# Patient Record
Sex: Female | Born: 1980 | Race: Black or African American | Hispanic: No | Marital: Married | Smoking: Never smoker
Health system: Southern US, Community
[De-identification: ages and names within clinical notes are randomized; demographics above are authoritative.]

## PROBLEM LIST (undated history)

## (undated) ENCOUNTER — Inpatient Hospital Stay (HOSPITAL_COMMUNITY): Payer: Self-pay

## (undated) DIAGNOSIS — Z789 Other specified health status: Secondary | ICD-10-CM

## (undated) DIAGNOSIS — N883 Incompetence of cervix uteri: Secondary | ICD-10-CM

## (undated) DIAGNOSIS — O139 Gestational [pregnancy-induced] hypertension without significant proteinuria, unspecified trimester: Secondary | ICD-10-CM

## (undated) DIAGNOSIS — D563 Thalassemia minor: Secondary | ICD-10-CM

## (undated) DIAGNOSIS — D649 Anemia, unspecified: Secondary | ICD-10-CM

## (undated) DIAGNOSIS — O009 Unspecified ectopic pregnancy without intrauterine pregnancy: Secondary | ICD-10-CM

## (undated) DIAGNOSIS — O039 Complete or unspecified spontaneous abortion without complication: Secondary | ICD-10-CM

## (undated) HISTORY — PX: TUBAL LIGATION: SHX77

## (undated) HISTORY — PX: WISDOM TOOTH EXTRACTION: SHX21

## (undated) HISTORY — PX: ABDOMINOPLASTY: SUR9

## (undated) HISTORY — PX: OTHER SURGICAL HISTORY: SHX169

## (undated) HISTORY — DX: Complete or unspecified spontaneous abortion without complication: O03.9

---
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
## 2000-02-18 ENCOUNTER — Inpatient Hospital Stay (HOSPITAL_COMMUNITY): Admission: AD | Admit: 2000-02-18 | Discharge: 2000-02-18 | Payer: Self-pay | Admitting: *Deleted

## 2000-02-27 ENCOUNTER — Inpatient Hospital Stay (HOSPITAL_COMMUNITY): Admission: AD | Admit: 2000-02-27 | Discharge: 2000-02-27 | Payer: Self-pay | Admitting: *Deleted

## 2000-03-13 ENCOUNTER — Ambulatory Visit (HOSPITAL_COMMUNITY): Admission: RE | Admit: 2000-03-13 | Discharge: 2000-03-13 | Payer: Self-pay | Admitting: *Deleted

## 2000-03-13 ENCOUNTER — Encounter: Payer: Self-pay | Admitting: *Deleted

## 2000-03-29 ENCOUNTER — Inpatient Hospital Stay (HOSPITAL_COMMUNITY): Admission: AD | Admit: 2000-03-29 | Discharge: 2000-03-29 | Payer: Self-pay | Admitting: *Deleted

## 2000-05-21 ENCOUNTER — Inpatient Hospital Stay (HOSPITAL_COMMUNITY): Admission: AD | Admit: 2000-05-21 | Discharge: 2000-05-21 | Payer: Self-pay | Admitting: *Deleted

## 2000-05-22 ENCOUNTER — Ambulatory Visit (HOSPITAL_COMMUNITY): Admission: RE | Admit: 2000-05-22 | Discharge: 2000-05-22 | Payer: Self-pay | Admitting: *Deleted

## 2000-05-22 ENCOUNTER — Encounter: Payer: Self-pay | Admitting: *Deleted

## 2000-07-18 ENCOUNTER — Encounter (INDEPENDENT_AMBULATORY_CARE_PROVIDER_SITE_OTHER): Payer: Self-pay | Admitting: Specialist

## 2000-07-18 ENCOUNTER — Inpatient Hospital Stay (HOSPITAL_COMMUNITY): Admission: AD | Admit: 2000-07-18 | Discharge: 2000-07-22 | Payer: Self-pay | Admitting: *Deleted

## 2000-07-18 DIAGNOSIS — O321XX0 Maternal care for breech presentation, not applicable or unspecified: Secondary | ICD-10-CM

## 2001-01-20 ENCOUNTER — Inpatient Hospital Stay (HOSPITAL_COMMUNITY): Admission: AD | Admit: 2001-01-20 | Discharge: 2001-01-20 | Payer: Self-pay | Admitting: *Deleted

## 2001-06-17 ENCOUNTER — Emergency Department (HOSPITAL_COMMUNITY): Admission: EM | Admit: 2001-06-17 | Discharge: 2001-06-17 | Payer: Self-pay | Admitting: Emergency Medicine

## 2001-06-17 ENCOUNTER — Encounter: Payer: Self-pay | Admitting: Emergency Medicine

## 2001-09-13 ENCOUNTER — Emergency Department (HOSPITAL_COMMUNITY): Admission: EM | Admit: 2001-09-13 | Discharge: 2001-09-13 | Payer: Self-pay

## 2002-06-28 ENCOUNTER — Emergency Department (HOSPITAL_COMMUNITY): Admission: EM | Admit: 2002-06-28 | Discharge: 2002-06-28 | Payer: Self-pay | Admitting: Emergency Medicine

## 2003-03-29 ENCOUNTER — Inpatient Hospital Stay (HOSPITAL_COMMUNITY): Admission: AD | Admit: 2003-03-29 | Discharge: 2003-03-30 | Payer: Self-pay | Admitting: Obstetrics

## 2003-05-09 ENCOUNTER — Inpatient Hospital Stay (HOSPITAL_COMMUNITY): Admission: AD | Admit: 2003-05-09 | Discharge: 2003-05-09 | Payer: Self-pay | Admitting: Obstetrics

## 2003-05-10 ENCOUNTER — Inpatient Hospital Stay (HOSPITAL_COMMUNITY): Admission: AD | Admit: 2003-05-10 | Discharge: 2003-05-10 | Payer: Self-pay | Admitting: Obstetrics

## 2003-05-17 ENCOUNTER — Inpatient Hospital Stay (HOSPITAL_COMMUNITY): Admission: AD | Admit: 2003-05-17 | Discharge: 2003-05-17 | Payer: Self-pay | Admitting: Obstetrics

## 2003-06-04 ENCOUNTER — Inpatient Hospital Stay (HOSPITAL_COMMUNITY): Admission: AD | Admit: 2003-06-04 | Discharge: 2003-06-04 | Payer: Self-pay | Admitting: Obstetrics

## 2003-08-25 ENCOUNTER — Inpatient Hospital Stay (HOSPITAL_COMMUNITY): Admission: AD | Admit: 2003-08-25 | Discharge: 2003-08-25 | Payer: Self-pay | Admitting: Obstetrics

## 2003-09-03 ENCOUNTER — Inpatient Hospital Stay (HOSPITAL_COMMUNITY): Admission: AD | Admit: 2003-09-03 | Discharge: 2003-09-03 | Payer: Self-pay | Admitting: Obstetrics

## 2003-09-11 ENCOUNTER — Encounter (INDEPENDENT_AMBULATORY_CARE_PROVIDER_SITE_OTHER): Payer: Self-pay | Admitting: Specialist

## 2003-09-11 ENCOUNTER — Inpatient Hospital Stay (HOSPITAL_COMMUNITY): Admission: AD | Admit: 2003-09-11 | Discharge: 2003-09-13 | Payer: Self-pay | Admitting: Obstetrics

## 2004-03-07 ENCOUNTER — Inpatient Hospital Stay (HOSPITAL_COMMUNITY): Admission: AD | Admit: 2004-03-07 | Discharge: 2004-03-07 | Payer: Self-pay | Admitting: Obstetrics

## 2004-07-07 ENCOUNTER — Inpatient Hospital Stay (HOSPITAL_COMMUNITY): Admission: AD | Admit: 2004-07-07 | Discharge: 2004-07-07 | Payer: Self-pay | Admitting: Obstetrics

## 2004-07-08 ENCOUNTER — Inpatient Hospital Stay (HOSPITAL_COMMUNITY): Admission: AD | Admit: 2004-07-08 | Discharge: 2004-07-08 | Payer: Self-pay | Admitting: Obstetrics

## 2007-02-03 ENCOUNTER — Inpatient Hospital Stay (HOSPITAL_COMMUNITY): Admission: AD | Admit: 2007-02-03 | Discharge: 2007-02-03 | Payer: Self-pay | Admitting: Obstetrics

## 2010-06-21 ENCOUNTER — Encounter: Admission: RE | Admit: 2010-06-21 | Discharge: 2010-06-21 | Payer: Self-pay | Admitting: General Surgery

## 2010-12-22 NOTE — Discharge Summary (Signed)
NAME:  Rebecca Webster, Rebecca Webster                        ACCOUNT NO.:  [id number]   MEDICAL RECORD NO.:  [id number]                   PATIENT TYPE:  INP   LOCATION:  9147                                 FACILITY:  [facility]   PHYSICIAN:  Kathreen Cosier, M.D.           DATE OF BIRTH:  01-Feb-1981   DATE OF ADMISSION:  09/11/2003  DATE OF DISCHARGE:  09/13/2003                                 DISCHARGE SUMMARY   Patient is a 30 year old gravida 5 para 2-2-0-4 (EDC September 10, 2003).  She  was in brought in for elective induction at term.  She has a negative GBS.  Her cervix was 2 cm, 60%, vertex, -2 on admission and she had a C-section in  the past for twins.  Patient had a normal labor and normal vaginal delivery  of a female, Apgars 9 and 9.  She desired sterilization and underwent  sterilization on September 12, 2003.  She did well and was discharged home on  September 13, 2003 ambulatory, on a regular diet, hemoglobin 10.1, she was  discharged on Tylox for pain, to see me in 6 weeks.   DISCHARGE DIAGNOSIS:  Status post normal vaginal delivery post induction and  postpartum tubal ligation.                                               Kathreen Cosier, M.D.    BAM/MEDQ  D:  09/13/2003  T:  09/13/2003  Job:  [id number]

## 2010-12-22 NOTE — Op Note (Signed)
[facility]  Patient:    Rebecca Webster, Rebecca Webster                       MRN: [id number] Adm. Date:  [id number] Attending:  Deniece Ree                           Operative Report  PREOPERATIVE DIAGNOSIS:           Intrauterine pregnancy approximately 33 to [redacted] weeks gestation, twins, in active unstoppable labor with first infant being breech, second infant being in a transverse lie.  POSTOPERATIVE DIAGNOSIS:          Intrauterine pregnancy approximately 33 to [redacted] weeks gestation, twins, in active unstoppable labor with first infant being breech, second infant being in a transverse lie.  Viable twin A with an Apgar of 8 and 9 and twin B with an Apgar of 9 and 9.  Twin A was female and twin B was female.  OPERATION:                        Primary low transverse cervical cesarean section.  SURGEON:                          Deniece Ree, M.D.  ASSISTANT:                        Kathreen Cosier, M.D.  ANESTHESIA:                       Epidural by  Dr. ____________  Pediatric teaching service.  ESTIMATED BLOOD LOSS:             500 cc.  DRAINS:                           Foley catheter left to straight drainage.                                    The patient tolerated the procedure well and returned to the recovery room in satisfactory condition.  DESCRIPTION OF PROCEDURE:         The patient was taken to the operating room and prepped and draped in the usual fashion for a primary cesarean section.  A low Pfannenstiel incision was made.  This was carried down to the fascia at which time the fascia was entered and incised the extent of the incision. Midline identified and rectus muscles separated.  Abdominal peritoneum was then entered in the vertical fashion using Metzenbaum scissors.  The visceral peritoneum was incised bilaterally toward the round ligaments following which the lower uterine segment was scored, entered in the midline and  bluntly dissected open.  The right hand was introduced and in a breech delivery fashion, the infant was delivered without any problems.  The nose and pharynx was sucked out with a suction bulb.  Complete delivery was carried out.  The cord was clamped and the infant turned over to the pediatricians who were in attendance.  The second infant was somewhat difficult to deliver in that it was a fundal transverse lie.  However, with some manipulation, the amniotic sac was ruptured and with continuous manipulation, the infant was then  changed to a vertex presentation and delivered.  The nose and oropharynx were sucked out with the suction bulb.  At this point complete delivery was carried out without any problem.  Cord was clamped and the infant turned over the pediatricians who were in attendance.  Infant A was a female infant and infant B was a female.  Cord blood was then obtained from both placentas, labeled and sent to pathology.  The placenta as well as all products of conception were then manually removed from the uterine cavity, labeled and also sent to pathology.  After the uterus was cleaned manually, the myometrium was then closed with #1 chromic in a running locking stitch followed by a subcuticular stitch.  That was followed by an imbricating stitch, again using #1 chromic. Reperitonealization was then carried out using 2-0 chromic in a running stitch.  Sponge and needle count was correct x 2.  Hemostasis was present. Tubes and ovaries appeared to be within normal limits.  The abdominal peritoneum was then closed using 2-0 chromic in a running stitch followed by closure of the fascia using #1 Dexon in a running stitch.  The subcuticular stitch using 4-0 Vicryl was utilized to close the skin.  At this point the procedure was terminated.  The patient tolerated the procedure well and returned to the recovery room in satisfactory condition. DD:  07/18/00 TD:  07/18/00 Job:  [id number]

## 2010-12-22 NOTE — Discharge Summary (Signed)
[facility]  Patient:    Rebecca Webster, Rebecca Webster                       MRN: [id number] Adm. Date:  [id number] Disc. Date: [id number] Attending:  Deniece Ree                           Discharge Summary  DISCHARGE SUMMARY:            The patient was a 30 year old gravida 3, para 2, approximately 43 to [redacted] weeks gestation having twins. The patient was admitted in labor and started on magnesium sulfate for premature labor. The patient had been taking Terbutaline at home without any success. Contractions were continuing every three minutes. At the time her cervix was 3 cm dilated. The infant had a breech presentation. After the patient continued in this fashion, and unable to stop the labor, the patient was then scheduled for a cesarean section. Of the ultrasound, the first infant was breech and the second was transverse. On December 13 the patient underwent a primary low transverse cervical cesarean section at which time she had viable twins. The patient underwent the operative procedure very well without any problems.  Postoperatively, she did very well without any complications and was discharged on the fourth postoperative day. She was instructed on the possible complications and care following this type of surgery. She was told to return to my office in four weeks for followup evaluation or to call me prior to that time should any problems arise. DD:  09/04/00 TD:  09/04/00 Job: [id number]

## 2010-12-22 NOTE — Op Note (Signed)
NAME:  Rebecca Webster, Rebecca Webster                        ACCOUNT NO.:  [id number]   MEDICAL RECORD NO.:  [id number]                   PATIENT TYPE:  INP   LOCATION:  9147                                 FACILITY:  [facility]   PHYSICIAN:  Kathreen Cosier, M.D.           DATE OF BIRTH:  [birth year]   DATE OF PROCEDURE:  09/12/2003  DATE OF DISCHARGE:                                 OPERATIVE REPORT   PREOPERATIVE DIAGNOSIS:  Multiparity.   PROCEDURE:  Postpartum tubal ligation.   DESCRIPTION OF PROCEDURE:  The patient placed on the operating table in a  supine position; epidural was doses, abdomen prepped, draped, bladder  emptied with straight catheter.  Midline subumbilical incision 1 inch long  was made, carried down to the fascia.  Fascia was cleaned, grasped with 2  Kochers, and the fascia and the peritoneum opened with the Mayo scissors.  The left tube was grasped in the mid portion with Babcock clamp.  The tube  was traced to the fimbria.  A 0 plain suture was placed in the mesosalpinx  below the portion of the tube within the clamp.  This was tagged and  approximately 1 inch of tube transected.  Procedure done in exact fashion on  the other side.  Hemostasis satisfactory.  Lap and sponge counts correct.  Abdomen closed in layers.  Peritoneum and fascia continuous suture of 0  Dexon and the skin closed with subcuticular stitch of 4-0 Monocryl.  Blood  loss less than 5 mL.  The patient tolerated the procedure well, taken to the  recovery room in good condition.                                               Kathreen Cosier, M.D.    BAM/MEDQ  D:  09/12/2003  T:  09/12/2003  Job:  [id number]

## 2010-12-22 NOTE — H&P (Signed)
[facility]  Patient:    Rebecca Webster, Rebecca Webster                       MRN: [id number] Adm. Date:  [id number] Attending:  Deniece Ree                         History and Physical  HISTORY:                          The patient is a 30 year old gravida 3, para 2 approximately 38 to [redacted] weeks gestation with twins who was admitted because of uterine contractions.  Patient indicated they had been occurring for approximately six hours.  The patient had been previously taking terbutaline every four hours; however, no success with stopping the contractions at the present time.  At time of triage admission, the patient was having uterine contractions approximately every three to four minutes of very strong quality who was then admitted and placed on mag sulfate.  During the ensuing two hours, the patient had increasing uterine contractions of very strong quality without any change with the mag sulfate present.  Other than this, no other previous problem with a twin gestation.  PHYSICAL EXAMINATION:  GENERAL:                          The patient is a well-developed, well-nourished, gravid female in labor type distress.  HEENT:                            Within normal limits.  NECK:                             Supple.  BREASTS:                          Without masses, tenderness or discharge.  LUNGS:                            The lungs were clear to auscultation and percussion.  HEART:                            Normal sinus rhythm without murmurs, gallops or rubs.  ABDOMEN:                          Term size with good twin fetal heart tones.  EXTREMITIES:                      Within normal limits.  NEUROLOGIC:                       Within normal limits.  PELVIC:                           Cervix was 3 to 4 cm dilated, 90% effaced, a breach presentation at a -1 station.  An ultrasound was obtained which also revealed first baby to be a breech with the  second being a breech; however, in more of a transverse lie type position.  ADMISSION DIAGNOSES:  Intrauterine pregnancy twin gestation approximately 75 to [redacted] weeks gestation and unstoppable labor with a breech presentation.  Her plan is to proceed with a primary cesarean section. DD:  07/18/00 TD:  07/18/00 Job: [id number]

## 2011-05-23 LAB — WET PREP, GENITAL
Clue Cells Wet Prep HPF POC: NONE SEEN
Yeast Wet Prep HPF POC: NONE SEEN

## 2011-05-23 LAB — URINALYSIS, ROUTINE W REFLEX MICROSCOPIC
Bilirubin Urine: NEGATIVE
Glucose, UA: NEGATIVE
Hgb urine dipstick: NEGATIVE
Ketones, ur: NEGATIVE
Protein, ur: NEGATIVE
Specific Gravity, Urine: 1.01
Urobilinogen, UA: 0.2
pH: 6.5

## 2011-05-23 LAB — POCT PREGNANCY, URINE: Preg Test, Ur: NEGATIVE

## 2016-08-20 ENCOUNTER — Inpatient Hospital Stay (HOSPITAL_COMMUNITY)
Admission: AD | Admit: 2016-08-20 | Discharge: 2016-08-21 | Disposition: A | Discharge status: Home / Self Care | Payer: BLUE CROSS/BLUE SHIELD | Source: Ambulatory Visit | Attending: Family Medicine | Admitting: Family Medicine

## 2016-08-20 ENCOUNTER — Encounter (HOSPITAL_COMMUNITY): Payer: Self-pay | Admitting: *Deleted

## 2016-08-20 DIAGNOSIS — R102 Pelvic and perineal pain: Secondary | ICD-10-CM | POA: Diagnosis present

## 2016-08-20 DIAGNOSIS — N898 Other specified noninflammatory disorders of vagina: Secondary | ICD-10-CM | POA: Diagnosis not present

## 2016-08-20 DIAGNOSIS — Z3202 Encounter for pregnancy test, result negative: Secondary | ICD-10-CM | POA: Diagnosis not present

## 2016-08-20 DIAGNOSIS — N940 Mittelschmerz: Secondary | ICD-10-CM | POA: Diagnosis not present

## 2016-08-20 HISTORY — DX: Other specified health status: Z78.9

## 2016-08-20 LAB — WET PREP, GENITAL
CLUE CELLS WET PREP: NONE SEEN
Sperm: NONE SEEN
TRICH WET PREP: NONE SEEN
Yeast Wet Prep HPF POC: NONE SEEN

## 2016-08-20 LAB — URINALYSIS, ROUTINE W REFLEX MICROSCOPIC
Bilirubin Urine: NEGATIVE
Glucose, UA: NEGATIVE mg/dL
Hgb urine dipstick: NEGATIVE
KETONES UR: NEGATIVE mg/dL
LEUKOCYTES UA: NEGATIVE
Nitrite: NEGATIVE
PROTEIN: NEGATIVE mg/dL
Specific Gravity, Urine: 1.01 (ref 1.005–1.030)
pH: 7 (ref 5.0–8.0)

## 2016-08-20 LAB — POCT PREGNANCY, URINE: PREG TEST UR: NEGATIVE

## 2016-08-20 NOTE — MAU Provider Note (Signed)
History     CSN: [id number]  Arrival date and time: 08/20/16 2159   First Provider Initiated Contact with Patient 08/20/16 2328      Chief Complaint  Patient presents with  . Pelvic Pain   Pelvic Pain  The patient's primary symptoms include pelvic pain and vaginal discharge. This is a new problem. Episode onset: intermittently with cycle for about 4 months, but today it has been constant.  The problem has been gradually worsening. Pain severity now: 4/10  The problem affects the left side. She is not pregnant. Associated symptoms include vomiting (3x today ). Pertinent negatives include no chills, diarrhea, dysuria or fever. The vaginal discharge was watery and copious. There has been no bleeding. Exacerbated by: certain positions, leaning toward the left.  She has tried nothing for the symptoms. She is sexually active. Contraceptive use: had tubal reversal March 2017.  Her menstrual history has been regular (LMP: 08/10/16 ).   Past Medical History:  Diagnosis Date  . Medical history non-contributory     Past Surgical History:  Procedure Laterality Date  . abdominalplasty    . CESAREAN SECTION    . TUBAL LIGATION    . tubalr eversal      History reviewed. No pertinent family history.  Social History  Substance Use Topics  . Smoking status: Never Smoker  . Smokeless tobacco: Never Used  . Alcohol use No    Allergies: No Known Allergies  Prescriptions Prior to Admission  Medication Sig Dispense Refill Last Dose  . therapeutic multivitamin-minerals (THERAGRAN-M) tablet Take 1 tablet by mouth daily.   08/19/2016 at Unknown time    Review of Systems  Constitutional: Negative for chills and fever.  Gastrointestinal: Positive for vomiting (3x today ). Negative for diarrhea.  Genitourinary: Positive for pelvic pain and vaginal discharge. Negative for dysuria and vaginal bleeding.   Physical Exam   Blood pressure 133/84, pulse 97, temperature 99.9 F (37.7 C), temperature  source Oral, resp. rate 18, height 5' 10.5" (1.791 m), weight 209 lb (94.8 kg), last menstrual period 08/10/2016, SpO2 99 %.  Physical Exam  Nursing note and vitals reviewed. Constitutional: She is oriented to person, place, and time. She appears well-developed and well-nourished. No distress.  HENT:  Head: Normocephalic.  Cardiovascular: Normal rate.   Respiratory: Effort normal.  GI: Soft. There is no tenderness. There is no rebound.  Genitourinary:  Genitourinary Comments:  External: no lesion Vagina: normal  Cervix: pink, smooth, no CMT, copious egg white cervical mucous  Uterus: NSSC Adnexa: NT   Neurological: She is alert and oriented to person, place, and time.  Skin: Skin is warm and dry.  Psychiatric: She has a normal mood and affect.   Results for orders placed or performed during the hospital encounter of 08/20/16 (from the past 24 hour(s))  Urinalysis, Routine w reflex microscopic     Status: None   Collection Time: 08/20/16 10:52 PM  Result Value Ref Range   Color, Urine YELLOW YELLOW   APPearance CLEAR CLEAR   Specific Gravity, Urine 1.010 1.005 - 1.030   pH 7.0 5.0 - 8.0   Glucose, UA NEGATIVE NEGATIVE mg/dL   Hgb urine dipstick NEGATIVE NEGATIVE   Bilirubin Urine NEGATIVE NEGATIVE   Ketones, ur NEGATIVE NEGATIVE mg/dL   Protein, ur NEGATIVE NEGATIVE mg/dL   Nitrite NEGATIVE NEGATIVE   Leukocytes, UA NEGATIVE NEGATIVE  Pregnancy, urine POC     Status: None   Collection Time: 08/20/16 11:03 PM  Result Value Ref Range  Preg Test, Ur NEGATIVE NEGATIVE  Wet prep, genital     Status: Abnormal   Collection Time: 08/20/16 11:38 PM  Result Value Ref Range   Yeast Wet Prep HPF POC NONE SEEN NONE SEEN   Trich, Wet Prep NONE SEEN NONE SEEN   Clue Cells Wet Prep HPF POC NONE SEEN NONE SEEN   WBC, Wet Prep HPF POC FEW (A) NONE SEEN   Sperm NONE SEEN     MAU Course  Procedures  MDM   Assessment and Plan   1. Mittelschmerz phenomenon    DC home Comfort  measures reviewed  RX: none, OTC ibuprofen as needed  Return to MAU as needed FU with OB as planned  Follow-up Information    COUSINS,SHERONETTE A, MD Follow up.   Specialty:  Obstetrics and Gynecology Contact information: [address] [place] [address] [phone]            Tawnya Crook 08/20/2016, 11:30 PM

## 2016-08-20 NOTE — MAU Note (Signed)
Pt reports pain and pressure in her left lower quadrant. States the pain has been off/on for several months but for the last few days it has been constant. Also reports lower back pain and lower abd cramping , a headache and some vomiting for the last few days. Denies fever.

## 2016-08-21 DIAGNOSIS — N940 Mittelschmerz: Secondary | ICD-10-CM | POA: Diagnosis not present

## 2016-08-21 NOTE — Discharge Instructions (Signed)
Mittelschmerz Introduction Mittelschmerz is pain in the lower abdomen that is felt between periods.  The pain affects one side of the abdomen. The side that it affects may change from month to month.  The pain may be mild or severe.  The pain may last from minutes to hours. It does not last longer than 1-2 days.  The pain can happen with nausea and light vaginal bleeding. Mittelschmerz is common among women. It is caused by the growth and release of an egg from an ovary, and it is a natural part of the ovulation cycle. It often happens about two weeks after a woman's period ends. Follow these instructions at home: Pay attention to any changes in your condition. Take these actions to help with your pain:  Try soaking in a hot bath.  Take over-the-counter and prescription medicines only as told by your health care provider.  Keep all follow-up visits as told by your health care provider. This is important. Contact a health care provider if:  You have very bad pain most months.  You have abdominal pain that lasts longer than 24 hours.  Your pain medicine is not helping.  You have a fever.  You have nausea or vomiting that will not go away.  You miss your period.  You have vaginal bleeding between your periods that is heavier than spotting. This information is not intended to replace advice given to you by your health care provider. Make sure you discuss any questions you have with your health care provider. Document Released: 07/13/2002 Document Revised: 12/29/2015 Document Reviewed: 10/18/2014  2017 Elsevier

## 2016-08-23 LAB — GC/CHLAMYDIA PROBE AMP (~~LOC~~) NOT AT ARMC
Chlamydia: NEGATIVE
Neisseria Gonorrhea: NEGATIVE

## 2016-09-24 ENCOUNTER — Other Ambulatory Visit: Payer: Self-pay | Admitting: Obstetrics and Gynecology

## 2016-10-05 ENCOUNTER — Other Ambulatory Visit (HOSPITAL_COMMUNITY): Payer: Self-pay | Admitting: Obstetrics and Gynecology

## 2016-10-05 DIAGNOSIS — N979 Female infertility, unspecified: Secondary | ICD-10-CM

## 2016-10-10 ENCOUNTER — Ambulatory Visit (HOSPITAL_COMMUNITY)
Admission: RE | Admit: 2016-10-10 | Discharge: 2016-10-10 | Disposition: A | Discharge status: Home / Self Care | Payer: BLUE CROSS/BLUE SHIELD | Source: Ambulatory Visit | Attending: Obstetrics and Gynecology | Admitting: Obstetrics and Gynecology

## 2016-10-10 ENCOUNTER — Encounter (HOSPITAL_COMMUNITY): Payer: Self-pay | Admitting: Radiology

## 2016-10-10 DIAGNOSIS — N979 Female infertility, unspecified: Secondary | ICD-10-CM

## 2016-10-10 DIAGNOSIS — Z3141 Encounter for fertility testing: Secondary | ICD-10-CM | POA: Insufficient documentation

## 2016-10-10 DIAGNOSIS — Z9851 Tubal ligation status: Secondary | ICD-10-CM | POA: Diagnosis present

## 2016-10-10 MED ORDER — IOPAMIDOL (ISOVUE-300) INJECTION 61%
30.0000 mL | Freq: Once | INTRAVENOUS | Status: AC | PRN
  Administered 2016-10-10: 30 mL

## 2017-12-19 ENCOUNTER — Encounter (HOSPITAL_COMMUNITY): Payer: Self-pay | Admitting: *Deleted

## 2017-12-19 ENCOUNTER — Inpatient Hospital Stay (HOSPITAL_COMMUNITY)
Admission: AD | Admit: 2017-12-19 | Discharge: 2017-12-19 | Discharge status: Against Medical Advice | Payer: 59 | Source: Ambulatory Visit | Attending: Obstetrics and Gynecology | Admitting: Obstetrics and Gynecology

## 2017-12-19 DIAGNOSIS — O26859 Spotting complicating pregnancy, unspecified trimester: Secondary | ICD-10-CM | POA: Insufficient documentation

## 2017-12-19 DIAGNOSIS — Z3A01 Less than 8 weeks gestation of pregnancy: Secondary | ICD-10-CM | POA: Diagnosis not present

## 2017-12-19 DIAGNOSIS — O4691 Antepartum hemorrhage, unspecified, first trimester: Secondary | ICD-10-CM | POA: Diagnosis present

## 2017-12-19 LAB — URINALYSIS, ROUTINE W REFLEX MICROSCOPIC
Bilirubin Urine: NEGATIVE
GLUCOSE, UA: NEGATIVE mg/dL
Hgb urine dipstick: NEGATIVE
Ketones, ur: NEGATIVE mg/dL
LEUKOCYTES UA: NEGATIVE
Nitrite: NEGATIVE
PH: 6 (ref 5.0–8.0)
PROTEIN: NEGATIVE mg/dL
Specific Gravity, Urine: 1.004 — ABNORMAL LOW (ref 1.005–1.030)

## 2017-12-19 LAB — POCT PREGNANCY, URINE: Preg Test, Ur: POSITIVE — AB

## 2017-12-19 NOTE — MAU Note (Signed)
Pt stated she started spotting yesterday thought it stopped today but then started having some more abut 30 min ago. Denies any pain or cramping. La intercourse yesterday.

## 2017-12-20 ENCOUNTER — Encounter (HOSPITAL_COMMUNITY): Payer: Self-pay | Admitting: *Deleted

## 2017-12-20 ENCOUNTER — Inpatient Hospital Stay (HOSPITAL_COMMUNITY)
Admission: AD | Admit: 2017-12-20 | Discharge: 2017-12-20 | Disposition: A | Discharge status: Home / Self Care | Payer: 59 | Source: Ambulatory Visit | Attending: Obstetrics and Gynecology | Admitting: Obstetrics and Gynecology

## 2017-12-20 ENCOUNTER — Other Ambulatory Visit: Payer: Self-pay | Admitting: Obstetrics and Gynecology

## 2017-12-20 DIAGNOSIS — O009 Unspecified ectopic pregnancy without intrauterine pregnancy: Secondary | ICD-10-CM | POA: Diagnosis not present

## 2017-12-20 LAB — CBC WITH DIFFERENTIAL/PLATELET
Basophils Absolute: 0 10*3/uL (ref 0.0–0.1)
Basophils Relative: 0 %
EOS PCT: 1 %
Eosinophils Absolute: 0.1 10*3/uL (ref 0.0–0.7)
HCT: 39.2 % (ref 36.0–46.0)
Hemoglobin: 12.4 g/dL (ref 12.0–15.0)
LYMPHS PCT: 37 %
Lymphs Abs: 2.5 10*3/uL (ref 0.7–4.0)
MCH: 23.4 pg — ABNORMAL LOW (ref 26.0–34.0)
MCHC: 31.6 g/dL (ref 30.0–36.0)
MCV: 74 fL — AB (ref 78.0–100.0)
MONO ABS: 0.2 10*3/uL (ref 0.1–1.0)
Monocytes Relative: 4 %
Neutro Abs: 3.8 10*3/uL (ref 1.7–7.7)
Neutrophils Relative %: 58 %
PLATELETS: 221 10*3/uL (ref 150–400)
RBC: 5.3 MIL/uL — ABNORMAL HIGH (ref 3.87–5.11)
RDW: 15.4 % (ref 11.5–15.5)
WBC: 6.6 10*3/uL (ref 4.0–10.5)

## 2017-12-20 LAB — ABO/RH: ABO/RH(D): O POS

## 2017-12-20 LAB — CREATININE, SERUM
Creatinine, Ser: 0.59 mg/dL (ref 0.44–1.00)
GFR calc non Af Amer: >60 mL/min (ref >60)

## 2017-12-20 LAB — TYPE AND SCREEN
ABO/RH(D): O POS
Antibody Screen: NEGATIVE

## 2017-12-20 LAB — AST: AST: 18 U/L (ref 15–41)

## 2017-12-20 LAB — BUN: BUN: 9 mg/dL (ref 6–20)

## 2017-12-20 MED ORDER — METHOTREXATE INJECTION FOR WOMEN'S HOSPITAL
50.0000 mg/m2 | Freq: Once | INTRAMUSCULAR | Status: AC
  Administered 2017-12-20: 110 mg via INTRAMUSCULAR
  Filled 2017-12-20: qty 2.2

## 2017-12-20 NOTE — Progress Notes (Signed)
Dr Billy Coast called and message left that labs all wnl and request MD to call back to ok giving mtx

## 2017-12-20 NOTE — MAU Note (Signed)
Dr Cherly Hensen sent me in for methotrexate for ectopic. Denies pain and no bleeding. MTX info sheet given

## 2017-12-20 NOTE — MAU Note (Signed)
MTX given and questions answered. Pt to lobby to wait and then d/c home with significant other. Will call office Monday am for f/u appt in office. To return to MAU over wkend for any concerns. Understanding voiced

## 2017-12-20 NOTE — Progress Notes (Signed)
Dr Billy Coast called in to MAU and aware of lab results. Order given to give MTX

## 2018-11-13 ENCOUNTER — Encounter (HOSPITAL_COMMUNITY): Payer: Self-pay

## 2019-05-05 ENCOUNTER — Encounter (HOSPITAL_COMMUNITY): Payer: Self-pay | Admitting: *Deleted

## 2019-05-05 ENCOUNTER — Inpatient Hospital Stay (HOSPITAL_COMMUNITY)
Admission: AD | Admit: 2019-05-05 | Discharge: 2019-05-09 | DRG: 817 | Disposition: A | Discharge status: Home / Self Care | Payer: 59 | Attending: Obstetrics and Gynecology | Admitting: Obstetrics and Gynecology

## 2019-05-05 ENCOUNTER — Other Ambulatory Visit: Payer: Self-pay

## 2019-05-05 DIAGNOSIS — O34219 Maternal care for unspecified type scar from previous cesarean delivery: Secondary | ICD-10-CM | POA: Diagnosis present

## 2019-05-05 DIAGNOSIS — O4290 Premature rupture of membranes, unspecified as to length of time between rupture and onset of labor, unspecified weeks of gestation: Secondary | ICD-10-CM

## 2019-05-05 DIAGNOSIS — Z3A18 18 weeks gestation of pregnancy: Secondary | ICD-10-CM

## 2019-05-05 DIAGNOSIS — O3432 Maternal care for cervical incompetence, second trimester: Principal | ICD-10-CM | POA: Diagnosis present

## 2019-05-05 DIAGNOSIS — U071 COVID-19: Secondary | ICD-10-CM | POA: Diagnosis present

## 2019-05-05 DIAGNOSIS — O98512 Other viral diseases complicating pregnancy, second trimester: Secondary | ICD-10-CM | POA: Diagnosis present

## 2019-05-05 LAB — WET PREP, GENITAL
Clue Cells Wet Prep HPF POC: NONE SEEN
Sperm: NONE SEEN
Trich, Wet Prep: NONE SEEN
Yeast Wet Prep HPF POC: NONE SEEN

## 2019-05-05 LAB — CBC WITH DIFFERENTIAL/PLATELET
Abs Immature Granulocytes: 0.35 10*3/uL — ABNORMAL HIGH (ref 0.00–0.07)
Basophils Absolute: 0 10*3/uL (ref 0.0–0.1)
Basophils Relative: 0 %
Eosinophils Absolute: 0.1 10*3/uL (ref 0.0–0.5)
Eosinophils Relative: 1 %
HCT: 35.1 % — ABNORMAL LOW (ref 36.0–46.0)
Hemoglobin: 11.4 g/dL — ABNORMAL LOW (ref 12.0–15.0)
Immature Granulocytes: 5 %
Lymphocytes Relative: 27 %
Lymphs Abs: 2.1 10*3/uL (ref 0.7–4.0)
MCH: 23.9 pg — ABNORMAL LOW (ref 26.0–34.0)
MCHC: 32.5 g/dL (ref 30.0–36.0)
MCV: 73.7 fL — ABNORMAL LOW (ref 80.0–100.0)
Monocytes Absolute: 0.7 10*3/uL (ref 0.1–1.0)
Monocytes Relative: 8 %
Neutro Abs: 4.5 10*3/uL (ref 1.7–7.7)
Neutrophils Relative %: 59 %
Platelets: 202 10*3/uL (ref 150–400)
RBC: 4.76 MIL/uL (ref 3.87–5.11)
RDW: 15 % (ref 11.5–15.5)
WBC: 7.7 10*3/uL (ref 4.0–10.5)
nRBC: 0 % (ref 0.0–0.2)

## 2019-05-05 LAB — URINALYSIS, ROUTINE W REFLEX MICROSCOPIC
Bilirubin Urine: NEGATIVE
Glucose, UA: NEGATIVE mg/dL
Hgb urine dipstick: NEGATIVE
Ketones, ur: NEGATIVE mg/dL
Leukocytes,Ua: NEGATIVE
Nitrite: NEGATIVE
Protein, ur: NEGATIVE mg/dL
Specific Gravity, Urine: 1.011 (ref 1.005–1.030)
pH: 6 (ref 5.0–8.0)

## 2019-05-05 LAB — TYPE AND SCREEN
ABO/RH(D): O POS
Antibody Screen: NEGATIVE

## 2019-05-05 LAB — GROUP B STREP BY PCR: Group B strep by PCR: NEGATIVE

## 2019-05-05 LAB — ABO/RH: ABO/RH(D): O POS

## 2019-05-05 MED ORDER — SODIUM CHLORIDE 0.9 % IV SOLN
2.0000 g | Freq: Four times a day (QID) | INTRAVENOUS | Status: DC
  Administered 2019-05-06 (×2): 2 g via INTRAVENOUS
  Filled 2019-05-05 (×2): qty 2000

## 2019-05-05 MED ORDER — DEXTROSE IN LACTATED RINGERS 5 % IV SOLN
INTRAVENOUS | Status: DC
  Administered 2019-05-05 – 2019-05-06 (×3): via INTRAVENOUS

## 2019-05-05 MED ORDER — ZOLPIDEM TARTRATE 5 MG PO TABS: 5.0000 mg | ORAL_TABLET | Freq: Every evening | ORAL | Status: DC | PRN

## 2019-05-05 MED ORDER — CALCIUM CARBONATE ANTACID 500 MG PO CHEW: 2.0000 | CHEWABLE_TABLET | ORAL | Status: DC | PRN

## 2019-05-05 MED ORDER — PRENATAL MULTIVITAMIN CH
1.0000 | ORAL_TABLET | Freq: Every day | ORAL | Status: DC
  Administered 2019-05-07 – 2019-05-08 (×2): 1 via ORAL
  Filled 2019-05-05 (×2): qty 1

## 2019-05-05 MED ORDER — ACETAMINOPHEN 325 MG PO TABS: 650.0000 mg | ORAL_TABLET | ORAL | Status: DC | PRN

## 2019-05-05 MED ORDER — INDOMETHACIN 50 MG RE SUPP
50.0000 mg | Freq: Once | RECTAL | Status: DC
  Filled 2019-05-05: qty 1

## 2019-05-05 MED ORDER — DOCUSATE SODIUM 100 MG PO CAPS
100.0000 mg | ORAL_CAPSULE | Freq: Every day | ORAL | Status: DC
  Administered 2019-05-07 – 2019-05-09 (×3): 100 mg via ORAL
  Filled 2019-05-05 (×3): qty 1

## 2019-05-05 NOTE — H&P (Signed)
Rebecca Webster is a 38 y.o. female presenting @ 18 2/[redacted] wks gestation with cervical incompetence noted on evaluation in MAU for complaint of watery vaginal discharge that become yellow today. Denies abdominal pain or cramping. Denies urinary sx. ? Hx years ago with cryo. OB History    Gravida  6   Para  4   Term  2   Preterm  2   AB  1   Living  5     SAB      TAB      Ectopic  1   Multiple      Live Births  5          Past Medical History:  Diagnosis Date  . Medical history non-contributory    Past Surgical History:  Procedure Laterality Date  . abdominalplasty    . CESAREAN SECTION    . TUBAL LIGATION    . tubalr eversal     Family History: family history is not on file. Social History:  reports that she has never smoked. She has never used smokeless tobacco. She reports that she does not drink alcohol or use drugs.     Maternal Diabetes: No Genetic Screening: Normal Maternal Ultrasounds/Referrals: Other:not done as yet Fetal Ultrasounds or other Referrals:  None Maternal Substance Abuse:  No Significant Maternal Medications:  None Significant Maternal Lab Results:  None Other Comments:  pevious PTB due to twins/ C/S  Review of Systems  All other systems reviewed and are negative.  History   Blood pressure 134/77, pulse 88, temperature 98.6 F (37 C), temperature source Oral, resp. rate 20, height 5\' 10"  (1.778 m), weight 108.7 kg, last menstrual period 12/28/2018, unknown if currently breastfeeding. Exam Physical Exam  Constitutional: She is oriented to person, place, and time. She appears well-developed and well-nourished.  Eyes: EOM are normal.  Neck: Neck supple.  Cardiovascular: Normal rate.  Respiratory: Effort normal.  GI: Soft. There is no abdominal tenderness.  Gravid~18 wk Tummy tuck  Genitourinary:    Genitourinary Comments: Scant clump yellow mucoid d/c Cervix with prolapsing membrane through ~1 cm dilation of cervix Uterus  gravid nontender 2 FB below umb   Musculoskeletal:        General: No edema.  Neurological: She is alert and oriented to person, place, and time.  Skin: Skin is warm and dry.    Prenatal labs: ABO, Rh:  O positive Antibody:  neg Rubella:  Immune  RPR:   NR HBsAg:   neg HIV:   neg GBS:   pending Wet prep done Assessment/Plan: Cervical incompetence IUP@ 18 2/7 weeks P) admit cbc, u/a, ucx. GBS cx . Formal CUS in am with MFM( r/o any fetal anomaly), rescue cervical cerclage. Monitor for ctx. Indocin on call to OR. Ampicillin on call to surgery. Bedrest with BRP. Reviewed risk of cerclage placement with pt and husband( infection, bleeding, ROM, inability to achieve viability, PTB)   Rebecca Webster A Rebecca Webster 05/05/2019, 10:56 PM

## 2019-05-05 NOTE — MAU Note (Signed)
PT SAYS SINCE Sunday - INCREASE IN VAG D/C.   TODAY WAS THICK YELLOW  THIS AM AND AFTERNOON.  SHE CALLED  DR COUSINS OFFICE -  GOT ANSWERING SERVICE.   NO PAIN.

## 2019-05-06 ENCOUNTER — Inpatient Hospital Stay (HOSPITAL_COMMUNITY): Payer: 59

## 2019-05-06 ENCOUNTER — Inpatient Hospital Stay (HOSPITAL_COMMUNITY): Payer: 59 | Admitting: Anesthesiology

## 2019-05-06 ENCOUNTER — Encounter (HOSPITAL_COMMUNITY)
Admission: AD | Disposition: A | Discharge status: Home / Self Care | Payer: Self-pay | Source: Home / Self Care | Attending: Obstetrics and Gynecology

## 2019-05-06 ENCOUNTER — Encounter (HOSPITAL_COMMUNITY): Payer: Self-pay | Admitting: Anesthesiology

## 2019-05-06 DIAGNOSIS — Z3A18 18 weeks gestation of pregnancy: Secondary | ICD-10-CM | POA: Diagnosis not present

## 2019-05-06 DIAGNOSIS — O09212 Supervision of pregnancy with history of pre-term labor, second trimester: Secondary | ICD-10-CM | POA: Diagnosis not present

## 2019-05-06 DIAGNOSIS — O09522 Supervision of elderly multigravida, second trimester: Secondary | ICD-10-CM | POA: Diagnosis not present

## 2019-05-06 DIAGNOSIS — U071 COVID-19: Secondary | ICD-10-CM | POA: Diagnosis present

## 2019-05-06 DIAGNOSIS — O3432 Maternal care for cervical incompetence, second trimester: Secondary | ICD-10-CM | POA: Diagnosis present

## 2019-05-06 DIAGNOSIS — O98512 Other viral diseases complicating pregnancy, second trimester: Secondary | ICD-10-CM | POA: Diagnosis present

## 2019-05-06 DIAGNOSIS — O34219 Maternal care for unspecified type scar from previous cesarean delivery: Secondary | ICD-10-CM | POA: Diagnosis present

## 2019-05-06 HISTORY — PX: CERVICAL CERCLAGE: SHX1329

## 2019-05-06 LAB — SARS CORONAVIRUS 2 BY RT PCR (HOSPITAL ORDER, PERFORMED IN ~~LOC~~ HOSPITAL LAB): SARS Coronavirus 2: POSITIVE — AB

## 2019-05-06 SURGERY — CERCLAGE, CERVIX, VAGINAL APPROACH
Anesthesia: Spinal | Site: Vagina | Wound class: Clean Contaminated

## 2019-05-06 MED ORDER — FAMOTIDINE IN NACL 20-0.9 MG/50ML-% IV SOLN
20.0000 mg | Freq: Once | INTRAVENOUS | Status: AC
  Administered 2019-05-06: 20 mg via INTRAVENOUS
  Filled 2019-05-06: qty 50

## 2019-05-06 MED ORDER — ONDANSETRON HCL 4 MG/2ML IJ SOLN
4.0000 mg | Freq: Once | INTRAMUSCULAR | Status: DC
  Filled 2019-05-06: qty 2

## 2019-05-06 MED ORDER — INDOMETHACIN 25 MG PO CAPS
25.0000 mg | ORAL_CAPSULE | Freq: Four times a day (QID) | ORAL | Status: AC
  Administered 2019-05-06 – 2019-05-08 (×8): 25 mg via ORAL
  Filled 2019-05-06 (×8): qty 1

## 2019-05-06 MED ORDER — CYCLOBENZAPRINE HCL 10 MG PO TABS
10.0000 mg | ORAL_TABLET | Freq: Three times a day (TID) | ORAL | Status: DC
  Administered 2019-05-06 – 2019-05-09 (×8): 10 mg via ORAL
  Filled 2019-05-06 (×8): qty 1

## 2019-05-06 MED ORDER — FENTANYL CITRATE (PF) 100 MCG/2ML IJ SOLN
INTRAMUSCULAR | Status: AC
  Filled 2019-05-06: qty 2

## 2019-05-06 MED ORDER — PROGESTERONE MICRONIZED 200 MG PO CAPS
200.0000 mg | ORAL_CAPSULE | Freq: Every day | ORAL | Status: DC
  Administered 2019-05-07 – 2019-05-09 (×3): 200 mg via VAGINAL
  Filled 2019-05-06 (×3): qty 1

## 2019-05-06 MED ORDER — ACETAMINOPHEN 160 MG/5ML PO SOLN: 325.0000 mg | Freq: Once | ORAL | Status: DC | PRN

## 2019-05-06 MED ORDER — INDOMETHACIN 50 MG RE SUPP
RECTAL | Status: DC | PRN
  Administered 2019-05-06: 50 mg via RECTAL

## 2019-05-06 MED ORDER — MEPERIDINE HCL 25 MG/ML IJ SOLN: 6.2500 mg | INTRAMUSCULAR | Status: DC | PRN

## 2019-05-06 MED ORDER — SOD CITRATE-CITRIC ACID 500-334 MG/5ML PO SOLN
30.0000 mL | Freq: Once | ORAL | Status: AC
  Administered 2019-05-06: 30 mL via ORAL
  Filled 2019-05-06: qty 30

## 2019-05-06 MED ORDER — ACETAMINOPHEN 10 MG/ML IV SOLN: 1000.0000 mg | Freq: Once | INTRAVENOUS | Status: DC | PRN

## 2019-05-06 MED ORDER — FENTANYL CITRATE (PF) 100 MCG/2ML IJ SOLN: 25.0000 ug | INTRAMUSCULAR | Status: DC | PRN

## 2019-05-06 MED ORDER — DEXTROSE IN LACTATED RINGERS 5 % IV SOLN
INTRAVENOUS | Status: DC
  Administered 2019-05-06: 21:00:00 via INTRAVENOUS

## 2019-05-06 MED ORDER — ONDANSETRON HCL 4 MG/2ML IJ SOLN
INTRAMUSCULAR | Status: AC
  Filled 2019-05-06: qty 2

## 2019-05-06 MED ORDER — LACTATED RINGERS IV SOLN: INTRAVENOUS | Status: DC

## 2019-05-06 MED ORDER — ACETAMINOPHEN 325 MG PO TABS: 325.0000 mg | ORAL_TABLET | Freq: Once | ORAL | Status: DC | PRN

## 2019-05-06 MED ORDER — ONDANSETRON HCL 4 MG/2ML IJ SOLN
INTRAMUSCULAR | Status: DC | PRN
  Administered 2019-05-06: 4 mg via INTRAVENOUS

## 2019-05-06 MED ORDER — LACTATED RINGERS IV SOLN
INTRAVENOUS | Status: DC | PRN
  Administered 2019-05-06: 17:00:00 via INTRAVENOUS

## 2019-05-06 MED ORDER — PROMETHAZINE HCL 25 MG/ML IJ SOLN: 6.2500 mg | INTRAMUSCULAR | Status: DC | PRN

## 2019-05-06 MED ORDER — INDOMETHACIN 50 MG RE SUPP
50.0000 mg | Freq: Once | RECTAL | Status: DC
  Filled 2019-05-06: qty 1

## 2019-05-06 MED ORDER — BUPIVACAINE IN DEXTROSE 0.75-8.25 % IT SOLN
INTRATHECAL | Status: DC | PRN
  Administered 2019-05-06: 1 mL via INTRATHECAL

## 2019-05-06 MED ORDER — FENTANYL CITRATE (PF) 100 MCG/2ML IJ SOLN
INTRAMUSCULAR | Status: DC | PRN
  Administered 2019-05-06: 15 ug via INTRATHECAL
  Administered 2019-05-06: 85 ug via INTRAVENOUS

## 2019-05-06 MED ORDER — POLYETHYLENE GLYCOL 3350 17 G PO PACK
17.0000 g | PACK | Freq: Every day | ORAL | Status: DC
  Administered 2019-05-06 – 2019-05-09 (×4): 17 g via ORAL
  Filled 2019-05-06 (×5): qty 1

## 2019-05-06 MED ORDER — SODIUM CHLORIDE 0.9 % IV SOLN
1.0000 g | Freq: Four times a day (QID) | INTRAVENOUS | Status: AC
  Administered 2019-05-06 – 2019-05-08 (×8): 1 g via INTRAVENOUS
  Filled 2019-05-06 (×9): qty 1000

## 2019-05-06 SURGICAL SUPPLY — 21 items
CANISTER SUCT 3000ML PPV (MISCELLANEOUS) ×3 IMPLANT
CATH FOLEY 2WAY SLVR 30CC 16FR (CATHETERS) ×6 IMPLANT
GLOVE BIOGEL PI IND STRL 7.0 (GLOVE) ×3 IMPLANT
GLOVE BIOGEL PI INDICATOR 7.0 (GLOVE) ×6
GLOVE ECLIPSE 6.5 STRL STRAW (GLOVE) ×3 IMPLANT
GOWN STRL REUS W/TWL LRG LVL3 (GOWN DISPOSABLE) ×6 IMPLANT
NS IRRIG 1000ML POUR BTL (IV SOLUTION) ×3 IMPLANT
PACK VAGINAL MINOR WOMEN LF (CUSTOM PROCEDURE TRAY) ×3 IMPLANT
PAD OB MATERNITY 4.3X12.25 (PERSONAL CARE ITEMS) ×3 IMPLANT
PAD PREP 24X48 CUFFED NSTRL (MISCELLANEOUS) ×3 IMPLANT
SUT ETHIBOND  5 (SUTURE) ×2
SUT ETHIBOND 5 (SUTURE) ×1 IMPLANT
SUT PROLENE 1 CTX 30  8455H (SUTURE) ×2
SUT PROLENE 1 CTX 30 8455H (SUTURE) ×1 IMPLANT
SYR 30ML LL (SYRINGE) ×2 IMPLANT
SYR BULB IRRIGATION 50ML (SYRINGE) ×3 IMPLANT
TOWEL OR 17X24 6PK STRL BLUE (TOWEL DISPOSABLE) ×6 IMPLANT
TRAY FOLEY W/BAG SLVR 14FR (SET/KITS/TRAYS/PACK) ×3 IMPLANT
TUBING NON-CON 1/4 X 20 CONN (TUBING) ×1 IMPLANT
TUBING NON-CON 1/4 X 20' CONN (TUBING) ×1
YANKAUER SUCT BULB TIP NO VENT (SUCTIONS) ×2 IMPLANT

## 2019-05-06 NOTE — Transfer of Care (Signed)
Immediate Anesthesia Transfer of Care Note  Patient: Rebecca Webster  Procedure(s) Performed: CERCLAGE CERVICAL (N/A Vagina )  Patient Location: PACU  Anesthesia Type:Spinal  Level of Consciousness: awake, alert  and oriented  Airway & Oxygen Therapy: Patient Spontanous Breathing  Post-op Assessment: Report given to RN and Post -op Vital signs reviewed and stable  Post vital signs: Reviewed and stable  Last Vitals:  Vitals Value Taken Time  BP    Temp    Pulse 78 05/06/19 1814  Resp 19 05/06/19 1814  SpO2 98 % 05/06/19 1814  Vitals shown include unvalidated device data.  Last Pain:  Vitals:   05/06/19 1555  TempSrc: Oral  PainSc:          Complications: No apparent anesthesia complications

## 2019-05-06 NOTE — Progress Notes (Signed)
Date and time results received: 05/06/19 0015   Test: COVID-19  Critical Value: Positive  Name of Provider Notified: Cousins  RN told pt and support person that pt is COVID positive. Explained PPE and need for support person to stay in room.

## 2019-05-06 NOTE — Anesthesia Preprocedure Evaluation (Addendum)
Anesthesia Evaluation  Patient identified by MRN, date of birth, ID band Patient awake    Reviewed: Allergy & Precautions, NPO status , Patient's Chart, lab work & pertinent test results  Airway Mallampati: II  TM Distance: >3 FB Neck ROM: Full    Dental  (+) Teeth Intact, Dental Advisory Given   Pulmonary neg pulmonary ROS,    breath sounds clear to auscultation       Cardiovascular negative cardio ROS   Rhythm:Regular Rate:Normal     Neuro/Psych negative neurological ROS  negative psych ROS   GI/Hepatic negative GI ROS, Neg liver ROS,   Endo/Other  negative endocrine ROS  Renal/GU negative Renal ROS     Musculoskeletal negative musculoskeletal ROS (+)   Abdominal   Peds  Hematology negative hematology ROS (+)   Anesthesia Other Findings   Reproductive/Obstetrics (+) Pregnancy                            Anesthesia Physical Anesthesia Plan  ASA: II  Anesthesia Plan: Spinal   Post-op Pain Management:    Induction:   PONV Risk Score and Plan: Ondansetron and Treatment may vary due to age or medical condition  Airway Management Planned:   Additional Equipment: None  Intra-op Plan:   Post-operative Plan:   Informed Consent: I have reviewed the patients History and Physical, chart, labs and discussed the procedure including the risks, benefits and alternatives for the proposed anesthesia with the patient or authorized representative who has indicated his/her understanding and acceptance.       Plan Discussed with: CRNA  Anesthesia Plan Comments: (Lab Results      Component                Value               Date                      WBC                      7.7                 05/05/2019                HGB                      11.4 (L)            05/05/2019                HCT                      35.1 (L)            05/05/2019                MCV                      73.7  (L)            05/05/2019                PLT                      202                 05/05/2019  COVID-19 Labs  No results for input(s): DDIMER, FERRITIN, LDH, CRP in the last 72 hours.  Lab Results      Component                Value               Date                      [id number]              POSITIVE (A)        05/05/2019            )       Anesthesia Quick Evaluation

## 2019-05-06 NOTE — Progress Notes (Signed)
HD #2 18 3/7 wk  S; no complaint except for the monitor belt uncomfortable Denies ctx/cramping  O: BP 139/78   Pulse 96   Temp 98.3 F (36.8 C) (Oral)   Resp 18   Ht 5\' 10"  (1.778 m)   Wt 108.7 kg   LMP 12/28/2018   SpO2 99%   BMI 34.38 kg/m  Labs reviewed with pt and partner: CBC    Component Value Date/Time   WBC 7.7 05/05/2019 2303   RBC 4.76 05/05/2019 2303   HGB 11.4 (L) 05/05/2019 2303   HCT 35.1 (L) 05/05/2019 2303   PLT 202 05/05/2019 2303   MCV 73.7 (L) 05/05/2019 2303   MCH 23.9 (L) 05/05/2019 2303   MCHC 32.5 05/05/2019 2303   RDW 15.0 05/05/2019 2303   LYMPHSABS 2.1 05/05/2019 2303   MONOABS 0.7 05/05/2019 2303   EOSABS 0.1 05/05/2019 2303   BASOSABS 0.0 05/05/2019 2303  Covid -19 positive( asymptomatic)  wet prep many wbc GBS cx negative  Toco: no ctx  sono done: results pending  IMP: cervical incompetence IUP@ 18 3/7 wk Covid-19 positive w/o sx P) rescue cerclage today. Consent signed

## 2019-05-06 NOTE — Anesthesia Procedure Notes (Signed)
Spinal  Start time: 05/06/2019 5:14 PM End time: 05/06/2019 5:16 PM Staffing Anesthesiologist: Effie Berkshire, MD Performed: anesthesiologist  Preanesthetic Checklist Completed: patient identified, site marked, surgical consent, pre-op evaluation, timeout performed, IV checked, risks and benefits discussed and monitors and equipment checked Spinal Block Patient position: sitting Prep: site prepped and draped and DuraPrep Location: L3-4 Injection technique: single-shot Needle Needle type: Pencan  Needle gauge: 24 G Needle length: 10 cm Needle insertion depth: 10 cm Additional Notes Patient tolerated well. No immediate complications.

## 2019-05-06 NOTE — Progress Notes (Signed)
Called to speak to pt regarding plan of care and need to not strain with BM. Spoke with nurse who reported that pt was complaining of low back pain ( 8/10). In speaking thereafter with the pt, she reports back pain prior to the surgery and hx back problems. Pt notes sx started since placed on bed rest. Advised pt to lay on sides and may use heating pad, tylenol for pain.will order trial of flexeril in the event of muscle spasms.

## 2019-05-06 NOTE — Brief Op Note (Signed)
05/06/2019  6:41 PM  PATIENT:  Rebecca Webster  38 y.o. female  PRE-OPERATIVE DIAGNOSIS:  incompetent cervix, IUP @ 18 3/7 weeks  POST-OPERATIVE DIAGNOSIS:  incompetent cervix, IUP @ 18 3/7 weeks  PROCEDURE:  Emergency Rescue McDonald cervical cerclage  SURGEON:  Surgeon(s) and Role:    * Servando Salina, MD - Primary  PHYSICIAN ASSISTANT:   ASSISTANTS: Derrell Lolling, CNM   ANESTHESIA:   spinal Findings. 4 cm dilated with bulging membrane( intact) EBL:  10 mL   BLOOD ADMINISTERED:none  DRAINS: none   LOCAL MEDICATIONS USED:  NONE  SPECIMEN:  No Specimen  DISPOSITION OF SPECIMEN:  N/A  COUNTS:  YES  TOURNIQUET:  * No tourniquets in log *  DICTATION: .Other Dictation: Dictation Number [id number]  PLAN OF CARE: Admit to inpatient   PATIENT DISPOSITION:  PACU - hemodynamically stable.   Delay start of Pharmacological VTE agent (>24hrs) due to surgical blood loss or risk of bleeding: no

## 2019-05-07 ENCOUNTER — Inpatient Hospital Stay (HOSPITAL_COMMUNITY): Payer: 59

## 2019-05-07 ENCOUNTER — Encounter (HOSPITAL_COMMUNITY): Payer: Self-pay | Admitting: Obstetrics and Gynecology

## 2019-05-07 DIAGNOSIS — O34219 Maternal care for unspecified type scar from previous cesarean delivery: Secondary | ICD-10-CM

## 2019-05-07 DIAGNOSIS — Z3A18 18 weeks gestation of pregnancy: Secondary | ICD-10-CM

## 2019-05-07 DIAGNOSIS — O99212 Obesity complicating pregnancy, second trimester: Secondary | ICD-10-CM

## 2019-05-07 DIAGNOSIS — O09522 Supervision of elderly multigravida, second trimester: Secondary | ICD-10-CM

## 2019-05-07 DIAGNOSIS — O09212 Supervision of pregnancy with history of pre-term labor, second trimester: Secondary | ICD-10-CM

## 2019-05-07 DIAGNOSIS — O3432 Maternal care for cervical incompetence, second trimester: Secondary | ICD-10-CM

## 2019-05-07 LAB — CULTURE, OB URINE: Culture: NO GROWTH

## 2019-05-07 NOTE — Op Note (Signed)
NAME: Rebecca Webster, Rebecca Webster MEDICAL RECORD [id number] ACCOUNT [id number] DATE OF BIRTH:09-20-1980 FACILITY: [facility] LOCATION: [facility] PHYSICIAN:Talisha Erby A. Taijah Macrae, MD  OPERATIVE REPORT  DATE OF PROCEDURE:  05/06/2019  PREOPERATIVE DIAGNOSIS:  Cervical incompetence, intrauterine gestation at 18 weeks and 3 days.  PROCEDURE:  Emergency rescue McDonald cerclage placement.  POSTOPERATIVE DIAGNOSIS:  Cervical incompetence, intrauterine gestation at 18 weeks and 3 days.  ANESTHESIA:  Spinal.  SURGEON:  Servando Salina, MD  ASSISTANTS:  Derrell Lolling, CNM.  DESCRIPTION OF PROCEDURE:  Under adequate spinal anesthesia, the patient was placed in the dorsal lithotomy position. Indocin suppository was placed in the rectum. Externally she was sterilely prepped and draped.  An indwelling Foley catheter was sterilely placed.  Weighted speculum was placed in  the vagina.  Sims retractor was placed anteriorly.  At that point, it was then noted that the cervix was about 4 cm with a bulging membrane. Particulate white discharge was noted without any odor.  The vagina was irrigated and suctioned of debris.  The anterior and posterior lip of the cervix was gently grasped with small clamps.  A 30 mL balloon was then utilized to decompress the protruding bulging membranes displacing it superiorly.  Starting at the  12 o'clock position a #1 Prolene was circumferentially placed until the 12 o'clock position was again reached.  At that point, an Ethibond suture was then placed to identify the knot with the placement of the surgical knot at the 12 o'clock position and slowly tying the knot while concomitantly deflating the balloon and removing it.  Ultimately , closure of the cervix with a tag of the Ethibond being placed at the 12 o'clock position was done.  The vagina was irrigated again and cervix was again  digitally palpated to be closed.  The procedure was then completed by removing all instruments from the  vagina.  SPECIMEN:  None.  ESTIMATED BLOOD LOSS:  Minimal.  COMPLICATIONS:  None.  DISPOSITION:  The patient was then transferred to recovery room in stable condition where fetal heart rate was checked.  TN/NUANCE  D:05/06/2019 T:05/07/2019 JOB:[id number]

## 2019-05-07 NOTE — Progress Notes (Signed)
HD #2 18 4/[redacted] weeks gestation  cervical incompetence Rescue cervical cerclage Had leakage of fluid in the wee hours of the morning. None since  S: feels fine. Back pain better  denies any further leakage (+) FM  O: T 98.6  BP 117/65 P89  Breast soft non tender Cor RRR Abd gravid soft nontender Uterus 2 FB below umb Pad no blood  extr no edema  IMP: Cervical incompetence S/p rescue cerclage IUP@ 18 4/7 wks Covid 19 positive P) remain inpt. Stress need to not strain w/ BM. Complete ampicillin x 48 hrs. Indocin x 48 hrs. Start prometrium today

## 2019-05-08 MED ORDER — GLYCERIN (LAXATIVE) 2.1 G RE SUPP
1.0000 | Freq: Once | RECTAL | Status: DC
  Filled 2019-05-08: qty 1

## 2019-05-08 NOTE — Anesthesia Postprocedure Evaluation (Signed)
Anesthesia Post Note  Patient: JENEAN ESCANDON  Procedure(s) Performed: CERCLAGE CERVICAL (N/A Vagina )     Patient location during evaluation: PACU Anesthesia Type: Spinal Level of consciousness: oriented and awake and alert Pain management: pain level controlled Vital Signs Assessment: post-procedure vital signs reviewed and stable Respiratory status: spontaneous breathing, respiratory function stable and patient connected to nasal cannula oxygen Cardiovascular status: blood pressure returned to baseline and stable Postop Assessment: no headache, no backache and no apparent nausea or vomiting Anesthetic complications: no    Last Vitals:  Vitals:   05/08/19 0540 05/08/19 0810  BP: (!) 86/47 (!) 105/59  Pulse: 86 83  Resp:  18  Temp: 36.7 C 37.1 C  SpO2: 96%     Last Pain:  Vitals:   05/08/19 0810  TempSrc: Oral  PainSc: 0-No pain                 Effie Berkshire

## 2019-05-08 NOTE — Progress Notes (Signed)
HD #3 18 5/[redacted] weeks gestation  cervical incompetence Rescue cervical cerclage  S: (+) FM  O: BP (!) 105/59   Pulse 83   Temp 98.7 F (37.1 C) (Oral)   Resp 18   Ht 5\' 10"  (1.778 m)   Wt 108.7 kg   LMP 12/28/2018   SpO2 96%   BMI 34.38 kg/m    Breast soft non tender Cor RRR Abd gravid soft nontender Uterus 2 FB below umb Pad no blood  extr no edema  IMP: Cervical incompetence S/p rescue cerclage IUP@ 18 5/7 wks Covid 19 positive Constipation P)glycerine suppository. Cont inpt. D/c home in am

## 2019-05-09 MED ORDER — PROGESTERONE MICRONIZED 200 MG PO CAPS: 200.0000 mg | ORAL_CAPSULE | Freq: Every day | ORAL | 9 refills | Status: DC

## 2019-05-09 MED ORDER — CYCLOBENZAPRINE HCL 10 MG PO TABS: 10.0000 mg | ORAL_TABLET | Freq: Three times a day (TID) | ORAL | 0 refills | Status: DC | PRN

## 2019-05-09 MED ORDER — POLYETHYLENE GLYCOL 3350 17 G PO PACK: 17.0000 g | PACK | Freq: Every day | ORAL | 11 refills | Status: DC

## 2019-05-09 NOTE — Progress Notes (Signed)
HD #4 18 6/[redacted] weeks gestation  cervical incompetence Rescue cervical cerclage  S: (+) FM. Had BM yesterday. Denies cramping/abdominal pain  O: BP 129/76 (BP Location: Right Arm)   Pulse 87   Temp 98.5 F (36.9 C) (Oral)   Resp 17   Ht 5\' 10"  (1.778 m)   Wt 108.7 kg   LMP 12/28/2018   SpO2 100%   BMI 34.38 kg/m  Breast soft non tender Cor RRR Abd gravid soft nontender Uterus 2 FB below umb Pad no blood  extr no edema Cervix closed / 1.5- 2 cm long palp ant suture  IMP: Cervical incompetence S/p rescue cerclage IUP@ 18 6/7 wks Covid 19 positive  P)d/c home. F/u 2 wk. Cont prometrium( vag). Cont bed rest

## 2019-05-09 NOTE — Progress Notes (Signed)
Discharge instructions and prescriptions given to pt. Reviewed signs and symptoms to report to the MD, cerclage after-care, upcoming appointments, and meds. Pt verbalizes understanding and has no questions or concerns at this time. Pt discharged from hospital in stable condition.

## 2019-05-09 NOTE — Discharge Summary (Signed)
Physician Discharge Summary  Patient ID: Rebecca Webster MRN: [id number] DOB/AGE: [birth year] 37 y.o.  Admit date: 05/05/2019 Discharge date: 05/09/2019  Admission Diagnoses: cervical incompetence, IUP @ 18 3/7 wk, Covid -19 positive  Discharge Diagnoses: same Active Problems:   Cervical incompetence affecting management of pregnancy in second trimester, antepartum   Discharged Condition: stable  Hospital Course: pt was seen [facility] initially for watery vaginal discharge. She was found to have prolapsing membrane and dilated cervix. She underwent fetal anatomy testing next day. Rescue cervical cerclage placement with a more advanced dilation contrary to admission. GBS cx neg. Covid-19 positive. Pt was and remained asx. She received IV ampicillin x 48 hr, indocin x 48 hrs and started on vaginal prometrium . Limited sono done for ? Leakage of fluid postop  Consults: None  Significant Diagnostic Studies: labs:  CBC Latest Ref Rng & Units 05/05/2019 12/20/2017  WBC 4.0 - 10.5 K/uL 7.7 6.6  Hemoglobin 12.0 - 15.0 g/dL 11.4(L) 12.4  Hematocrit 36.0 - 46.0 % 35.1(L) 39.2  Platelets 150 - 400 K/uL 202 221   and radiology: [place] Mfm Ob Detail +14 Wk  Result Date: 05/07/2019 ----------------------------------------------------------------------  OBSTETRICS REPORT                        (Signed Final 05/07/2019 07:12 am) ---------------------------------------------------------------------- Patient Info  ID #:       [id number]                          D.O.B.:  October 21, 1980 (37 yrs)  Name:       Rebecca Webster                Visit Date: 05/06/2019 07:42 am ---------------------------------------------------------------------- Performed By  Performed By:     Magnus Ivan           Ref. Address:      [facility]                                                              [facility]                                                              [facility]                                                               [address]  [place], [place]  Attending:        Sander Nephew      Secondary Phy.:    [facility]                    MD                                                              Care  Referred By:      Alanda Slim             Location[facility]          [facility]                    [name] MD                                [place] ---------------------------------------------------------------------- Orders   #  Description                          Code         Ordered By   1  [place] MFM OB DETAIL +14 [place]              [address].01     [name]  ----------------------------------------------------------------------   #  Order #                    Accession #                 Episode #   1  [id number]                  [id number]                  [id number]  ---------------------------------------------------------------------- Indications   Encounter for antenatal screening for          Z36.3   malformations   Cervical incompetence, second trimester        O34.32   Advanced maternal age multigravida 24+,        O72.522   second trimester   Poor obstetric history: Previous preterm       O09.219   delivery, antepartum (Twins)   H/O BTL with reversal   Previous cesarean delivery, antepartum         O34.219   Covid 19 positive   NIPS Low Risk female per patient   Obesity complicating pregnancy, second         O99.212   trimester  [redacted] weeks gestation of pregnancy                Z3A.18  ---------------------------------------------------------------------- Fetal Evaluation  Num Of Fetuses:          1  Preg. Location:          [address]  Cardiac Activity:        Observed  Presentation:            Variable  Placenta:                Posterior  P. Cord Insertion:       Visualized, central  Amniotic Fluid  AFI  FV:      Within normal limits                              Largest Pocket(cm)                              4.93 ---------------------------------------------------------------------- Biometry  BPD:      42.1  mm     G. Age:  18w 5d         65  %    CI:        70.16   %    70 - 86                                                          FL/HC:       17.0  %    15.8 - 18  HC:      160.3  mm     G. Age:  18w 6d         64  %    HC/AC:       1.07       1.07 - 1.29  AC:      149.2  mm     G. Age:  20w 1d         93  %    FL/BPD:      64.8  %  FL:       27.3  mm     G. Age:  18w 2d         40  %    FL/AC:       18.3  %    20 - 24  HUM:      27.9  mm     G. Age:  19w 0d         68  %  CER:      19.3  mm     G. Age:  18w 5d         58  %  NT:        3.4  mm  LV:        6.9  mm  Est. FW:     282   gm   0 lb 10 oz      90  % ---------------------------------------------------------------------- OB History  Gravidity:    6         Term:   2        Prem:   2  Ectopic:      1  Living:  5 ---------------------------------------------------------------------- Gestational Age  LMP:           18w 3d        Date:  12/28/18                 EDD:   10/04/19  U/S Today:     19w 0d                                        EDD:   09/30/19  Best:          18w 3d     Det. By:  LMP  (12/28/18)          EDD:   10/04/19 ---------------------------------------------------------------------- Anatomy  Cranium:               Appears normal         Aortic Arch:            Not well visualized  Cavum:                 Appears normal         Ductal Arch:            Not well visualized  Ventricles:            Appears normal         Diaphragm:              Not well visualized  Choroid Plexus:        Appears normal         Stomach:                Appears normal, left                                                                        sided  Cerebellum:            Appears normal         Abdomen:                Appears normal  Posterior Fossa:        Appears normal         Abdominal Wall:         Appears nml (cord                                                                        insert, abd wall)  Nuchal Fold:           Appears normal         Cord Vessels:           Appears normal (3  vessel cord)  Face:                  Appears normal         Kidneys:                Appear normal                         (orbits and profile)  Lips:                  Not well visualized    Bladder:                Appears normal  Palate:                Not well visualized    Spine:                  Appears normal  Heart:                 Appears normal         Upper Extremities:      Appears normal                         (4CH, axis, and                         situs)  RVOT:                  Not well visualized    Lower Extremities:      Appears normal  LVOT:                  Not well visualized  Other:  Fetus appears to be a female. Nasal bone visualized. Heels visualized.          5th digit visualized. Open hands visualized. ---------------------------------------------------------------------- Cervix Uterus Adnexa  Cervix  Known Open with bulging membranes ---------------------------------------------------------------------- Impression  Normal interval growth.  Cervical incompetence, fetal membranes appear to traverse  into the vagina  Low risk NIPS  Covid +  Suboptimal views of the fetal anatomy was obtained  secondary to fetal position. ---------------------------------------------------------------------- Recommendations  Consider follow up growth in 3-4 weeks to complete the fetal  anatomy  Clinical correlation recommended ----------------------------------------------------------------------               Lin Landsman, MD Electronically Signed Final Report   05/07/2019 07:12 am ----------------------------------------------------------------------   Treatments: surgery: rescue cervical  cerclage  Discharge Exam: Blood pressure 129/76, pulse 87, temperature 98.5 F (36.9 C), temperature source Oral, resp. rate 17, height  (1.778 m), weight 108.7 kg, last menstrual period 12/28/2018, SpO2 100 %, unknown if currently breastfeeding. General appearance: alert, cooperative and no distress Resp: clear to auscultation bilaterally Cardio: regular rate and rhythm, S1, S2 normal, no murmur, click, rub or gallop GI: gravid 18 wk Pelvic: external genitalia normal and cervix closed ~1.5-2 cm long palp ant sutures  Disposition: Discharge disposition: 01-Home or Self Care       Discharge Instructions    Bed rest   Complete by: As directed    Diet general   Complete by: As directed    Discharge instructions   Complete by: As directed    No intercourse. Shower no bath     Allergies as of 05/09/2019   No Known Allergies     Medication List  TAKE these medications   cyclobenzaprine 10 MG tablet Commonly known as: FLEXERIL Take 1 tablet (10 mg total) by mouth 3 (three) times daily as needed for muscle spasms.   polyethylene glycol 17 g packet Commonly known as: MIRALAX / GLYCOLAX Take 17 g by mouth daily.   prenatal multivitamin Tabs tablet Take 1 tablet by mouth daily at 12 noon.   progesterone 200 MG capsule Commonly known as: PROMETRIUM Place 1 capsule (200 mg total) vaginally daily.      Follow-up Information    Maxie Better, MD Follow up in 2 week(s).   Specialty: Obstetrics and Gynecology Contact information: [address] [place] [address] [phone]           Signed: Nena Jordan A Fahd Galea 05/09/2019, 10:20 AM

## 2019-05-09 NOTE — Discharge Instructions (Signed)
Cervical Cerclage, Care After ° °This sheet gives you information about how to care for yourself after your procedure. Your health care provider may also give you more specific instructions. If you have problems or questions, contact your health care provider. °What can I expect after the procedure? °After your procedure, it is common to have: °· Cramping in your abdomen. °· Mucus discharge for several days. °· Painful urination (dysuria). °· Small drops of blood coming from your vagina (spotting). °Follow these instructions at home: °· Follow instructions from your health care provider about bed rest, if this applies. You may need to be on bed rest for up to 3 days. °· Take over-the-counter and prescription medicines only as told by your health care provider. °· Do not drive or use heavy machinery while taking prescription pain medicine. °· Keep track of your vaginal discharge and watch for any changes. If you notice changes, tell your health care provider. °· Avoid physical activities and exercise until your health care provider approves. Ask your health care provider what activities are safe for you. °· Until your health care provider approves: °? Do not douche. °? Do not have sexual intercourse. °· Keep all pre-birth (prenatal) visits and all follow-up visits as told by your health care provider. This is important. You will probably have weekly visits to have your cervix checked, and you may need an ultrasound. °Contact a health care provider if: °· You have abnormal or bad-smelling vaginal discharge, such as clots. °· You develop a rash on your skin. This may look like redness and swelling. °· You become light-headed or feel like you are going to faint. °· You have abdominal pain that does not get better with medicine. °· You have persistent nausea or vomiting. °Get help right away if: °· You have vaginal bleeding that is heavier or more frequent than spotting. °· You are leaking fluid or have a gush of fluid  from your vagina (your water breaks). °· You have a fever or chills. °· You faint. °· You have uterine contractions. These may feel like: °? A back ache. °? Lower abdominal pain. °? Mild cramps, similar to menstrual cramps. °? Tightening or pressure in your abdomen. °· You think that your baby is not moving as much as usual, or you cannot feel your baby move. °· You have chest pain. °· You have shortness of breath. °This information is not intended to replace advice given to you by your health care provider. Make sure you discuss any questions you have with your health care provider. °Document Released: 05/13/2013 Document Revised: 07/05/2017 Document Reviewed: 02/24/2016 °Elsevier Patient Education © 2020 [employer] ° °

## 2019-05-18 ENCOUNTER — Inpatient Hospital Stay (HOSPITAL_COMMUNITY)
Admission: AD | Admit: 2019-05-18 | Discharge: 2019-05-18 | Disposition: A | Discharge status: Home / Self Care | Payer: 59 | Attending: Obstetrics and Gynecology | Admitting: Obstetrics and Gynecology

## 2019-05-18 ENCOUNTER — Other Ambulatory Visit: Payer: Self-pay

## 2019-05-18 ENCOUNTER — Inpatient Hospital Stay (HOSPITAL_BASED_OUTPATIENT_CLINIC_OR_DEPARTMENT_OTHER): Payer: 59

## 2019-05-18 ENCOUNTER — Encounter (HOSPITAL_COMMUNITY): Payer: Self-pay

## 2019-05-18 DIAGNOSIS — O42012 Preterm premature rupture of membranes, onset of labor within 24 hours of rupture, second trimester: Secondary | ICD-10-CM | POA: Diagnosis not present

## 2019-05-18 DIAGNOSIS — O4102X0 Oligohydramnios, second trimester, not applicable or unspecified: Secondary | ICD-10-CM | POA: Diagnosis not present

## 2019-05-18 DIAGNOSIS — O4692 Antepartum hemorrhage, unspecified, second trimester: Secondary | ICD-10-CM | POA: Diagnosis present

## 2019-05-18 DIAGNOSIS — E669 Obesity, unspecified: Secondary | ICD-10-CM | POA: Diagnosis not present

## 2019-05-18 DIAGNOSIS — Z3A20 20 weeks gestation of pregnancy: Secondary | ICD-10-CM | POA: Insufficient documentation

## 2019-05-18 DIAGNOSIS — O99212 Obesity complicating pregnancy, second trimester: Secondary | ICD-10-CM | POA: Diagnosis not present

## 2019-05-18 DIAGNOSIS — Z3A18 18 weeks gestation of pregnancy: Secondary | ICD-10-CM | POA: Diagnosis not present

## 2019-05-18 DIAGNOSIS — O321XX0 Maternal care for breech presentation, not applicable or unspecified: Secondary | ICD-10-CM | POA: Insufficient documentation

## 2019-05-18 DIAGNOSIS — O34219 Maternal care for unspecified type scar from previous cesarean delivery: Secondary | ICD-10-CM

## 2019-05-18 DIAGNOSIS — O42119 Preterm premature rupture of membranes, onset of labor more than 24 hours following rupture, unspecified trimester: Secondary | ICD-10-CM

## 2019-05-18 DIAGNOSIS — O3432 Maternal care for cervical incompetence, second trimester: Secondary | ICD-10-CM

## 2019-05-18 DIAGNOSIS — Z8759 Personal history of other complications of pregnancy, childbirth and the puerperium: Secondary | ICD-10-CM | POA: Insufficient documentation

## 2019-05-18 DIAGNOSIS — O09522 Supervision of elderly multigravida, second trimester: Secondary | ICD-10-CM

## 2019-05-18 DIAGNOSIS — O09212 Supervision of pregnancy with history of pre-term labor, second trimester: Secondary | ICD-10-CM

## 2019-05-18 DIAGNOSIS — Z8619 Personal history of other infectious and parasitic diseases: Secondary | ICD-10-CM | POA: Insufficient documentation

## 2019-05-18 HISTORY — DX: Anemia, unspecified: D64.9

## 2019-05-18 LAB — URINALYSIS, ROUTINE W REFLEX MICROSCOPIC
Bacteria, UA: NONE SEEN
Bilirubin Urine: NEGATIVE
Glucose, UA: NEGATIVE mg/dL
Ketones, ur: NEGATIVE mg/dL
Leukocytes,Ua: NEGATIVE
Nitrite: NEGATIVE
Protein, ur: NEGATIVE mg/dL
Specific Gravity, Urine: 1.006 (ref 1.005–1.030)
pH: 6 (ref 5.0–8.0)

## 2019-05-18 LAB — CBC WITH DIFFERENTIAL/PLATELET
Abs Immature Granulocytes: 0.56 10*3/uL — ABNORMAL HIGH (ref 0.00–0.07)
Basophils Absolute: 0 10*3/uL (ref 0.0–0.1)
Basophils Relative: 0 %
Eosinophils Absolute: 0.1 10*3/uL (ref 0.0–0.5)
Eosinophils Relative: 1 %
HCT: 38.4 % (ref 36.0–46.0)
Hemoglobin: 12.3 g/dL (ref 12.0–15.0)
Immature Granulocytes: 5 %
Lymphocytes Relative: 12 %
Lymphs Abs: 1.5 10*3/uL (ref 0.7–4.0)
MCH: 23.7 pg — ABNORMAL LOW (ref 26.0–34.0)
MCHC: 32 g/dL (ref 30.0–36.0)
MCV: 73.8 fL — ABNORMAL LOW (ref 80.0–100.0)
Monocytes Absolute: 1 10*3/uL (ref 0.1–1.0)
Monocytes Relative: 8 %
Neutro Abs: 9.1 10*3/uL — ABNORMAL HIGH (ref 1.7–7.7)
Neutrophils Relative %: 74 %
Platelets: 251 10*3/uL (ref 150–400)
RBC: 5.2 MIL/uL — ABNORMAL HIGH (ref 3.87–5.11)
RDW: 14 % (ref 11.5–15.5)
WBC: 12.3 10*3/uL — ABNORMAL HIGH (ref 4.0–10.5)
nRBC: 0 % (ref 0.0–0.2)

## 2019-05-18 MED ORDER — LACTATED RINGERS IV SOLN
INTRAVENOUS | Status: DC
  Administered 2019-05-18: 14:00:00 via INTRAVENOUS

## 2019-05-18 MED ORDER — TERBUTALINE SULFATE 1 MG/ML IJ SOLN
0.2500 mg | Freq: Once | INTRAMUSCULAR | Status: AC
  Administered 2019-05-18: 0.25 mg via SUBCUTANEOUS
  Filled 2019-05-18: qty 1

## 2019-05-18 MED ORDER — LACTATED RINGERS IV BOLUS
1000.0000 mL | Freq: Once | INTRAVENOUS | Status: AC
  Administered 2019-05-18: 1000 mL via INTRAVENOUS

## 2019-05-18 MED ORDER — INDOMETHACIN 50 MG RE SUPP
50.0000 mg | Freq: Once | RECTAL | Status: AC
  Administered 2019-05-18: 50 mg via RECTAL
  Filled 2019-05-18: qty 1

## 2019-05-18 NOTE — H&P (Signed)
Chief complaint: Vaginal bleeding  History of present illness: [name] at 20 weeks and 0 days with known cervical incompetence here with new onset vaginal bleeding.  Patient with 3 prior term vaginal deliveries then a 33-week twin cesarean section.  Patient had an ectopic pregnancy treated with methotrexate after a tubal reversal.  She is now pregnant again and dated by a 5 and 7-week ultrasound.  Patient did have a LEEP prior to her other pregnancies.  Patient presented at 18 weeks with mucousy vaginal discharge and was found to have a cervix 4 cm dilated with membranes in the vagina.  Patient was admitted for IV antibiotics and had a rescue cerclage placed within 24 hours of admission.  Patient has been doing well for the last 12 days until this morning when she woke up with bright red vaginal spotting.  Patient denies contractions and notes active fetal movement.  Patient reports no rupture of membranes.  Patient reports no fever but does note some left-sided intermittent crampy pain.  Of note when patient presented 12 days ago for her rescue cerclage she was noted to be Covid positive.  She has remained asymptomatic.  Past Medical History:  Diagnosis Date  . Anemia    not currently anemic  . Medical history non-contributory     Past Surgical History:  Procedure Laterality Date  . abdominalplasty    . CERVICAL CERCLAGE N/A 05/06/2019   Procedure: CERCLAGE CERVICAL;  Surgeon: Maxie Better, MD;  Location: [facility] LD ORS;  Service: Gynecology;  Laterality: N/A;  . CESAREAN SECTION    . TUBAL LIGATION    . tubalr eversal      Medications: Vaginal Prometrium  No known drug allergies  Physical exam: Vitals:   05/18/19 0810 05/18/19 0814 05/18/19 0840  BP:  140/90 126/90  Pulse:  (!) 104 (!) 104  Resp:  18   Temp:  98.9 F (37.2 C)   TempSrc:  Oral   SpO2:  100%   Weight: 105.6 kg    Height: 5\' 10"  (1.778 m)     General: Slightly anxious and tearful, no  distress Skin: Warm and dry Abdomen: Gravid, no right upper quadrant pain, no fundal tenderness Lower extremities: Nontender, no edema GU: Moderate amount of blood in the vagina, cleared with about 10 soaked fox swabs.  Cervical cerclage with knot at 12:00.  Suture seen circum for eventually around cervix without evidence of suture pull-through.  No bleeding from suture sites.  Small amount of red blood from close cervical os.  On sterile vaginal exam no membranes palpated, cervix closed at the suture with no excessive tension on the suture.  NST: Not done Toco: Contractions q. 5 minutes noted though patient does not feel them.  Toco does not pick up in certain positions.  Assessment plan: [name] at 20 weeks and 0 days with known cervical incompetence treated by cervical cerclage 12 days ago and vaginal Prometrium.  Given that patient presented with a cervix of 4 cm and membranes in the vagina she is at high risk for chorioamnionitis.  Nontender on exam but this would be the leading diagnosis.  Will check CBC with differential now.  Bolused with 1 L of IV fluids.  Plan transvaginal ultrasound to assess for membranes prolapsing through stitch, internal os dilation, and to assess placenta for possible abruption and AFI for possible slow amniotic leak.  Differential diagnosis discussed with patient.  Would defer tocolytics at this time.  30 05/18/2019  10:06 AM

## 2019-05-18 NOTE — MAU Note (Signed)
Dr Donalee Citrin spoke to patient on Vocera.

## 2019-05-18 NOTE — Progress Notes (Signed)
Case discussed with Dr. Garwin Brothers who plans to manage primarily.  She is requesting terbutaline and Indocin now.  She is aware ultrasound and CBC are still pending.  Ala Dach 05/18/2019 10:17 AM

## 2019-05-18 NOTE — Progress Notes (Addendum)
Patient notes only small amount of bloody discharge.  No contractions, no abdominal pain  CBC    Component Value Date/Time   WBC 12.3 (H) 05/18/2019 1023   RBC 5.20 (H) 05/18/2019 1023   HGB 12.3 05/18/2019 1023   HCT 38.4 05/18/2019 1023   PLT 251 05/18/2019 1023   MCV 73.8 (L) 05/18/2019 1023   MCH 23.7 (L) 05/18/2019 1023   MCHC 32.0 05/18/2019 1023   RDW 14.0 05/18/2019 1023   LYMPHSABS 1.5 05/18/2019 1023   MONOABS 1.0 05/18/2019 1023   EOSABS 0.1 05/18/2019 1023   BASOSABS 0.0 05/18/2019 1023    Ultrasound by MFM: Anhydramnios  Assessment and plan: Previable preterm premature rupture of membranes in the setting of advanced cervical dilation at 18 weeks and rescue cerclage.  Given that cervix was 4 cm dilated and membranes were in the vagina prior to the cerclage patient has always been at high risk of chorioamnionitis and preterm previable rupture of membranes.  No evidence of active infection at this time given only mild elevation in the white count and no fever or abdominal pain.  I discussed with patient she is at high risk for chorioamnionitis and she remains remote from viability.  Given risk of further cervical damage with contractions against the cerclage and risk of infection and hemorrhage with continuing the pregnancy I recommended to cut the cerclage and proceed with induction of labor.  I also discussed with the patient that anhydramnios at 20 weeks is associated with high fetal morbidity mortality even if she makes it to viability to due to the lack of fetal lung development.  Given that she has a prior cesarean section would use Pitocin for induction agent.  No indication at this time for repeat cesarean section.  Patient has had 3 prior vaginal deliveries.  Patient remains undecided on her plan of care at this time.  Dr. Cherly Hensen who is her primary OB provider has asked the patient be managed by the on-call physician as a [facility] patient and I will sign this patient  out to Dr. Billy Coast who is the oncoming on-call physician.  Lendon Colonel 05/18/2019 1:46 PM    Please note after the above note additional counseling was done with the patient and the husband (around 2pm, though this addendum is timed much later).   After patient and husband had additional time to process the finding of anhydramnios they were asking about ways to replace lost amniotic fluid. I did d/w them that sometime the amniotic sac can reseal and fluid can accumulate. They asked about alternative options other than cutting the cerclage and proceeding with delivery. I d/w them the option of continued expectant management but that they are still very remote from viability and expectant management is risky for the the mother (again reviewed infection which can lead to hemorrhage, DIC and loss of uterus; continued contractions which if cerclage left in place can cause further cervical damage and worsen outcomes due to cervical incompetence in future pregnancies). We discussed latency antibiotics to decrease chance of or slow onset to chorioamnionitis but that these antibiotics generally are given when prolongation of pregnancy for 1 wk would change clinical outcome- in her case, staying pregnant for 1 more week does not improve survival outcome for the baby. They asked how we can be sure infection is the cause of PPROM. I d/w pt that this is the most likely cause given her initial presentation at 18 wks but the infection is likley subclinical at  this point. Fevers, maternal tachycardia,  stronger contractions, abdominal pain, foul smelling fluid PV and loss of fetal movement are symptoms of chorio and should be evaluated. Pt asked for a second opinion and MFM was consulted- see consult note. At the end of that visit pt opted for expectant management. Will arrange outpt f/u.  Approximately 2 hours were spent evaluating this patient, reviewed options with pt, reviewing labs, u/s, history and discussing  care with her other providers.  Ala Dach 05/18/2019 10:07 PM

## 2019-05-18 NOTE — Progress Notes (Signed)
U/s paged

## 2019-05-18 NOTE — Consult Note (Signed)
MFM Consultation (Telephone)  Name: Rebecca Webster MRN: [id number] Requesting Provider: Aloha Gell, MD  Consultation through voicera (nurse Olivia Mackie).  Ms. Acri, [place] P5 at 20-weeks' gestation, is being evaluated at the [facility]U for complaints of vaginal bleeding.  She does not report uterine contractions and informed that the bleeding has almost stopped.  On 05/06/2019, she had rescue cerclage following the finding of 4 cm cervical dilation. Obstetric history significant for 3 term vaginal deliveries and a preterm twin cesarean delivery.  She had tubal reversal surgery in 2017 and had 1 ectopic pregnancy last year. Past medical history: She reports no chronic medical conditions. Allergies: No known drug allergies. Social history: Denies tobacco drug or alcohol use. Family history: No history of venous thromboembolism.  On today's ultrasound anhydramnios was seen in the cervix measured 4.1 cm on transvaginal ultrasound.  Cerclage is in place (see ultrasound report).   Very early preterm premature rupture of membranes (PPROM) -I explained the diagnosis with ultrasound images (real-time scanning) -It is possible that the patient has not been aware of PPROM because of vaginal bleeding. -Lung development takes place between 16 and 24 weeks' gestation. Pulmonary hypoplasia can occur if PPROM occurs early in gestation. Pulmonary hypoplasia cannot be predicted on ultrasound. If the newborn has severe pulmonary hypoplasia, there is increased postnatal mortality. -Other complications include maternal/fetal infections, placental abruption, cord prolapse, fetal demise and limb contractures (reversible). -Expectant management as an inpatient from 23 to 24 weeks. Discussed the benefit of antenatal corticosteroids and prophylactic antibiotics (from 23 to 24 weeks). -Outpatient management before viability is recommended. I advised the patient to abstain from sexual intercourse, vaginal examination and  insertion of tampons. -If expectant management is successful, we recommend delivery at 34 weeks as risk from intraamniotic infection outweighs the neonatal benefits at that gestational age. -Considerable number of women go into spontaneous labor (miscarriage) within a week. -Neonatal outcomes are gestational-age dependent. Neurological complications including cerebral palsy are increased if infants are born very preterm. -If expectant management is successful, we would recommend delivery at 34 weeks. -I also informed that termination of pregnancy is an option.   PPROM and Cerclage:  I informed the patient that no clear recommendations exist on the management of patients with PPROM with cerclage in place. One nonrandomized trial showed increase in the latency period (prolongation of pregnancy). It is also possible that risk of sepsis is increased with cerclage in place. ACOG does not have a clear recommendation on the management and both courses (removal or retention) have been adopted by different physicians.  My recommendation would be NOT to remove the cerclage now till 60 weeks' gestation. I also informed the patient that she can also opt to have the cerclage removed now if she perceives the risk of infection is greater. Patient opted not to have the cerclage removed. She understands that cerclage will be removed if she goes into active labor or has signs of chorioamnionitis.  Patient does not want the cerclage to be removed now. She would like to go home and discuss with her husband regarding continuation of pregnancy. She was informed to come to L&D if she has increased vaginal bleeding or uterine contractions or fever (infection).  Thank you for your consult. Please do not hesitate to contact me if you have any questions or concerns.  Consultation including face-to-face counseling: 30 min.

## 2019-05-18 NOTE — MAU Note (Signed)
Pt sates she had cerclage placed 9/30.  She began spotting this am approx 0500, noted as "clearish pink" & now has become more red.  Denies cramping.  Called on call provider & was told to come to MAU.

## 2019-05-19 ENCOUNTER — Other Ambulatory Visit: Payer: Self-pay | Admitting: Obstetrics and Gynecology

## 2019-05-22 ENCOUNTER — Other Ambulatory Visit: Payer: Self-pay

## 2019-05-22 ENCOUNTER — Inpatient Hospital Stay (HOSPITAL_COMMUNITY)
Admission: AD | Admit: 2019-05-22 | Discharge: 2019-05-23 | DRG: 779 | Disposition: A | Discharge status: Home / Self Care | Payer: 59 | Attending: Obstetrics and Gynecology | Admitting: Obstetrics and Gynecology

## 2019-05-22 ENCOUNTER — Encounter (HOSPITAL_COMMUNITY): Payer: Self-pay

## 2019-05-22 DIAGNOSIS — O021 Missed abortion: Principal | ICD-10-CM | POA: Diagnosis present

## 2019-05-22 DIAGNOSIS — O3432 Maternal care for cervical incompetence, second trimester: Secondary | ICD-10-CM | POA: Diagnosis present

## 2019-05-22 DIAGNOSIS — O4692 Antepartum hemorrhage, unspecified, second trimester: Secondary | ICD-10-CM | POA: Diagnosis present

## 2019-05-22 DIAGNOSIS — O209 Hemorrhage in early pregnancy, unspecified: Secondary | ICD-10-CM | POA: Diagnosis present

## 2019-05-22 DIAGNOSIS — Z3A20 20 weeks gestation of pregnancy: Secondary | ICD-10-CM | POA: Diagnosis not present

## 2019-05-22 DIAGNOSIS — O42912 Preterm premature rupture of membranes, unspecified as to length of time between rupture and onset of labor, second trimester: Secondary | ICD-10-CM | POA: Diagnosis present

## 2019-05-22 DIAGNOSIS — O328XX0 Maternal care for other malpresentation of fetus, not applicable or unspecified: Secondary | ICD-10-CM | POA: Diagnosis present

## 2019-05-22 DIAGNOSIS — O42112 Preterm premature rupture of membranes, onset of labor more than 24 hours following rupture, second trimester: Secondary | ICD-10-CM

## 2019-05-22 LAB — CBC
HCT: 34.7 % — ABNORMAL LOW (ref 36.0–46.0)
Hemoglobin: 11.4 g/dL — ABNORMAL LOW (ref 12.0–15.0)
MCH: 23.9 pg — ABNORMAL LOW (ref 26.0–34.0)
MCHC: 32.9 g/dL (ref 30.0–36.0)
MCV: 72.9 fL — ABNORMAL LOW (ref 80.0–100.0)
Platelets: 252 10*3/uL (ref 150–400)
RBC: 4.76 MIL/uL (ref 3.87–5.11)
RDW: 13.9 % (ref 11.5–15.5)
WBC: 14.4 10*3/uL — ABNORMAL HIGH (ref 4.0–10.5)
nRBC: 0 % (ref 0.0–0.2)

## 2019-05-22 LAB — TYPE AND SCREEN
ABO/RH(D): O POS
Antibody Screen: NEGATIVE

## 2019-05-22 MED ORDER — MENTHOL 3 MG MT LOZG: 1.0000 | LOZENGE | OROMUCOSAL | Status: DC | PRN

## 2019-05-22 MED ORDER — ONDANSETRON HCL 4 MG/2ML IJ SOLN
4.0000 mg | Freq: Four times a day (QID) | INTRAMUSCULAR | Status: DC | PRN
  Administered 2019-05-23: 4 mg via INTRAVENOUS
  Filled 2019-05-22: qty 2

## 2019-05-22 MED ORDER — DEXTROSE IN LACTATED RINGERS 5 % IV SOLN
INTRAVENOUS | Status: DC
  Administered 2019-05-22: 20:00:00 via INTRAVENOUS

## 2019-05-22 MED ORDER — KETOROLAC TROMETHAMINE 30 MG/ML IJ SOLN: 30.0000 mg | Freq: Four times a day (QID) | INTRAMUSCULAR | Status: DC | PRN

## 2019-05-22 MED ORDER — SODIUM CHLORIDE 0.9% FLUSH: 9.0000 mL | INTRAVENOUS | Status: DC | PRN

## 2019-05-22 MED ORDER — DIPHENHYDRAMINE HCL 50 MG/ML IJ SOLN: 12.5000 mg | Freq: Four times a day (QID) | INTRAMUSCULAR | Status: DC | PRN

## 2019-05-22 MED ORDER — SIMETHICONE 80 MG PO CHEW: 80.0000 mg | CHEWABLE_TABLET | Freq: Four times a day (QID) | ORAL | Status: DC | PRN

## 2019-05-22 MED ORDER — NALOXONE HCL 0.4 MG/ML IJ SOLN: 0.4000 mg | INTRAMUSCULAR | Status: DC | PRN

## 2019-05-22 MED ORDER — MISOPROSTOL 200 MCG PO TABS
600.0000 ug | ORAL_TABLET | Freq: Four times a day (QID) | ORAL | Status: DC
  Filled 2019-05-22: qty 3

## 2019-05-22 MED ORDER — ONDANSETRON HCL 4 MG PO TABS: 4.0000 mg | ORAL_TABLET | Freq: Four times a day (QID) | ORAL | Status: DC | PRN

## 2019-05-22 MED ORDER — DIPHENHYDRAMINE HCL 12.5 MG/5ML PO ELIX: 12.5000 mg | ORAL_SOLUTION | Freq: Four times a day (QID) | ORAL | Status: DC | PRN

## 2019-05-22 MED ORDER — ONDANSETRON HCL 4 MG/2ML IJ SOLN: 4.0000 mg | Freq: Four times a day (QID) | INTRAMUSCULAR | Status: DC | PRN

## 2019-05-22 MED ORDER — OXYCODONE-ACETAMINOPHEN 5-325 MG PO TABS: 1.0000 | ORAL_TABLET | ORAL | Status: DC | PRN

## 2019-05-22 MED ORDER — PRENATAL MULTIVITAMIN CH: 1.0000 | ORAL_TABLET | Freq: Every day | ORAL | Status: DC

## 2019-05-22 MED ORDER — MISOPROSTOL 200 MCG PO TABS: 600.0000 ug | ORAL_TABLET | Freq: Four times a day (QID) | ORAL | Status: DC

## 2019-05-22 MED ORDER — HYDROMORPHONE 1 MG/ML IV SOLN
INTRAVENOUS | Status: DC
  Administered 2019-05-23: 3.6 mg via INTRAVENOUS

## 2019-05-22 MED ORDER — MISOPROSTOL 200 MCG PO TABS
600.0000 ug | ORAL_TABLET | ORAL | Status: DC
  Administered 2019-05-22: 21:00:00 600 ug via ORAL
  Filled 2019-05-22: qty 3

## 2019-05-22 MED ORDER — GUAIFENESIN 100 MG/5ML PO SOLN: 15.0000 mL | ORAL | Status: DC | PRN

## 2019-05-22 MED ORDER — IBUPROFEN 800 MG PO TABS: 800.0000 mg | ORAL_TABLET | Freq: Three times a day (TID) | ORAL | Status: DC | PRN

## 2019-05-22 MED ORDER — HYDROMORPHONE HCL 1 MG/ML IJ SOLN: 0.2000 mg | INTRAMUSCULAR | Status: DC | PRN

## 2019-05-22 MED ORDER — HYDROMORPHONE 1 MG/ML IV SOLN
INTRAVENOUS | Status: DC
  Administered 2019-05-22: 0 mg via INTRAVENOUS
  Administered 2019-05-22: 30 mg via INTRAVENOUS
  Filled 2019-05-22: qty 30

## 2019-05-22 MED ORDER — SODIUM CHLORIDE 0.9% FLUSH: 3.0000 mL | INTRAVENOUS | Status: DC | PRN

## 2019-05-22 MED ORDER — ONDANSETRON HCL 4 MG/2ML IJ SOLN: 4.0000 mg | Freq: Three times a day (TID) | INTRAMUSCULAR | Status: DC | PRN

## 2019-05-22 MED ORDER — NALOXONE HCL 4 MG/10ML IJ SOLN
1.0000 ug/kg/h | INTRAVENOUS | Status: DC | PRN
  Filled 2019-05-22: qty 5

## 2019-05-22 NOTE — Progress Notes (Signed)
Cerclage removed under sterile technique by Dr. Garwin Brothers.  Pt not under any distress at this time.

## 2019-05-22 NOTE — H&P (Signed)
Rebecca Webster is an 38 y.o. female MBF @ 2 5/[redacted] week gestation admitted due to PTL and vaginal bleeding after PPROM with previous rescue cerclage placement 9/30. Previous Covid (+) 9/30.   Pertinent Gynecological History:  OB History: G6P5 ( 3 term , 1 ectopic, Preterm twins)   Menstrual History: Menarche age: n/a Patient's last menstrual period was 12/28/2018.    Past Medical History:  Diagnosis Date  . Anemia    not currently anemic  . Medical history non-contributory     Past Surgical History:  Procedure Laterality Date  . abdominalplasty    . CERVICAL CERCLAGE N/A 05/06/2019   Procedure: CERCLAGE CERVICAL;  Surgeon: Servando Salina, MD;  Location: [facility];  Service: Gynecology;  Laterality: N/A;  . CESAREAN SECTION    . TUBAL LIGATION    . tubalr eversal      History reviewed. No pertinent family history.  Social History:  reports that she has never smoked. She has never used smokeless tobacco. She reports that she does not drink alcohol or use drugs.  Allergies: No Known Allergies  Medications Prior to Admission  Medication Sig Dispense Refill Last Dose  . cyclobenzaprine (FLEXERIL) 10 MG tablet Take 1 tablet (10 mg total) by mouth 3 (three) times daily as needed for muscle spasms. 30 tablet 0 Past Week at Unknown time  . polyethylene glycol (MIRALAX / GLYCOLAX) 17 g packet Take 17 g by mouth daily. 14 each 11 05/21/2019 at Unknown time  . Prenatal Vit-Fe Fumarate-FA (PRENATAL MULTIVITAMIN) TABS tablet Take 1 tablet by mouth daily at 12 noon.   05/22/2019 at Unknown time    Review of Systems  All other systems reviewed and are negative.   Blood pressure 132/80, pulse (!) 102, temperature 99.2 F (37.3 C), temperature source Oral, resp. rate 17, weight 105.1 kg, last menstrual period 12/28/2018, SpO2 99 %, unknown if currently breastfeeding. Physical Exam  Constitutional: She is oriented to person, place, and time. She appears well-developed and  well-nourished.  HENT:  Head: Atraumatic.  Cardiovascular: Normal rate.  Respiratory: Effort normal.  GI: Soft. There is no abdominal tenderness.  Genitourinary:    Genitourinary Comments: Cervix 3 cm dilated with fetal foot through   Neurological: She is alert and oriented to person, place, and time.  Skin: Skin is warm and dry.  Gravid uterus  No results found for this or any previous visit (from the past 24 hour(s)).  No results found.  Assessment/Plan: PPROM with PTL Vaginal bleeding in pregnancy 2nd trim Cervical incompetence s/p cerclage removal IUP @ 20 6/7 weeks P) admit cytotec. PCA for pain mgmt. Pt declined chaplain. Cbc, T&S  Doshie Maggi A Merisa Julio 05/22/2019, 7:35 PM

## 2019-05-22 NOTE — MAU Provider Note (Signed)
Known PPROM with rescue cerclage presents with increased cramping and bleeding  pt was seen in the office earlier today where sono had shown foot in vagina however speculum and digital exam did not reveal this. Creamy blood tinged discharge was noted and sterile digital exam showed intact cerclage and closed cervix Bedside sonogram done to ascertain (+) FHR as doppler was not getting heart rate. FHR 155 noted   SSE: done showed foot protruding through cervix  Mobile and copious blood mucoid discharge.  At this time, advised parent cerclage would need to be removed. Father asked if feet could be placed back into cervix. Informed no.  Recommend cerclage removal which would result in delivery of the fetus who is nonviable. Pt advised her dating is correct as she was dated by sono [facility]   consent signed for cerclage removal  procedure: sterile speculum placed. Cerclage removed w/o difficulty. Cervix ~3 cm dilated with one foot present. Disc use of medication( cytotec ) to proceed with delivery. Per pt, she thought she was contracting enough that the process would go fast. Given the cervical exam and desire for pt  Not to remain in a probable infected environment, recommend use of cytotec. Pt agrees with plan. Disc possible retained placenta after delivery that may require removal via surgery

## 2019-05-22 NOTE — MAU Note (Signed)
FHT obtained via bedside u/s by Dr Garwin Brothers, 150's.

## 2019-05-22 NOTE — MAU Note (Signed)
Difficulty hearing FH with doppler,?122 briefly.  Pt states they used the [place] in the office during her appt, did not try with doppler. Pt felt kicking mid left side of abd while using doppler.

## 2019-05-22 NOTE — MAU Note (Signed)
Continues to be asymptomatic of covid symptoms. cerclage place 9/30.  Started feeling the pains last night. Had appt with OB today, wasn't dilated or anything, once she got home - she started contracting.  Has been having pink mucous d/c, now is more red in color, and more liquidy.Marland Kitchen

## 2019-05-22 NOTE — MAU Note (Signed)
Doppler attempted, no FHT obtained

## 2019-05-23 DIAGNOSIS — O42919 Preterm premature rupture of membranes, unspecified as to length of time between rupture and onset of labor, unspecified trimester: Secondary | ICD-10-CM

## 2019-05-23 MED ORDER — ONDANSETRON HCL 4 MG PO TABS
4.0000 mg | ORAL_TABLET | ORAL | Status: DC | PRN
  Filled 2019-05-23: qty 1

## 2019-05-23 MED ORDER — IBUPROFEN 600 MG PO TABS
600.0000 mg | ORAL_TABLET | Freq: Four times a day (QID) | ORAL | Status: DC
  Filled 2019-05-23: qty 1

## 2019-05-23 MED ORDER — OXYCODONE HCL 5 MG PO TABS: 10.0000 mg | ORAL_TABLET | ORAL | Status: DC | PRN

## 2019-05-23 MED ORDER — BENZOCAINE-MENTHOL 20-0.5 % EX AERO: 1.0000 "application " | INHALATION_SPRAY | CUTANEOUS | Status: DC | PRN

## 2019-05-23 MED ORDER — ZOLPIDEM TARTRATE 5 MG PO TABS: 5.0000 mg | ORAL_TABLET | Freq: Every evening | ORAL | Status: DC | PRN

## 2019-05-23 MED ORDER — OXYTOCIN 40 UNITS IN NORMAL SALINE INFUSION - SIMPLE MED
INTRAVENOUS | Status: AC
  Administered 2019-05-23: 666 m[IU]/min via INTRAVENOUS
  Filled 2019-05-23: qty 1000

## 2019-05-23 MED ORDER — COCONUT OIL OIL: 1.0000 "application " | TOPICAL_OIL | Status: DC | PRN

## 2019-05-23 MED ORDER — WITCH HAZEL-GLYCERIN EX PADS: 1.0000 "application " | MEDICATED_PAD | CUTANEOUS | Status: DC | PRN

## 2019-05-23 MED ORDER — DIPHENHYDRAMINE HCL 25 MG PO CAPS: 25.0000 mg | ORAL_CAPSULE | Freq: Four times a day (QID) | ORAL | Status: DC | PRN

## 2019-05-23 MED ORDER — OXYCODONE HCL 5 MG PO TABS: 5.0000 mg | ORAL_TABLET | ORAL | Status: DC | PRN

## 2019-05-23 MED ORDER — DIBUCAINE (PERIANAL) 1 % EX OINT: 1.0000 "application " | TOPICAL_OINTMENT | CUTANEOUS | Status: DC | PRN

## 2019-05-23 MED ORDER — PRENATAL MULTIVITAMIN CH: 1.0000 | ORAL_TABLET | Freq: Every day | ORAL | Status: DC

## 2019-05-23 MED ORDER — OXYTOCIN 40 UNITS IN NORMAL SALINE INFUSION - SIMPLE MED
1.0000 m[IU]/min | INTRAVENOUS | Status: DC
  Administered 2019-05-23: 04:00:00 666 m[IU]/min via INTRAVENOUS

## 2019-05-23 MED ORDER — ONDANSETRON HCL 4 MG/2ML IJ SOLN
4.0000 mg | INTRAMUSCULAR | Status: DC | PRN
  Administered 2019-05-23: 05:00:00 4 mg via INTRAVENOUS
  Filled 2019-05-23: qty 2

## 2019-05-23 MED ORDER — SENNOSIDES-DOCUSATE SODIUM 8.6-50 MG PO TABS: 2.0000 | ORAL_TABLET | ORAL | Status: DC

## 2019-05-23 MED ORDER — SIMETHICONE 80 MG PO CHEW: 80.0000 mg | CHEWABLE_TABLET | ORAL | Status: DC | PRN

## 2019-05-23 MED ORDER — FERROUS SULFATE 325 (65 FE) MG PO TABS
325.0000 mg | ORAL_TABLET | Freq: Two times a day (BID) | ORAL | Status: DC
  Administered 2019-05-23: 08:00:00 325 mg via ORAL
  Filled 2019-05-23: qty 1

## 2019-05-23 MED ORDER — ACETAMINOPHEN 325 MG PO TABS: 650.0000 mg | ORAL_TABLET | ORAL | Status: DC | PRN

## 2019-05-23 NOTE — Discharge Summary (Signed)
Physician Discharge Summary  Patient ID: Rebecca Webster MRN: [id number] DOB/AGE: 38-24-1982 38 y.o.  Admit date: 05/22/2019 Discharge date: 05/23/2019  Admission Diagnoses: , PTL, PPROM, cervical incompetence, IUP@ 20 5/7 wk, Footling breech Vaginal bleeding in pregnancy  Discharge Diagnoses: same Active Problems:   Vaginal bleeding in pregnancy, second trimester   Discharged Condition: stable  Hospital Course: known PPROM with plan for expectant management in pre-viable fetus with cerclage still in place.  pt presented to MAU with complaint of increased contractions, vaginal bleeding. She was found to have one foot in vagina through cervix with cerclage in place. (+) blood.  Bedside sonogram done to assess for FHR due to inability to obtain FHR with doppler. (+) FHR was noted. No fluid was seen around baby. Cerclage was removed in MAU. Cervix was 3 cm dilated. Pt was counselled and transferred to antepartum unit where cytotec( oral ) was given and she subsequently delivered nonviable female infant over intact perineum and placenta intact. Placenta was sent to path. Pt declined autopsy. She was observed for several hours and was ok to being discharged  Consults: None  Significant Diagnostic Studies: labs:  CBC Latest Ref Rng & Units 05/22/2019 05/18/2019 05/05/2019  WBC 4.0 - 10.5 K/uL 14.4(H) 12.3(H) 7.7  Hemoglobin 12.0 - 15.0 g/dL 11.4(L) 12.3 11.4(L)  Hematocrit 36.0 - 46.0 % 34.7(L) 38.4 35.1(L)  Platelets 150 - 400 K/uL 252 251 202    Treatments: cerclage removal  Discharge Exam: Blood pressure 127/77, pulse 88, temperature 98.7 F (37.1 C), temperature source Oral, resp. rate 17, weight 105.1 kg, last menstrual period 12/28/2018, SpO2 97 %, unknown if currently breastfeeding. General appearance: alert, cooperative and no distress Resp: normal percussion bilaterally GI: uterus firm nontender Pelvic: deferred  Disposition: Discharge disposition: 01-Home or Self  Care       Discharge Instructions    Activity as tolerated   Complete by: As directed    Call MD for:   Complete by: As directed    Soaking a regular maxi pad every hour or more frequently, passing large clots  For C-section:  Call if incision red, draining or increased pain. Passing clots, nausea, vomiting , severe abdominal pain   Call MD for:  temperature >100.4   Complete by: As directed    Diet general   Complete by: As directed    Discharge instructions   Complete by: As directed    For C-section: no heavy lifting(>25 lbs ) for two weeks, No driving for two weeks.     Allergies as of 05/23/2019   No Known Allergies     Medication List    TAKE these medications   prenatal multivitamin Tabs tablet Take 1 tablet by mouth daily at 12 noon.      Follow-up Information    Servando Salina, MD Follow up in 2 week(s).   Specialty: Obstetrics and Gynecology Contact information: [address] [place] [address] [phone]           Signed: Alanda Slim A Jamus Loving 05/23/2019, 11:41 AM

## 2019-05-23 NOTE — Progress Notes (Signed)
Called by RN regarding pt delivering S/p cytotec x 1 On arrival, nonviable fetus was being held by mom. Pt apparently was 4 cm dilated and went to the bathroom and quickly delivered. Placenta also delivered. On exam it was intact. To path. Baby grossly normal  Female with left foot swollen Declines autopsy. grieving appropriately

## 2019-05-23 NOTE — Discharge Instructions (Signed)
Fetal Demise °Fetal demise, also called intrauterine fetal demise (IUFD) or fetal death, is the loss of a baby after 20 weeks of pregnancy. When this occurs, the unborn baby (fetus) does not show any signs of life, such as a heartbeat or breathing. Most women recognize the loss of the baby soon after it happens, due to the lack of fetal movement. Some women experience spontaneous labor shortly after fetal demise resulting in a stillborn birth (stillbirth). °What are the causes? °The cause is often unknown. Sometimes fetal demise may be caused by: °· A problem with the umbilical cord or placenta. °· A birth defect or genetic disorder in the baby. °· A serious infection during pregnancy. °· The baby not growing enough in the womb (poor fetal growth). °· Long-term health problems in the mother, such as diabetes, lupus, kidney disease, thyroid disease, or gallbladder disease. °· High blood pressure (hypertension) or a related disorder such as preeclampsia. °What increases the risk? °You may have a higher risk of fetal demise if you: °· Use tobacco, drugs, or alcohol during pregnancy. °· Are pregnant with two or more babies. °· Are older than age 35. °· Are of African-American descent. °· Are pregnant for the first time. °· Are obese. °What are the signs or symptoms? °When you experience fetal demise, your abdomen will stop getting bigger and you will not feel your baby move. °Fetal demise increases your risk of certain complications, such as: °· Severe bleeding due to problems with blood clotting (disseminated intravascular coagulation). °· Infection in the uterus (intrauterine infection). °· Increased bleeding from the uterus. °How is this diagnosed? °A physical exam may show signs of fetal demise. The diagnosis must be confirmed with an ultrasound to check for: °· The lack of a heartbeat. °· The lack of fetal movement. °How is this treated? °Treatment for fetal demise may include: °· Waiting for your body's natural  process of labor to begin (expectant management). You will be monitored closely during this time for signs of infection or other problems. You may have the option to return home with instructions about what to expect as your body continues to naturally release your baby and placenta. If labor does not start on its own, you will need a labor induction. °· Using medicines to start your labor (labor induction). If labor has not started, you may choose to have your labor induced with medicines and be monitored in the hospital setting. This will cause you to have contractions in order to deliver your baby and placenta. °· Doing surgery to deliver your baby and placenta through an incision in your abdomen and your uterus (cesarean delivery, or C-section). °After delivery: °· Parents are encouraged to see and hold their baby. This may help you with the grieving process. °· You may consider creating keepsakes, such as taking a photo or getting an imprint of your baby's footprint or handprint. °· Request to have any religious or cultural ceremonies that you prefer. Work with your health care team to make arrangements for the handling of your baby's remains. °· Your baby may be examined to determine if problems were present that could happen again in a future pregnancy. °· You may be given antibiotic medicine to treat infection if you lost your baby because of an infection. °· If you have Rh-negative blood, you may be given a injection of Rho(D) immune globulin to help prevent problems with future pregnancies. °Follow these instructions at home: °Medicines °· Take over-the-counter and prescription medicines only as   told by your health care provider.  If you were prescribed an antibiotic, take it as told by your health care provider. Do not stop taking the antibiotic even if you start to feel better. General instructions  Your health care provider may recommend working with a trained grief counselor who can help you work  through your emotions. It may help to work with this counselor before and after delivery.  It may be helpful to meet with others who have experienced fetal demise. Ask your health care provider about support groups and resources.  Talk with your health care provider about any activity restrictions, including sexual activity.  Use a sanitary napkin for bleeding and discharge from your vagina. Do not douche or use tampons until your health care provider says that this is safe.  Keep all follow-up visits as told by your health care provider. This is important.  When you are ready, meet with your health care provider to discuss steps to take for a future pregnancy. Contact a health care provider if you:  Continue to experience grief, sadness, or lack of motivation for everyday activities, and those feelings do not improve over time.  Have painful, hard, or reddened breasts.  Have not had a menstrual period by the 12th week after delivery. Get help right away if you have:  Heavy vaginal bleeding that soaks more than 1 regular sanitary pad in an hour.  Chest pain.  A fever or chills.  Shortness of breath.  Vaginal bleeding that continues for more than 6 weeks after delivery.  Pain while urinating.  Blood in your urine.  Bad-smelling vaginal discharge.  Abdominal pain.  Leg pain, swelling, or redness.  A severe headache.  Changes in your vision.  Feelings of sadness that take over your thoughts, or you have thoughts of hurting yourself. If you ever feel like you may hurt yourself or others, or have thoughts about taking your own life, get help right away. You can go to your nearest emergency department or call:  Your local emergency services (911 in the [place]  A suicide crisis helpline, such as the [facility] at 807-650-4531. This is open 24 hours a day. Summary  Fetal demise, also called intrauterine fetal demise (IUFD) or fetal death, is the  loss of a baby after 20 weeks of pregnancy.  You may be at higher risk for fetal demise if you use tobacco, drugs, or alcohol during pregnancy.  Treatment may include waiting for your body's natural process of labor to begin, using medicine to start labor (labor induction), or doing surgery to deliver your baby (cesarean delivery, or C-section).  Your health care provider may recommend working with a trained grief counselor who can help you work through your emotions. This information is not intended to replace advice given to you by your health care provider. Make sure you discuss any questions you have with your health care provider. Document Released: 10/19/2008 Document Revised: 07/05/2017 Document Reviewed: 06/04/2017 Elsevier Patient Education  2020 [employer].

## 2019-05-23 NOTE — Progress Notes (Signed)
Discharge instructions given to pt. Discussed post vaginal delivery care, signs and symptoms to report to the MD, upcoming appointments, and meds. Pt verbalizes understanding and has no questions or concerns at this time. Pt discharged from hospital in stable condition.  Placenta sent to surgical pathology and baby was taken to the morgue by [facility].

## 2019-05-23 NOTE — Progress Notes (Signed)
Pt was sitting up in bed, awake and alert when I arrived. Her SO, Mr. Glennon Mac, was bedside. She shared with me her story leading up to her loss this morning. She said she had been diagnosed w/COVID during her pregnancy. She described herself as asymptomatic and talked about how she continued to work. As she shared her story she told how her doctor did everything she could. She said (baby) Cash stayed healthy until the end. She said she was "at peace" with what happened knowing she, and her doctor, had done everything she could. She said she has her moments and that she will have her moments. We talked about leaning into those moments. She was appropriately tearful at times, as was Mr. Glennon Mac. She said they have selected [facility] as their funeral service provider. She wanted to know if Leanord Asal will be okay here until they speak with them on Monday. I confirmed he will be taken care of here until they meet with [facility]. I provided empathic listening and emotional support. Please page if additional assistance is needed. [place], MDiv   05/23/19 1100  Clinical Encounter Type  Visited With Patient and family together

## 2019-06-01 LAB — RPR: RPR Titer: UNDETERMINED

## 2019-08-07 NOTE — L&D Delivery Note (Signed)
Delivery Note At 6:43 PM a viable female was delivered via VBAC, Spontaneous (Presentation:  loa    ).  APGAR per nursing notes, good cry, tone: , ; weight pending .   Placenta status:  Spont, intact,  .  Cord:  Around leg, 3 vc with the following complications:none  .  Cord pH: n/a  Anesthesia: Spinal Episiotomy: None Lacerations:  Small first degree, hemostatic Suture Repair: 3.0 vicryl rapide single figure of 8 for reapproximation. single figure of 8 to bleeding pinpoint lacertation in vagina at 7 oclock Est. Blood Loss (mL):  250  Mom to postpartum.  Baby to Couplet care / Skin to Skin.  Lendon Colonel 03/02/2020, 6:55 PM

## 2019-09-10 ENCOUNTER — Other Ambulatory Visit: Payer: Self-pay | Admitting: Obstetrics and Gynecology

## 2019-09-11 ENCOUNTER — Other Ambulatory Visit: Payer: Self-pay

## 2019-09-18 ENCOUNTER — Other Ambulatory Visit (HOSPITAL_COMMUNITY): Payer: Self-pay | Admitting: Obstetrics and Gynecology

## 2019-09-18 DIAGNOSIS — Z3A18 18 weeks gestation of pregnancy: Secondary | ICD-10-CM

## 2019-09-18 DIAGNOSIS — Z363 Encounter for antenatal screening for malformations: Secondary | ICD-10-CM

## 2019-09-18 DIAGNOSIS — O280 Abnormal hematological finding on antenatal screening of mother: Secondary | ICD-10-CM

## 2019-09-18 DIAGNOSIS — O09522 Supervision of elderly multigravida, second trimester: Secondary | ICD-10-CM

## 2019-09-24 ENCOUNTER — Encounter (HOSPITAL_COMMUNITY): Payer: Self-pay | Admitting: *Deleted

## 2019-09-24 ENCOUNTER — Telehealth (HOSPITAL_COMMUNITY): Payer: Self-pay | Admitting: *Deleted

## 2019-09-24 NOTE — Telephone Encounter (Signed)
Preadmission screen  

## 2019-09-28 ENCOUNTER — Other Ambulatory Visit (HOSPITAL_COMMUNITY)
Admission: RE | Admit: 2019-09-28 | Discharge: 2019-09-28 | Disposition: A | Discharge status: Home / Self Care | Payer: 59 | Source: Ambulatory Visit | Attending: Obstetrics and Gynecology | Admitting: Obstetrics and Gynecology

## 2019-09-28 DIAGNOSIS — Z20822 Contact with and (suspected) exposure to covid-19: Secondary | ICD-10-CM | POA: Insufficient documentation

## 2019-09-28 DIAGNOSIS — Z01812 Encounter for preprocedural laboratory examination: Secondary | ICD-10-CM | POA: Insufficient documentation

## 2019-09-28 LAB — SARS CORONAVIRUS 2 (TAT 6-24 HRS): SARS Coronavirus 2: NEGATIVE

## 2019-09-29 ENCOUNTER — Other Ambulatory Visit: Payer: Self-pay

## 2019-09-29 ENCOUNTER — Ambulatory Visit (HOSPITAL_COMMUNITY): Payer: 59 | Admitting: Anesthesiology

## 2019-09-29 ENCOUNTER — Encounter (HOSPITAL_COMMUNITY)
Admission: RE | Disposition: A | Discharge status: Home / Self Care | Payer: Self-pay | Source: Home / Self Care | Attending: Obstetrics and Gynecology

## 2019-09-29 ENCOUNTER — Encounter (HOSPITAL_COMMUNITY): Payer: Self-pay | Admitting: Obstetrics and Gynecology

## 2019-09-29 ENCOUNTER — Ambulatory Visit (HOSPITAL_COMMUNITY)
Admission: RE | Admit: 2019-09-29 | Discharge: 2019-09-29 | Disposition: A | Discharge status: Home / Self Care | Payer: 59 | Attending: Obstetrics and Gynecology | Admitting: Obstetrics and Gynecology

## 2019-09-29 DIAGNOSIS — Z9889 Other specified postprocedural states: Secondary | ICD-10-CM | POA: Insufficient documentation

## 2019-09-29 DIAGNOSIS — N883 Incompetence of cervix uteri: Secondary | ICD-10-CM | POA: Diagnosis not present

## 2019-09-29 DIAGNOSIS — Z3A13 13 weeks gestation of pregnancy: Secondary | ICD-10-CM | POA: Diagnosis not present

## 2019-09-29 HISTORY — PX: CERVICAL CERCLAGE: SHX1329

## 2019-09-29 LAB — CBC
HCT: 37.9 % (ref 36.0–46.0)
Hemoglobin: 12.1 g/dL (ref 12.0–15.0)
MCH: 23.3 pg — ABNORMAL LOW (ref 26.0–34.0)
MCHC: 31.9 g/dL (ref 30.0–36.0)
MCV: 73 fL — ABNORMAL LOW (ref 80.0–100.0)
Platelets: 206 10*3/uL (ref 150–400)
RBC: 5.19 MIL/uL — ABNORMAL HIGH (ref 3.87–5.11)
RDW: 14.1 % (ref 11.5–15.5)
WBC: 5.6 10*3/uL (ref 4.0–10.5)
nRBC: 0 % (ref 0.0–0.2)

## 2019-09-29 SURGERY — CERCLAGE, CERVIX, VAGINAL APPROACH
Anesthesia: Spinal

## 2019-09-29 MED ORDER — FENTANYL CITRATE (PF) 100 MCG/2ML IJ SOLN: 25.0000 ug | INTRAMUSCULAR | Status: DC | PRN

## 2019-09-29 MED ORDER — KETOROLAC TROMETHAMINE 30 MG/ML IJ SOLN: 30.0000 mg | Freq: Once | INTRAMUSCULAR | Status: DC | PRN

## 2019-09-29 MED ORDER — LACTATED RINGERS IV SOLN: INTRAVENOUS | Status: DC

## 2019-09-29 MED ORDER — ACETAMINOPHEN 10 MG/ML IV SOLN
INTRAVENOUS | Status: AC
  Filled 2019-09-29: qty 100

## 2019-09-29 MED ORDER — FENTANYL CITRATE (PF) 100 MCG/2ML IJ SOLN
INTRAMUSCULAR | Status: DC | PRN
  Administered 2019-09-29 (×2): 50 ug via INTRAVENOUS

## 2019-09-29 MED ORDER — FENTANYL CITRATE (PF) 100 MCG/2ML IJ SOLN
INTRAMUSCULAR | Status: AC
  Filled 2019-09-29: qty 2

## 2019-09-29 MED ORDER — SODIUM CHLORIDE 0.9 % IR SOLN
Status: DC | PRN
  Administered 2019-09-29: 1000 mL

## 2019-09-29 MED ORDER — BUPIVACAINE IN DEXTROSE 0.75-8.25 % IT SOLN
INTRATHECAL | Status: DC | PRN
  Administered 2019-09-29: 1 mL via INTRATHECAL

## 2019-09-29 MED ORDER — ACETAMINOPHEN 10 MG/ML IV SOLN
1000.0000 mg | Freq: Once | INTRAVENOUS | Status: DC | PRN
  Administered 2019-09-29: 1000 mg via INTRAVENOUS

## 2019-09-29 SURGICAL SUPPLY — 20 items
CANISTER SUCT 3000ML PPV (MISCELLANEOUS) ×3 IMPLANT
CATH FOLEY 2WAY SLVR 30CC 16FR (CATHETERS) IMPLANT
GLOVE BIOGEL PI IND STRL 7.0 (GLOVE) ×3 IMPLANT
GLOVE BIOGEL PI INDICATOR 7.0 (GLOVE) ×6
GLOVE ECLIPSE 6.5 STRL STRAW (GLOVE) ×3 IMPLANT
GOWN STRL REUS W/TWL LRG LVL3 (GOWN DISPOSABLE) ×6 IMPLANT
NS IRRIG 1000ML POUR BTL (IV SOLUTION) ×3 IMPLANT
PACK VAGINAL MINOR WOMEN LF (CUSTOM PROCEDURE TRAY) ×3 IMPLANT
PAD OB MATERNITY 4.3X12.25 (PERSONAL CARE ITEMS) ×3 IMPLANT
PAD PREP 24X48 CUFFED NSTRL (MISCELLANEOUS) ×3 IMPLANT
SUT ETHIBOND  5 (SUTURE) ×2
SUT ETHIBOND 5 (SUTURE) ×1 IMPLANT
SUT PROLENE 1 CTX 30  8455H (SUTURE) ×2
SUT PROLENE 1 CTX 30 8455H (SUTURE) ×1 IMPLANT
SYR BULB IRRIGATION 50ML (SYRINGE) ×3 IMPLANT
TOWEL OR 17X24 6PK STRL BLUE (TOWEL DISPOSABLE) ×6 IMPLANT
TRAY FOLEY W/BAG SLVR 14FR (SET/KITS/TRAYS/PACK) ×3 IMPLANT
TUBING NON-CON 1/4 X 20 CONN (TUBING) IMPLANT
TUBING NON-CON 1/4 X 20' CONN (TUBING)
YANKAUER SUCT BULB TIP NO VENT (SUCTIONS) IMPLANT

## 2019-09-29 NOTE — Anesthesia Procedure Notes (Signed)
Spinal  Patient location during procedure: OR Start time: 09/29/2019 3:05 PM End time: 09/29/2019 3:22 PM Staffing Performed: anesthesiologist  Anesthesiologist: Elmer Picker, MD Preanesthetic Checklist Completed: patient identified, IV checked, risks and benefits discussed, surgical consent, monitors and equipment checked, pre-op evaluation and timeout performed Spinal Block Patient position: sitting Prep: DuraPrep and site prepped and draped Patient monitoring: cardiac monitor, continuous pulse ox and blood pressure Approach: midline Location: L3-4 Injection technique: single-shot Needle Needle type: Pencan and Tuohy  Needle gauge: 24 G Needle length: 9 cm Assessment Sensory level: T6 Additional Notes Functioning IV was confirmed and monitors were applied. Sterile prep and drape, including hand hygiene and sterile gloves were used. The patient was positioned and the spine was prepped. The skin was anesthetized with lidocaine.  Free flow of clear CSF was obtained prior to injecting local anesthetic into the CSF.  The spinal needle aspirated freely following injection.  The needle was carefully withdrawn.  The patient tolerated the procedure well.

## 2019-09-29 NOTE — Discharge Instructions (Signed)
Cervical Cerclage, Care After This sheet gives you information about how to care for yourself after your procedure. Your health care provider may also give you more specific instructions. If you have problems or questions, contact your health care provider. What can I expect after the procedure? After your procedure, it is common to have:  Cramping in your abdomen.  Mucus discharge from your vagina. This may last for several days.  Painful urination (dysuria).  Spotting, or small drops of blood coming from your vagina. Follow these instructions at home:  Medicines  Take over-the-counter and prescription medicines only as told by your health care provider.  Ask your health care provider if the medicine prescribed to you requires you to avoid driving or using heavy machinery. General instructions  If you are told to go on bed rest, follow instructions from your health care provider. You may need to be on bed rest for up to 3 days.  Keep track of your vaginal discharge and watch for any changes. If you notice changes, tell your health care provider.  Avoid physical activities and exercise until your health care provider approves. Ask your health care provider what activities are safe for you.  Do not douche or have sex until your health care provider says it is okay to do so.  Keep all follow-up visits, including prenatal visits, as told by your health care provider. This is important. ? Prenatal visits are all the care that you receive before the birth of your baby. ? You may also need an ultrasound.  You may be asked to have weekly visits to have your cervix checked. Contact a health care provider if you:  Have abnormal discharge from your vagina, such as clots.  Have a bad-smelling discharge from your vagina.  Develop a rash on your skin. This may look like redness and swelling.  Become light-headed or feel like you are going to faint.  Have abdominal pain that does not  get better with medicine.  Have nausea or vomiting that does not go away. Get help right away if you:  Have vaginal bleeding that is heavier or more frequent than spotting.  Are leaking fluid or your water breaks.  Have a fever or chills.  Faint.  Have uterine contractions. These may feel like: ? A back ache. ? Lower abdominal pain. ? Mild cramps, similar to menstrual cramps. ? Tightening or pressure in your abdomen.  Think that your baby is not moving as much as usual, or you cannot feel your baby move.  Have chest pain.  Have shortness of breath. Summary  After the procedure, it is common to have cramping, vaginal discharge, painful urination, and small drops of blood coming from your vagina.  If you are told to go on bed rest, follow instructions from your health care provider. You may need to be on bed rest for up to 3 days.  Keep track of your vaginal discharge and watch for any changes. If you notice changes, tell your health care provider.  Contact a health care provider if you have abnormal vaginal discharge, become light-headed, or have pain that cannot be controlled with medicines.  Get help right away if you have heavy vaginal bleeding, your water breaks, or you have uterine contractions. Also, get help right away if your baby is not moving as much as usual, or you have chest pain or shortness of breath. This information is not intended to replace advice given to you by your health care provider.   Make sure you discuss any questions you have with your health care provider. Document Revised: 05/19/2019 Document Reviewed: 03/18/2019 Elsevier Patient Education  2020 [employer]  

## 2019-09-29 NOTE — H&P (Signed)
Rebecca Webster is a 39 y.o. female presenting@ 13 6/[redacted] wk gestation for cervical cerclage placement due to cervical incompetence OB History    Gravida  7   Para  4   Term  3   Preterm  1   AB  2   Living  5     SAB  1   TAB      Ectopic  1   Multiple  1   Live Births  5          Past Medical History:  Diagnosis Date  . Anemia    not currently anemic  . Medical history non-contributory    Past Surgical History:  Procedure Laterality Date  . abdominalplasty    . CERVICAL CERCLAGE N/A 05/06/2019   Procedure: CERCLAGE CERVICAL;  Surgeon: Maxie Better, MD;  Location: [facility];  Service: Gynecology;  Laterality: N/A;  . CESAREAN SECTION    . TUBAL LIGATION    . tubalr eversal     Family History: family history includes Heart disease in her maternal grandmother. Social History:  reports that she has never smoked. She has never used smokeless tobacco. She reports that she does not drink alcohol or use drugs.     Maternal Diabetes: No Genetic Screening: Abnormal:  Results: Other: repeat panorama pending.  Maternal Ultrasounds/Referrals: Other: Fetal Ultrasounds or other Referrals:  None Maternal Substance Abuse:  No Significant Maternal Medications:  None Significant Maternal Lab Results:  None Other Comments:  hx cervical incompetence  Review of Systems History   Blood pressure (!) 122/98, pulse 82, temperature 98.2 F (36.8 C), temperature source Oral, resp. rate 18, height 5\' 10"  (1.778 m), weight 111.1 kg, last menstrual period 12/28/2018, SpO2 100 %, currently breastfeeding. Exam Physical Exam  Constitutional: She is oriented to person, place, and time. She appears well-developed and well-nourished.  HENT:  Head: Atraumatic.  Eyes: EOM are normal.  Cardiovascular: Regular rhythm.  Respiratory: Breath sounds normal.  GI: Soft.  Musculoskeletal:     Cervical back: Neck supple.  Neurological: She is alert and oriented to person, place, and  time.  Skin: Skin is warm and dry.    Prenatal labs: ABO, Rh: --/--/O POS (10/16 1640) Antibody: NEG (10/16 1640) Rubella:  Immune RPR: NR HBsAg:   neg HIV:   neg GBS: not done ( 13 6/7 wk_  Assessment/Plan: Cervical incompetence IUP@ 13 6/7 wk P) cervical cerclage. Risk of surgery includes infection, bleeding, cervical scar tissue, injury to surrounding organ structures. All ? answered  Rebecca Webster 09/29/2019, 2:46 PM

## 2019-09-29 NOTE — Anesthesia Postprocedure Evaluation (Signed)
Anesthesia Post Note  Patient: Rebecca Webster  Procedure(s) Performed: CERCLAGE CERVICAL (N/A )     Patient location during evaluation: PACU Anesthesia Type: Spinal Level of consciousness: oriented and awake and alert Pain management: pain level controlled Vital Signs Assessment: post-procedure vital signs reviewed and stable Respiratory status: spontaneous breathing, respiratory function stable and patient connected to nasal cannula oxygen Cardiovascular status: blood pressure returned to baseline and stable Postop Assessment: no headache, no backache and no apparent nausea or vomiting Anesthetic complications: no    Last Vitals:  Vitals:   09/29/19 1743 09/29/19 1745  BP:  125/78  Pulse: 78   Resp: 19   Temp: 36.9 C   SpO2: 100%     Last Pain:  Vitals:   09/29/19 1753  TempSrc:   PainSc: 5    Pain Goal:                Epidural/Spinal Function Cutaneous sensation: Able to Wiggle Toes (09/29/19 1743), Patient able to flex knees: Yes (09/29/19 1743), Patient able to lift hips off bed: Yes (09/29/19 1743), Back pain beyond tenderness at insertion site: No (09/29/19 1743), Progressively worsening motor and/or sensory loss: No (09/29/19 1743), Bowel and/or bladder incontinence post epidural: No (09/29/19 1743)  Jasiah Buntin L Glenyce Randle

## 2019-09-29 NOTE — Brief Op Note (Signed)
09/29/2019  3:52 PM  PATIENT:  Rebecca Webster  39 y.o. female  PRE-OPERATIVE DIAGNOSIS:  Cervical Incompetence, IUP @ 13 6/7 weeks  POST-OPERATIVE DIAGNOSIS:  Cervical Incompetence, IUP @ 13 6/7 week   PROCEDURE:  McDonald cervical cerclage  SURGEON:  Surgeon(s) and Role:    * Maxie Better, MD - Primary  PHYSICIAN ASSISTANT:   ASSISTANTS: none   ANESTHESIA:   spinal  EBL:  10 mL   BLOOD ADMINISTERED:none  DRAINS: none   LOCAL MEDICATIONS USED:  NONE  SPECIMEN:  No Specimen  DISPOSITION OF SPECIMEN:  N/A  COUNTS:  YES  TOURNIQUET:  * No tourniquets in log *  DICTATION: .Other Dictation: Dictation Number [id number]  PLAN OF CARE: Discharge to home after PACU  PATIENT DISPOSITION:  PACU - hemodynamically stable.   Delay start of Pharmacological VTE agent (>24hrs) due to surgical blood loss or risk of bleeding: no

## 2019-09-29 NOTE — Transfer of Care (Signed)
Immediate Anesthesia Transfer of Care Note  Patient: Rebecca Webster  Procedure(s) Performed: CERCLAGE CERVICAL (N/A )  Patient Location: PACU  Anesthesia Type:Spinal  Level of Consciousness: awake, alert , oriented and patient cooperative  Airway & Oxygen Therapy: Patient Spontanous Breathing  Post-op Assessment: Report given to RN and Post -op Vital signs reviewed and stable  Post vital signs: Reviewed and stable  Last Vitals:  Vitals Value Taken Time  BP    Temp    Pulse    Resp    SpO2      Last Pain:  Vitals:   09/29/19 1319  TempSrc: Oral         Complications: No apparent anesthesia complications

## 2019-09-29 NOTE — Op Note (Signed)
NAME: Rebecca Webster, Rebecca Webster MEDICAL RECORD [id number] ACCOUNT [id number] DATE OF BIRTH:10/25/1980 FACILITY: [facility] LOCATION: [facility] PHYSICIAN:Niaya Hickok A. Aditri Louischarles, MD  OPERATIVE REPORT  DATE OF PROCEDURE:  09/29/2019  PREOPERATIVE DIAGNOSES:   1.  Cervical incompetence. 2.  Intrauterine gestation at 12 and 6/7 weeks.  PROCEDURE:  McDonald cervical cerclage.  POSTOPERATIVE DIAGNOSES:   1.  Cervical incompetence. 2.  Intrauterine gestation at 45 and 6/7 weeks.  ANESTHESIA:  Spinal.  SURGEON:  Maxie Better, MD  ASSISTANT:  None.  DESCRIPTION OF PROCEDURE:  Under adequate spinal anesthesia, the patient was placed in the dorsal lithotomy position.  She was sterilely prepped and draped in the usual fashion.  An indwelling Foley catheter was sterilely placed.  A weighted speculum was  placed in the vagina.  Sims retractor was placed anteriorly.  The vagina was irrigated with sterile saline.  The anterior and posterior lip of the cervix was grasped with a small clamp.  The cervix had an external os that looked fingertip dilated.  Using a#1 Prolene, a McDonald cerclage was performed in the usual manner starting at the 12 o'clock position.  An Ethibond suture was used for tagging.  The knot was tied at the 12 o'clock position.  The cervix appeared closed.  The procedure was then  terminated by removing all instruments from the vagina.  SPECIMEN:  None.  ESTIMATED BLOOD LOSS:  10 mL.  COMPLICATIONS:  None.  DISPOSITION:  The patient tolerated the procedure well and was transferred to recovery room in stable condition.  VN/NUANCE  D:09/29/2019 T:09/29/2019 JOB:[id number]

## 2019-09-29 NOTE — Anesthesia Preprocedure Evaluation (Signed)
Anesthesia Evaluation  Patient identified by MRN, date of birth, ID band Patient awake    Reviewed: Allergy & Precautions, NPO status , Patient's Chart, lab work & pertinent test results  Airway Mallampati: II  TM Distance: >3 FB Neck ROM: Full    Dental no notable dental hx.    Pulmonary neg pulmonary ROS,    Pulmonary exam normal breath sounds clear to auscultation       Cardiovascular negative cardio ROS Normal cardiovascular exam Rhythm:Regular Rate:Normal     Neuro/Psych negative neurological ROS  negative psych ROS   GI/Hepatic negative GI ROS, Neg liver ROS,   Endo/Other  negative endocrine ROSObese BMI 35  Renal/GU negative Renal ROS  negative genitourinary   Musculoskeletal negative musculoskeletal ROS (+)   Abdominal   Peds  Hematology negative hematology ROS (+)   Anesthesia Other Findings   Reproductive/Obstetrics (+) Pregnancy                             Anesthesia Physical Anesthesia Plan  ASA: II  Anesthesia Plan: Spinal   Post-op Pain Management:    Induction:   PONV Risk Score and Plan: Treatment may vary due to age or medical condition  Airway Management Planned: Natural Airway  Additional Equipment:   Intra-op Plan:   Post-operative Plan:   Informed Consent: I have reviewed the patients History and Physical, chart, labs and discussed the procedure including the risks, benefits and alternatives for the proposed anesthesia with the patient or authorized representative who has indicated his/her understanding and acceptance.     Dental advisory given  Plan Discussed with: CRNA  Anesthesia Plan Comments:         Anesthesia Quick Evaluation

## 2019-09-30 ENCOUNTER — Other Ambulatory Visit: Payer: Self-pay

## 2019-09-30 DIAGNOSIS — N883 Incompetence of cervix uteri: Secondary | ICD-10-CM | POA: Diagnosis not present

## 2019-10-03 ENCOUNTER — Inpatient Hospital Stay (HOSPITAL_COMMUNITY)
Admission: AD | Admit: 2019-10-03 | Discharge: 2019-10-03 | Disposition: A | Discharge status: Home / Self Care | Payer: 59 | Attending: Obstetrics and Gynecology | Admitting: Obstetrics and Gynecology

## 2019-10-03 ENCOUNTER — Other Ambulatory Visit: Payer: Self-pay

## 2019-10-03 ENCOUNTER — Encounter (HOSPITAL_COMMUNITY): Payer: Self-pay | Admitting: Obstetrics and Gynecology

## 2019-10-03 DIAGNOSIS — Z3A14 14 weeks gestation of pregnancy: Secondary | ICD-10-CM | POA: Diagnosis not present

## 2019-10-03 DIAGNOSIS — O09522 Supervision of elderly multigravida, second trimester: Secondary | ICD-10-CM | POA: Insufficient documentation

## 2019-10-03 DIAGNOSIS — N3289 Other specified disorders of bladder: Secondary | ICD-10-CM | POA: Diagnosis not present

## 2019-10-03 DIAGNOSIS — R350 Frequency of micturition: Secondary | ICD-10-CM | POA: Insufficient documentation

## 2019-10-03 DIAGNOSIS — O3432 Maternal care for cervical incompetence, second trimester: Secondary | ICD-10-CM | POA: Diagnosis not present

## 2019-10-03 DIAGNOSIS — O26892 Other specified pregnancy related conditions, second trimester: Secondary | ICD-10-CM | POA: Insufficient documentation

## 2019-10-03 LAB — URINALYSIS, ROUTINE W REFLEX MICROSCOPIC
Bilirubin Urine: NEGATIVE
Glucose, UA: NEGATIVE mg/dL
Hgb urine dipstick: NEGATIVE
Ketones, ur: 20 mg/dL — AB
Leukocytes,Ua: NEGATIVE
Nitrite: NEGATIVE
Protein, ur: NEGATIVE mg/dL
Specific Gravity, Urine: 1.009 (ref 1.005–1.030)
pH: 7 (ref 5.0–8.0)

## 2019-10-03 MED ORDER — PHENAZOPYRIDINE HCL 100 MG PO TABS
200.0000 mg | ORAL_TABLET | Freq: Once | ORAL | Status: AC
  Administered 2019-10-03: 200 mg via ORAL
  Filled 2019-10-03: qty 2

## 2019-10-03 NOTE — MAU Provider Note (Signed)
History     CSN: [id number]  Arrival date and time: 10/03/19 1558   First Provider Initiated Contact with Patient 10/03/19 1720      Chief Complaint  Patient presents with  . Urinary Frequency  . burning in abdomen   Rebecca Webster is a 39 y.o. [phone] at [redacted]w[redacted]d who presents to MAU for urinary frequency with burning sensation in LLQ and suprapubic area. Pt had McDonald cerclage placed 09/29/2019. Pt reports she had a urinary catheter during procedure.  Onset: after procedure 09/29/2019 Location: LLQ/suprapubic Duration: 4 days Character: burning, spasm, frequency of urination, pressure Aggravating/Associated: eating food/none Relieving: none Treatment: Mylanta - did not work Severity: 0/10  Pt denies VB, LOF, ctx, vaginal discharge/odor/itching. Pt denies N/V, abdominal pain, constipation, diarrhea, or urinary problems. Pt denies fever, chills, fatigue, sweating or changes in appetite. Pt denies SOB or chest pain. Pt denies dizziness, HA, light-headedness, weakness.  Problems this pregnancy include: cervical insufficiency, hx PTL. Allergies? NKDA Current medications/supplements? PNVs, Fe, Folic Acid Prenatal care provider? Dr. Garwin Brothers (unavailable until 5pm 10/04/2019), next appt 10/06/2019   OB History    Gravida  7   Para  4   Term  3   Preterm  1   AB  2   Living  5     SAB  1   TAB      Ectopic  1   Multiple  1   Live Births  5           Past Medical History:  Diagnosis Date  . Anemia    not currently anemic  . Medical history non-contributory     Past Surgical History:  Procedure Laterality Date  . abdominalplasty    . CERVICAL CERCLAGE N/A 05/06/2019   Procedure: CERCLAGE CERVICAL;  Surgeon: Servando Salina, MD;  Location: [facility];  Service: Gynecology;  Laterality: N/A;  . CERVICAL CERCLAGE N/A 09/29/2019   Procedure: CERCLAGE CERVICAL;  Surgeon: Servando Salina, MD;  Location: [facility];  Service: Gynecology;   Laterality: N/A;  EDD: 03/28/20  . CESAREAN SECTION    . TUBAL LIGATION    . tubalr eversal      Family History  Problem Relation Age of Onset  . Heart disease Maternal Grandmother     Social History   Tobacco Use  . Smoking status: Never Smoker  . Smokeless tobacco: Never Used  Substance Use Topics  . Alcohol use: No  . Drug use: No    Allergies: No Known Allergies  Medications Prior to Admission  Medication Sig Dispense Refill Last Dose  . alum & mag hydroxide-simeth (MAALOX/MYLANTA) 200-200-20 MG/5ML suspension Take 30 mLs by mouth every 6 (six) hours as needed for indigestion or heartburn.     Marland Kitchen IRON-B12-VITAMINS PO Take 1 capsule by mouth daily.     . Prenatal Vit-Fe Fumarate-FA (PRENATAL MULTIVITAMIN) TABS tablet Take 1 tablet by mouth daily at 12 noon.       Review of Systems  Constitutional: Negative for chills, diaphoresis, fatigue and fever.  Eyes: Negative for visual disturbance.  Respiratory: Negative for shortness of breath.   Cardiovascular: Negative for chest pain.  Gastrointestinal: Positive for abdominal pain. Negative for constipation, diarrhea, nausea and vomiting.  Genitourinary: Positive for dysuria and frequency. Negative for flank pain, pelvic pain, urgency, vaginal bleeding and vaginal discharge.  Neurological: Negative for dizziness, weakness, light-headedness and headaches.   Physical Exam   Blood pressure 140/70, pulse 91, temperature 98.6 F (37 C), temperature source  Oral, resp. rate 16, height 5\' 10"  (1.778 m), weight 112.3 kg, last menstrual period 12/28/2018, SpO2 100 %, currently breastfeeding.  Patient Vitals for the past 24 hrs:  BP Temp Temp src Pulse Resp SpO2 Height Weight  10/03/19 1641 140/70 98.6 F (37 C) Oral 91 16 100 % 5\' 10"  (1.778 m) 112.3 kg   Physical Exam  Constitutional: She is oriented to person, place, and time. She appears well-developed and well-nourished. No distress.  HENT:  Head: Normocephalic and atraumatic.   Respiratory: Effort normal.  GI: Soft. She exhibits no distension and no mass. There is abdominal tenderness in the right lower quadrant and left lower quadrant. There is no rebound and no guarding.  Pt describes soreness on palpation in LLQ and RLQ, reports pressure on palpation in suprapubic area.  Genitourinary:    No vaginal discharge.   Neurological: She is alert and oriented to person, place, and time.  Skin: Skin is warm and dry. She is not diaphoretic.  Psychiatric: She has a normal mood and affect. Her behavior is normal. Judgment and thought content normal.  FHT  153  Results for orders placed or performed during the hospital encounter of 10/03/19 (from the past 24 hour(s))  Urinalysis, Routine w reflex microscopic     Status: Abnormal   Collection Time: 10/03/19  4:46 PM  Result Value Ref Range   Color, Urine YELLOW YELLOW   APPearance CLEAR CLEAR   Specific Gravity, Urine 1.009 1.005 - 1.030   pH 7.0 5.0 - 8.0   Glucose, UA NEGATIVE NEGATIVE mg/dL   Hgb urine dipstick NEGATIVE NEGATIVE   Bilirubin Urine NEGATIVE NEGATIVE   Ketones, ur 20 (A) NEGATIVE mg/dL   Protein, ur NEGATIVE NEGATIVE mg/dL   Nitrite NEGATIVE NEGATIVE   Leukocytes,Ua NEGATIVE NEGATIVE    MAU Course  Procedures  MDM -urinary frequency/dysuria with burning sensation in LLQ and pressure in suprapubic area s/p McDonald cerclage placement on 09/29/2019 -UA: 20ketones, otherwise WNL, urine sent for culture based on symptoms -consulted with Dr. 10/05/19 about patient presentation and recent cerclage. Per Dr. 10/01/2019, if symptoms resolved with pyridium, not necessary to check cerclage and can assume is irritation from indwelling catheter during surgical procedure. -pyridium 200mg  given, pt reports symptoms now improved. Pt reports spasm in suprapubic area and dysuria sensation has been relieved and burning sensation in LLQ has improved, but is not resolved. Pt reports she was advised she had something going on  with her bowels in the LLQ on a previous office [place] Loop, but has not been given an official diagnosis. Pt now also reporting slight burning sensation in sternal area. -consulted with Dr. Debroah Loop @816PM , pt OK to be discharged home without check of cervical cerclage. -pt discharged to home in stable condition  Orders Placed This Encounter  Procedures  . Culture, OB Urine    Standing Status:   Standing    Number of Occurrences:   1  . Urinalysis, Routine w reflex microscopic    Standing Status:   Standing    Number of Occurrences:   1  . Discharge patient    Order Specific Question:   Discharge disposition    Answer:   01-Home or Self Care [1]    Order Specific Question:   Discharge patient date    Answer:   10/03/2019   Meds ordered this encounter  Medications  . phenazopyridine (PYRIDIUM) tablet 200 mg   Assessment and Plan   1. Bladder irritation   2. Cervical incompetence affecting  management of pregnancy in second trimester, antepartum   3. Cervical cerclage suture present in second trimester   4. [redacted] weeks gestation of pregnancy    Allergies as of 10/03/2019   No Known Allergies     Medication List    TAKE these medications   alum & mag hydroxide-simeth 200-200-20 MG/5ML suspension Commonly known as: MAALOX/MYLANTA Take 30 mLs by mouth every 6 (six) hours as needed for indigestion or heartburn.   IRON-B12-VITAMINS PO Take 1 capsule by mouth daily.   prenatal multivitamin Tabs tablet Take 1 tablet by mouth daily at 12 noon.      -pt offered RX for pyridium, declines stating will purchase Azo OTC, pt advised not to take longer than 2 days -pt to discuss bowel issues with provider at visit on 10/06/2019 -strict PTL/bleeding/pain/discharge/return MAU precautions given -pt discharged to home in stable condition  Odie Sera Ilia Dimaano 10/03/2019, 8:36 PM

## 2019-10-03 NOTE — Discharge Instructions (Signed)
Preterm Labor and Birth Information ° °The normal length of a pregnancy is 39-41 weeks. Preterm labor is when labor starts before 37 completed weeks of pregnancy. °What are the risk factors for preterm labor? °Preterm labor is more likely to occur in women who: °· Have certain infections during pregnancy such as a bladder infection, sexually transmitted infection, or infection inside the uterus (chorioamnionitis). °· Have a shorter-than-normal cervix. °· Have gone into preterm labor before. °· Have had surgery on their cervix. °· Are younger than age 17 or older than age 35. °· Are African American. °· Are pregnant with twins or multiple babies (multiple gestation). °· Take street drugs or smoke while pregnant. °· Do not gain enough weight while pregnant. °· Became pregnant shortly after having been pregnant. °What are the symptoms of preterm labor? °Symptoms of preterm labor include: °· Cramps similar to those that can happen during a menstrual period. The cramps may happen with diarrhea. °· Pain in the abdomen or lower back. °· Regular uterine contractions that may feel like tightening of the abdomen. °· A feeling of increased pressure in the pelvis. °· Increased watery or bloody mucus discharge from the vagina. °· Water breaking (ruptured amniotic sac). °Why is it important to recognize signs of preterm labor? °It is important to recognize signs of preterm labor because babies who are born prematurely may not be fully developed. This can put them at an increased risk for: °· Long-term (chronic) heart and lung problems. °· Difficulty immediately after birth with regulating body systems, including blood sugar, body temperature, heart rate, and breathing rate. °· Bleeding in the brain. °· Cerebral palsy. °· Learning difficulties. °· Death. °These risks are highest for babies who are born before 34 weeks of pregnancy. °How is preterm labor treated? °Treatment depends on the length of your pregnancy, your condition,  and the health of your baby. It may involve: °· Having a stitch (suture) placed in your cervix to prevent your cervix from opening too early (cerclage). °· Taking or being given medicines, such as: °? Hormone medicines. These may be given early in pregnancy to help support the pregnancy. °? Medicine to stop contractions. °? Medicines to help mature the baby’s lungs. These may be prescribed if the risk of delivery is high. °? Medicines to prevent your baby from developing cerebral palsy. °If the labor happens before 34 weeks of pregnancy, you may need to stay in the hospital. °What should I do if I think I am in preterm labor? °If you think that you are going into preterm labor, call your health care provider right away. °How can I prevent preterm labor in future pregnancies? °To increase your chance of having a full-term pregnancy: °· Do not use any tobacco products, such as cigarettes, chewing tobacco, and e-cigarettes. If you need help quitting, ask your health care provider. °· Do not use street drugs or medicines that have not been prescribed to you during your pregnancy. °· Talk with your health care provider before taking any herbal supplements, even if you have been taking them regularly. °· Make sure you gain a healthy amount of weight during your pregnancy. °· Watch for infection. If you think that you might have an infection, get it checked right away. °· Make sure to tell your health care provider if you have gone into preterm labor before. °This information is not intended to replace advice given to you by your health care provider. Make sure you discuss any questions you have with your   health care provider. Document Revised: 11/14/2018 Document Reviewed: 12/14/2015 Elsevier Patient Education  2020 [employer] Cervical Cerclage  Cervical cerclage is a surgical procedure to correct a cervix that opens up and thins out before pregnancy is at term. This is also called cervical insufficiency, or  incompetent cervix. This condition can cause labor to start early (prematurely). In this procedure, a health care provider uses stitches (sutures) to sew the cervix shut during pregnancy. Your health care provider may use ultrasound to help guide the procedure and monitor your baby. Ultrasound uses sound waves to take images of your cervix and uterus. The health care provider will assess these images on a monitor in the operating room. Tell a health care provider about:  Any allergies you have, especially any allergies related to prescribed medicine, stitches, or anesthetic medicines.  Any problems you or family members have had with anesthetic medicines.  Any blood disorders you have.  Any surgeries you have had, including prior cervical stitching.  Any medical conditions you have or have had. What are the risks? Generally, this is a safe procedure. However, problems may occur, including:  Infection, such as infection of the cervix or the bag of fluid that surrounds the baby (amniotic sac).  Vaginal bleeding.  Allergic reactions to medicines.  Damage to nearby structures or organs, such as injury to the cervix or tearing of the amniotic sac.  Contractions that come too early, including going into early labor and delivery.  Cervical dystocia. This occurs when the cervix is unable to open normally during labor. What happens before the procedure? Staying hydrated Follow instructions from your health care provider about hydration, which may include:  Up to 2 hours before the procedure - you may continue to drink clear liquids, such as water, clear fruit juice, black coffee, and plain tea.  Eating and drinking restrictions Follow instructions from your health care provider about eating and drinking, which may include:  8 hours before the procedure - stop eating heavy meals or foods, such as meat, fried foods, or fatty foods.  6 hours before the procedure - stop eating light meals or  foods, such as toast or cereal.  6 hours before the procedure - stop drinking milk or drinks that contain milk.  2 hours before the procedure - stop drinking clear liquids. Medicines Ask your health care provider about:  Changing or stopping your regular medicines. This is especially important if you are taking diabetes medicines or blood thinners.  Taking medicines such as aspirin and ibuprofen. These medicines can thin your blood. Do not take these medicines unless your health care provider tells you to take them.  Taking over-the-counter medicines, vitamins, herbs, and supplements. Surgery safety Ask your health care provider:  How your surgery site will be marked.  What steps will be taken to help prevent infection. These may include: ? Removing hair at the surgery site. ? Washing skin with a germ-killing soap. ? Taking antibiotic medicine. General instructions  Do not put on any lotion, deodorant, or perfume.  Remove contact lenses and jewelry.  You may have an exam or testing, including blood or urine tests.  Plan to have someone take you home from the hospital or clinic.  If you will be going home right after the procedure, plan to have someone with you for 24 hours. What happens during the procedure?  An IV will be inserted into one of your veins.  You may be given one or more of the following: ?  A medicine to help you relax (sedative). ? A medicine to numb the area (local anesthetic). ? A medicine to make you fall asleep (general anesthetic). ? A medicine that is injected into your spine to numb the area below and slightly above the injection site (spinal anesthetic).  A lubricated instrument (speculum) will be inserted into your vagina. The speculum will be widened to open the walls of your vagina so your surgeon can see your cervix.  Your cervix will be grasped and tightly sutured to close it. To do this, your surgeon will stitch a strong band of thread around  your cervix, then the thread will be tightened to hold your cervix shut. The procedure may vary among health care providers and hospitals. What happens after the procedure?  Your blood pressure, heart rate, breathing rate, and blood oxygen level will be monitored until you leave the hospital or clinic.  You will be monitored for premature contractions.  You may have light bleeding and mild cramping.  You may have to wear compression stockings. These stockings help to prevent blood clots and reduce swelling in your legs.  If you were given a sedative during the procedure, it can affect you for several hours. Do not drive or operate machinery until your health care provider says that it is safe.  You may be put on bed rest.  You may be given an injection of a hormone (progesterone) to prevent premature contractions. Summary  Cervical cerclage is a surgical procedure in which stitches are used to sew the cervix shut during pregnancy.  Before the procedure, tell your health care provider about your medicines, or medical problems or blood disorders that you have.  This is a safe procedure. However, problems may occur, including infection, bleeding, or premature labor.  Follow all instructions about eating and drinking before the procedure. Plan to have someone drive you home from the hospital or clinic. This information is not intended to replace advice given to you by your health care provider. Make sure you discuss any questions you have with your health care provider. Document Revised: 05/19/2019 Document Reviewed: 03/18/2019 Elsevier Patient Education  2020 [employer].

## 2019-10-03 NOTE — MAU Note (Signed)
Patient had cerclage placed a few days ago.  Noticed lower abdominal "burning" feeling and burning after urination since yesterday.  Pain worse today.  Feeling urinary frequency.  Denies blood in urine.

## 2019-10-04 LAB — CULTURE, OB URINE: Culture: NO GROWTH

## 2019-10-21 ENCOUNTER — Encounter (HOSPITAL_COMMUNITY): Payer: Self-pay

## 2019-10-26 ENCOUNTER — Other Ambulatory Visit (HOSPITAL_COMMUNITY): Payer: Self-pay | Admitting: Obstetrics and Gynecology

## 2019-10-26 ENCOUNTER — Encounter (HOSPITAL_COMMUNITY): Payer: Self-pay

## 2019-10-26 ENCOUNTER — Ambulatory Visit (HOSPITAL_COMMUNITY): Payer: Self-pay | Admitting: Genetic Counselor

## 2019-10-26 ENCOUNTER — Other Ambulatory Visit: Payer: Self-pay

## 2019-10-26 ENCOUNTER — Ambulatory Visit (HOSPITAL_COMMUNITY): Payer: 59 | Attending: Obstetrics and Gynecology | Admitting: Genetic Counselor

## 2019-10-26 ENCOUNTER — Ambulatory Visit (HOSPITAL_BASED_OUTPATIENT_CLINIC_OR_DEPARTMENT_OTHER)
Admission: RE | Admit: 2019-10-26 | Discharge: 2019-10-26 | Disposition: A | Discharge status: Home / Self Care | Payer: 59 | Source: Ambulatory Visit | Attending: Obstetrics and Gynecology | Admitting: Obstetrics and Gynecology

## 2019-10-26 ENCOUNTER — Ambulatory Visit (HOSPITAL_COMMUNITY): Payer: 59 | Admitting: *Deleted

## 2019-10-26 ENCOUNTER — Other Ambulatory Visit (HOSPITAL_COMMUNITY): Payer: Self-pay | Admitting: *Deleted

## 2019-10-26 VITALS — BP 130/80 | HR 81 | Temp 97.3°F | Wt 255.0 lb

## 2019-10-26 DIAGNOSIS — O0943 Supervision of pregnancy with grand multiparity, third trimester: Secondary | ICD-10-CM | POA: Diagnosis not present

## 2019-10-26 DIAGNOSIS — D563 Thalassemia minor: Secondary | ICD-10-CM | POA: Diagnosis not present

## 2019-10-26 DIAGNOSIS — O09292 Supervision of pregnancy with other poor reproductive or obstetric history, second trimester: Secondary | ICD-10-CM

## 2019-10-26 DIAGNOSIS — O09293 Supervision of pregnancy with other poor reproductive or obstetric history, third trimester: Secondary | ICD-10-CM | POA: Insufficient documentation

## 2019-10-26 DIAGNOSIS — Z148 Genetic carrier of other disease: Secondary | ICD-10-CM

## 2019-10-26 DIAGNOSIS — O09522 Supervision of elderly multigravida, second trimester: Secondary | ICD-10-CM | POA: Diagnosis not present

## 2019-10-26 DIAGNOSIS — Z3A18 18 weeks gestation of pregnancy: Secondary | ICD-10-CM

## 2019-10-26 DIAGNOSIS — O280 Abnormal hematological finding on antenatal screening of mother: Secondary | ICD-10-CM | POA: Diagnosis not present

## 2019-10-26 DIAGNOSIS — Z363 Encounter for antenatal screening for malformations: Secondary | ICD-10-CM | POA: Diagnosis not present

## 2019-10-26 DIAGNOSIS — O09523 Supervision of elderly multigravida, third trimester: Secondary | ICD-10-CM | POA: Diagnosis present

## 2019-10-26 DIAGNOSIS — O26893 Other specified pregnancy related conditions, third trimester: Secondary | ICD-10-CM | POA: Insufficient documentation

## 2019-10-26 DIAGNOSIS — Z315 Encounter for genetic counseling: Secondary | ICD-10-CM | POA: Diagnosis not present

## 2019-10-26 DIAGNOSIS — O285 Abnormal chromosomal and genetic finding on antenatal screening of mother: Secondary | ICD-10-CM | POA: Diagnosis not present

## 2019-10-26 DIAGNOSIS — Z8616 Personal history of COVID-19: Secondary | ICD-10-CM | POA: Diagnosis not present

## 2019-10-26 DIAGNOSIS — O34219 Maternal care for unspecified type scar from previous cesarean delivery: Secondary | ICD-10-CM

## 2019-10-26 DIAGNOSIS — Z362 Encounter for other antenatal screening follow-up: Secondary | ICD-10-CM

## 2019-10-26 DIAGNOSIS — O0990 Supervision of high risk pregnancy, unspecified, unspecified trimester: Secondary | ICD-10-CM

## 2019-10-26 HISTORY — DX: Unspecified ectopic pregnancy without intrauterine pregnancy: O00.90

## 2019-10-26 NOTE — Progress Notes (Signed)
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 Rebecca Webster came to her appointment alone due to COVID-19 visitor restrictions.   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 female. Her current pregnancy has completed [redacted]w[redacted]d (Estimated Date of Delivery: 03/28/20).  Rebecca Webster denied exposure to environmental toxins or chemical agents. She denied the use of alcohol, tobacco or street drugs. She reported taking prenatal vitamins, iron, and folate. She denied significant viral illnesses and fevers during the course of her pregnancy. She reported bleeding in the first trimester. Rebecca Webster has previously had an ectopic pregnancy and a miscarriage at [redacted]w[redacted]d due to cervical incompetence. Her medical and surgical histories were otherwise noncontributory.  Family History  A three generation pedigree was drafted and reviewed. Both family histories were reviewed and found to be noncontributory for birth defects, intellectual disability, recurrent pregnancy loss, and known genetic conditions. Rebecca Webster had limited information about her partner's paternal family history; thus, risk assessment was limited.   The patient's ethnicity is [place]. The father of the pregnancy's ethnicity is [place]. Ashkenazi Jewish ancestry and consanguinity were denied. Pedigree will be scanned under Media.  Discussion  Rebecca Webster noninvasive prenatal screening  (NIPS) through [employer] high risk due toinsufficientfetal DNA fraction. Rebecca Webster had two blood draws performed for NIPS.She received no resultfor her first sample due to insufficient fetal DNA, with a fetal fraction of 2.7% at [redacted]w[redacted]d. Given the insufficient fetal fraction from her first sample, an additional analysis was performed by Johnsie Cancel that identified the pregnancy as being at high risk (1 in 17, or ~6%) for triploidy, trisomy 60, and trisomy 69. However, Rebecca Webster's second sample drawn at [redacted]w[redacted]d had a sufficient amount of fetal DNA. Results from this sample demonstrated a less than 1 in 10,000 risk for trisomies 21, 18 and 13, and monosomy X (Turner syndrome). In addition, the risk for triploidy and sex chromosome trisomies (47,XXX and 47,XXY) was also low. Rebecca Webster elected to have cfDNA analysis for 22q11.2 deletion syndrome, which was also low risk (1 in 2900).   Panorama NIPS utilizessingle nucleotide polymorphisms (SNPs)to distinguish maternal from fetal (placental) DNA. The term "fetal fraction" refers to the amount of sample that is believed to have come from fetal DNA rather than maternal DNA.We reviewed that there are many possible reasons a sample may have a low fetal fraction, including early gestational age, high maternal BMI, suboptimal sample collection, maternal use of medications like low molecular weight heparin, pregnancy loss, pregnancy complications, and normal variation. Pregnancies affected by triploidy, trisomy 38, and trisomy 46 have all been associated with low fetal fraction on NIPS as well; however,there is no increased incidence of trisomy 5 or monosomy X in cases with low fetal fraction. It is thought that pregnancies with triploidy, trisomy 37, and trisomy 18 may have smaller placentas, which could result in an insufficient fetal fraction. Since Rebecca Webster's second sample contained sufficient fetal DNA, her original sample may have had insufficient  fetal DNA due to a combination of factors other than fetal trisomy 59, trisomy  18, or triploidy.  We also reviewed that Rebecca Webster had Horizon-14 carrier screening performed through [place]. The results of that screen identified her as a carrier for beta-thalassemia.   Beta-thalassemia is a disorder that reduces the production of hemoglobin. Hemoglobin is a protein that transports oxygen from the lungs to organs and tissues throughout the body. Individuals with beta-thalassemia have lower levels of hemoglobin that leads to a lack of oxygen in many parts of the body. Affected individuals may also have anemia, or a shortage of red blood cells, which can cause pale skin, weakness, fatigue, and more serious complications. Individuals with beta-thalassemia are also at increased risk of developing abnormal blood clots.   Beta-thalassemia is classified into two subtypes depending on the severity of an individual's symptoms. Thalassemia major, AKA Cooley's anemia, is the more severe subtype. Children with thalassemia major develop life-threatening anemia within the first two years of life. Symptoms in children with thalassemia major may include failure to thrive, jaundice, hepatosplenomegaly (enlarged liver and spleen), cardiomegaly (enlarged heart), thalassemia-related bony changes, and delayed puberty. Individuals with thalassemia major often require frequent blood transfusions to replenish their supply of red blood cells. Over time, an influx of iron-containing hemoglobin from chronic blood transfusions can lead to iron overload in the body, resulting in liver, heart, and hormone problems in affected individuals. Thalassemia intermedia is the milder subtype of beta-thalassemia. Onset of thalassemia intermedia symptoms occurs in early childhood or later in life. Affected individuals may experience mild to moderate anemia, slow growth, and thalassemia-related bony changes.    Beta-thalassemia is caused by changes  in the HBB gene. The HBB gene provides instructions for creating a protein called beta-globin, which is a component of hemoglobin. Some pathogenic variants in the HBB gene prevent the production of any beta-globin in the body. The condition caused by the absence of beta-globin is referred to as beta-zero (?0) thalassemia. Other pathogenic variants in the HBB gene allow for the production of a reduced amount of beta-globin in the body. The condition caused by a reduced amount of beta-globin is referred to as beta-plus (?+) thalassemia. A deficiency or complete lack of beta-globin leads to a reduced amount of functional hemoglobin in the body, causing a shortage of mature red blood cells. This results in the features associated with beta-thalassemia. The amount of beta-globin that is produced in the body does not necessarily predict disease severity, as individuals with both ?0 and ?+ thalassemia have been diagnosed with both clinical subtypes of beta-thalassemia.   Beta-thalassemia is inherited in an autosomal recessive pattern. This means that the current fetus is only at risk for beta-thalassemia if Ms. Keshishyan's partner is also a carrier for the condition. Based on the carrier frequency for beta-thalassemia inthe African American population, Ms. Morency's partner has a 1 in 68 chance of being a carrier for beta-thalassemia. Thus, without partner carrier screening to refine risk and based on ethnicity alone, the couple currently has a 1 in 388 (0.3%) chance of having a child with beta-thalassemia. If Ms. Skeels's partner is also a carrier, the chance for beta-thalassemia in the current fetus would be 1 in 4 (25%).    It is also possible that Ms. Natt's partner could have a different variant in the HBB gene that could make him a carrier of sickle cell disease, AKA sickle cell anemia (SCA). If he did, the couple would have a 1 in 4 (25%) chance of having a child with a condition called sickle beta  thalassemia. The severity of  sickle beta thalassemia depends on the normal amount of beta globin that is produced. If an individual produces no beta globin (sickle beta-zero thalassemia), they will experience symptoms similar to SCA. If an individual produces a reduced amount of beta globin (sickle beta-plus thalassemia), they may experience symptoms that are similar to a milder form of SCA. Individuals with SCA have red blood cells that can sickle and obstruct blood flow in small blood vessels, causing ischemia of tissues and organs and episodes of vaso-occlusive crisis. Additional complications associated with SCA may include organ damage, frequent infections, acute chest syndrome, ischemic stroke, splenic sequestration, priapsim, and pulmonary hypertension. Based on the carrier frequency for SCA in the African American population, Ms. Lohmann's partner has a 1 in 11 chance of being a carrier for SCA. Thus, without partner carrier screening to refine risk and based on ethnicity alone, the couple currently has a 1 in 44 (2.2%) chance of having a child with sickle beta thalassemia.  Ms. Kuehnle carrier screening was negative for the other 13 conditions screened. Thus, her risk to be a carrier for these additional conditions (listed separately in the laboratory report) has been reduced but not eliminated. This also significantly reduces her risk of having a child affected by one of these conditions. We discussed that carrier testing for beta-thalassemia and SCA is recommended for Ms. Visconti's partner. Ms. Fishbaugh indicated that she is not interested in pursuing partner carrier screening. She stated that even if her partner were identified as a carrier and her fetus had a chance of being affected by one of the described conditions, it would not impact her pregnancy management. She preferred to wait until the postnatal period to pursue testing if indicated. We also discussed that cases of beta-thalassemia  major and sickle beta-zero thalassemia are able to be detected on newborn screening. However, thalassemia intermedia and sickle beta-plus thalassemia are often more difficult to detect on newborn screening.  A complete ultrasound was performed today prior to our visit. The ultrasound report will be sent under separate cover. There were no visualized fetal anomalies or markers suggestive of aneuploidy.  Ms. Mcgahan was also counseled regarding diagnostic testing via amniocentesis. We discussed the technical aspects of the procedure and quoted up to a 1 in 500 (0.2%) risk for spontaneous pregnancy loss or other adverse pregnancy outcomes as a result of amniocentesis. Cultured cells from an amniocentesis sample allow for the visualization of a fetal karyotype, which can detect >99% of chromosomal aberrations. Chromosomal microarray can also be performed to identify smaller deletions or duplications of fetal chromosomal material. Amniocentesis could also be performed to assess whether the baby is affected by beta-thalassemia or sickle beta thalassemia. After careful consideration, Ms. Letendre declined amniocentesis at this time. She understands that amniocentesis is available at any point after 16 weeks of pregnancy and that she may opt to undergo the procedure at a later date should she change her mind.  Lastly, screening for open neural tube defects (ONTDs) via MS-AFP in the second trimester in addition to level II ultrasound examination is recommended. Ms. Hoey level II ultrasound did not detect any ONTDs. Level II ultrasound is able to detect ONTDs with 90-95% sensitivity. However, normal ultrasound and MS-AFP results do not guarantee a normal baby, as 3-5% of newborns have some type of birth defect, many of which are not prenatally diagnosable.  Additional screening and diagnostic testing were declined today. She understands that screening tests, including ultrasound, cannot rule out all birth  defects or genetic  syndromes. The patient was advised of this limitation and states she still does not want additional testing or screening at this time.   I counseled Ms. Gammel regarding the above risks and available options. The approximate face-to-face time with the genetic counselor was 30 minutes.  In summary:  Discussed aneuploidy screening results and options for follow-up testing  First NIPS result highrisk(1 in 17) for triploidy, trisomy 48, and trisomy 4 due to low fetal fraction  Second NIPS result low-risk for fetal aneuploidy, including trisomy 29, trisomy 24, and triploidy  Discussed carrier screening results and options for follow-up testing  Carrier for beta-thalassemia  Declined partner carrier screening. Patient prefers to pursue newborn screening/postnatal testing if clinically indicated  Reviewed results of ultrasound  No fetal anomalies or markers seen  Reduction in risk for fetal aneuploidy  Offered additional testing and screening  Declined amniocentesis  Recommend MS-AFP screening  Reviewed family history concerns   Gershon Crane, MS, Aeronautical engineer

## 2019-11-03 ENCOUNTER — Other Ambulatory Visit (HOSPITAL_COMMUNITY): Payer: Self-pay | Admitting: Obstetrics and Gynecology

## 2019-11-25 ENCOUNTER — Ambulatory Visit (HOSPITAL_COMMUNITY)
Admission: RE | Admit: 2019-11-25 | Discharge: 2019-11-25 | Disposition: A | Discharge status: Home / Self Care | Payer: 59 | Source: Ambulatory Visit | Attending: Obstetrics and Gynecology | Admitting: Obstetrics and Gynecology

## 2019-11-25 ENCOUNTER — Other Ambulatory Visit: Payer: Self-pay

## 2019-11-25 ENCOUNTER — Ambulatory Visit (HOSPITAL_COMMUNITY): Payer: 59 | Admitting: *Deleted

## 2019-11-25 ENCOUNTER — Other Ambulatory Visit (HOSPITAL_COMMUNITY): Payer: Self-pay | Admitting: *Deleted

## 2019-11-25 ENCOUNTER — Other Ambulatory Visit (HOSPITAL_COMMUNITY): Payer: Self-pay | Admitting: Obstetrics and Gynecology

## 2019-11-25 ENCOUNTER — Encounter (HOSPITAL_COMMUNITY): Payer: Self-pay

## 2019-11-25 VITALS — BP 146/70 | HR 95 | Temp 96.7°F

## 2019-11-25 DIAGNOSIS — O09522 Supervision of elderly multigravida, second trimester: Secondary | ICD-10-CM

## 2019-11-25 DIAGNOSIS — O34219 Maternal care for unspecified type scar from previous cesarean delivery: Secondary | ICD-10-CM

## 2019-11-25 DIAGNOSIS — Z3A22 22 weeks gestation of pregnancy: Secondary | ICD-10-CM

## 2019-11-25 DIAGNOSIS — O09529 Supervision of elderly multigravida, unspecified trimester: Secondary | ICD-10-CM

## 2019-11-25 DIAGNOSIS — O289 Unspecified abnormal findings on antenatal screening of mother: Secondary | ICD-10-CM

## 2019-11-25 DIAGNOSIS — O3432 Maternal care for cervical incompetence, second trimester: Secondary | ICD-10-CM

## 2019-11-25 DIAGNOSIS — Z362 Encounter for other antenatal screening follow-up: Secondary | ICD-10-CM

## 2019-11-25 DIAGNOSIS — O09292 Supervision of pregnancy with other poor reproductive or obstetric history, second trimester: Secondary | ICD-10-CM | POA: Diagnosis not present

## 2019-11-25 DIAGNOSIS — O26879 Cervical shortening, unspecified trimester: Secondary | ICD-10-CM

## 2019-11-25 DIAGNOSIS — Z3686 Encounter for antenatal screening for cervical length: Secondary | ICD-10-CM

## 2019-11-27 ENCOUNTER — Encounter (HOSPITAL_COMMUNITY): Payer: Self-pay | Admitting: Obstetrics and Gynecology

## 2019-11-27 ENCOUNTER — Inpatient Hospital Stay (HOSPITAL_COMMUNITY)
Admission: RE | Admit: 2019-11-27 | Discharge: 2019-11-27 | Disposition: A | Discharge status: Home / Self Care | Payer: 59 | Attending: Obstetrics and Gynecology | Admitting: Obstetrics and Gynecology

## 2019-11-27 ENCOUNTER — Other Ambulatory Visit: Payer: Self-pay

## 2019-11-27 DIAGNOSIS — O26892 Other specified pregnancy related conditions, second trimester: Secondary | ICD-10-CM | POA: Diagnosis not present

## 2019-11-27 DIAGNOSIS — O26872 Cervical shortening, second trimester: Secondary | ICD-10-CM | POA: Diagnosis not present

## 2019-11-27 DIAGNOSIS — R1030 Lower abdominal pain, unspecified: Secondary | ICD-10-CM | POA: Insufficient documentation

## 2019-11-27 DIAGNOSIS — Z3A22 22 weeks gestation of pregnancy: Secondary | ICD-10-CM | POA: Diagnosis not present

## 2019-11-27 DIAGNOSIS — O3432 Maternal care for cervical incompetence, second trimester: Secondary | ICD-10-CM | POA: Diagnosis not present

## 2019-11-27 DIAGNOSIS — O26899 Other specified pregnancy related conditions, unspecified trimester: Secondary | ICD-10-CM

## 2019-11-27 LAB — URINALYSIS, ROUTINE W REFLEX MICROSCOPIC
Bilirubin Urine: NEGATIVE
Glucose, UA: NEGATIVE mg/dL
Hgb urine dipstick: NEGATIVE
Ketones, ur: 80 mg/dL — AB
Leukocytes,Ua: NEGATIVE
Nitrite: NEGATIVE
Protein, ur: NEGATIVE mg/dL
Specific Gravity, Urine: 1.016 (ref 1.005–1.030)
pH: 5 (ref 5.0–8.0)

## 2019-11-27 MED ORDER — NIFEDIPINE 10 MG PO CAPS: 10.0000 mg | ORAL_CAPSULE | Freq: Four times a day (QID) | ORAL | 6 refills | Status: DC | PRN

## 2019-11-27 MED ORDER — INDOMETHACIN 25 MG PO CAPS
25.0000 mg | ORAL_CAPSULE | Freq: Once | ORAL | Status: AC
  Administered 2019-11-27: 25 mg via ORAL
  Filled 2019-11-27: qty 1

## 2019-11-27 MED ORDER — INDOMETHACIN 25 MG PO CAPS: 25.0000 mg | ORAL_CAPSULE | Freq: Four times a day (QID) | ORAL | 0 refills | Status: AC

## 2019-11-27 MED ORDER — TERBUTALINE SULFATE 1 MG/ML IJ SOLN
0.2500 mg | Freq: Once | INTRAMUSCULAR | Status: AC
  Administered 2019-11-27: 15:00:00 0.25 mg via SUBCUTANEOUS
  Filled 2019-11-27: qty 1

## 2019-11-27 NOTE — Discharge Instructions (Signed)
Abdominal Pain During Pregnancy  Belly (abdominal) pain is common during pregnancy. There are many possible causes. Most of the time, it is not a serious problem. Other times, it can be a sign that something is wrong with the pregnancy. Always tell your doctor if you have belly pain. Follow these instructions at home:  Do not have sex or put anything in your vagina until your pain goes away completely.  Get plenty of rest until your pain gets better.  Drink enough fluid to keep your pee (urine) pale yellow.  Take over-the-counter and prescription medicines only as told by your doctor.  Keep all follow-up visits as told by your doctor. This is important. Contact a doctor if:  Your pain continues or gets worse after resting.  You have lower belly pain that: ? Comes and goes at regular times. ? Spreads to your back. ? Feels like menstrual cramps.  You have pain or burning when you pee (urinate). Get help right away if:  You have a fever or chills.  You have vaginal bleeding.  You are leaking fluid from your vagina.  You are passing tissue from your vagina.  You throw up (vomit) for more than 24 hours.  You have watery poop (diarrhea) for more than 24 hours.  Your baby is moving less than usual.  You feel very weak or faint.  You have shortness of breath.  You have very bad pain in your upper belly. Summary  Belly (abdominal) pain is common during pregnancy. There are many possible causes.  If you have belly pain during pregnancy, tell your doctor right away.  Keep all follow-up visits as told by your doctor. This is important. This information is not intended to replace advice given to you by your health care provider. Make sure you discuss any questions you have with your health care provider. Document Revised: 11/10/2018 Document Reviewed: 10/25/2016 Elsevier Patient Education  2020 [employer]vier Inc[employer]  

## 2019-11-27 NOTE — MAU Provider Note (Signed)
History     Chief Complaint  Patient presents with   Abdominal Pain   39 yo G7P5 BF @ 22 4/[redacted] week gestation presents for evaluation due to complaint of lower abdominal tightening. Pt has cervical cerclage and was recently noted by MFM to have a shortened cervix for which intravaginal progesterone was recommended. Pt notes FM. Denies urinary sx or vaginal bleeding  OB History     Gravida  7   Para  4   Term  3   Preterm  1   AB  2   Living  5      SAB  1   TAB      Ectopic  1   Multiple  1   Live Births  5           Past Medical History:  Diagnosis Date   Anemia    not currently anemic   Ectopic pregnancy     Past Surgical History:  Procedure Laterality Date   abdominalplasty     CERVICAL CERCLAGE N/A 05/06/2019   Procedure: CERCLAGE CERVICAL;  Surgeon: Maxie Better, MD;  Location: [facility];  Service: Gynecology;  Laterality: N/A;   CERVICAL CERCLAGE N/A 09/29/2019   Procedure: CERCLAGE CERVICAL;  Surgeon: Maxie Better, MD;  Location: [facility];  Service: Gynecology;  Laterality: N/A;  EDD: 03/28/20   CESAREAN SECTION     Reversal of tubal ligation     TUBAL LIGATION     tubalr eversal      Family History  Problem Relation Age of Onset   Heart disease Maternal Grandmother    Stroke Maternal Grandmother    Cancer Father     Social History   Tobacco Use   Smoking status: Never Smoker   Smokeless tobacco: Never Used  Substance Use Topics   Alcohol use: No   Drug use: No    Allergies: No Known Allergies  Medications Prior to Admission  Medication Sig Dispense Refill Last Dose   alum & mag hydroxide-simeth (MAALOX/MYLANTA) 200-200-20 MG/5ML suspension Take 30 mLs by mouth every 6 (six) hours as needed for indigestion or heartburn.      FOLIC ACID PO Take by mouth.      IRON-B12-VITAMINS PO Take 1 capsule by mouth daily.      Prenatal Vit-Fe Fumarate-FA (PRENATAL MULTIVITAMIN) TABS tablet Take 1 tablet by mouth daily at 12 noon.         Physical Exam   Blood pressure 139/85, pulse 96, temperature 98.7 F (37.1 C), temperature source Oral, resp. rate 18, height 5\' 10"  (1.778 m), weight 118.4 kg, last menstrual period 06/22/2019, SpO2 100 %, unknown if currently breastfeeding.  General appearance: alert, cooperative, and no distress Lungs: clear to auscultation bilaterally Heart: regular rate and rhythm, S1, S2 normal, no murmur, click, rub or gallop Abdomen:  gravid soft no palp ctx Pelvic: external genitalia normal and closed/firm/2 cm palp ant cerclage ED Course  Abdominal cramping in pregnancy Cervical incompetence affecting 2nd trimester Cervical shortening in pregnancy, 2nd trimester IUP @ 22 4/7 wk P) [place] terbutaline given. Prolonged monitor for ctx.u/a, ucx MDM  Addendum> sx resolved after medication Started on indocin for 48 hrs Procardia for prn use( pt request) Keep OB appts D/c home PTL precautions 6/7, MD 2:07 PM 11/27/2019

## 2019-11-27 NOTE — MAU Note (Signed)
General tightening in lower abd, not painful.  Has a cerclage. Was started on Progesterone vag supp 2 days ago.  Called office, was told to come over for further eval.

## 2019-12-11 ENCOUNTER — Ambulatory Visit (HOSPITAL_COMMUNITY): Payer: 59 | Attending: Obstetrics

## 2019-12-11 ENCOUNTER — Encounter: Payer: Self-pay | Admitting: *Deleted

## 2019-12-11 ENCOUNTER — Ambulatory Visit: Payer: 59 | Admitting: *Deleted

## 2019-12-11 ENCOUNTER — Other Ambulatory Visit (HOSPITAL_COMMUNITY): Payer: Self-pay | Admitting: Obstetrics

## 2019-12-11 ENCOUNTER — Other Ambulatory Visit: Payer: Self-pay

## 2019-12-11 VITALS — BP 128/78 | HR 80

## 2019-12-11 DIAGNOSIS — E669 Obesity, unspecified: Secondary | ICD-10-CM

## 2019-12-11 DIAGNOSIS — O3432 Maternal care for cervical incompetence, second trimester: Secondary | ICD-10-CM

## 2019-12-11 DIAGNOSIS — O99212 Obesity complicating pregnancy, second trimester: Secondary | ICD-10-CM

## 2019-12-11 DIAGNOSIS — O09522 Supervision of elderly multigravida, second trimester: Secondary | ICD-10-CM | POA: Diagnosis not present

## 2019-12-11 DIAGNOSIS — O09292 Supervision of pregnancy with other poor reproductive or obstetric history, second trimester: Secondary | ICD-10-CM | POA: Diagnosis not present

## 2019-12-11 DIAGNOSIS — Z362 Encounter for other antenatal screening follow-up: Secondary | ICD-10-CM

## 2019-12-11 DIAGNOSIS — Z3A24 24 weeks gestation of pregnancy: Secondary | ICD-10-CM

## 2019-12-11 DIAGNOSIS — O34219 Maternal care for unspecified type scar from previous cesarean delivery: Secondary | ICD-10-CM | POA: Diagnosis not present

## 2019-12-11 DIAGNOSIS — N883 Incompetence of cervix uteri: Secondary | ICD-10-CM | POA: Insufficient documentation

## 2019-12-11 DIAGNOSIS — O289 Unspecified abnormal findings on antenatal screening of mother: Secondary | ICD-10-CM

## 2019-12-11 DIAGNOSIS — O26879 Cervical shortening, unspecified trimester: Secondary | ICD-10-CM

## 2019-12-11 NOTE — Progress Notes (Signed)
Started Prometrium and was prescribed Procardia for UC's.

## 2020-03-01 ENCOUNTER — Other Ambulatory Visit: Payer: Self-pay

## 2020-03-01 ENCOUNTER — Encounter (HOSPITAL_COMMUNITY): Payer: Self-pay | Admitting: Obstetrics & Gynecology

## 2020-03-01 ENCOUNTER — Inpatient Hospital Stay (HOSPITAL_COMMUNITY)
Admission: AD | Admit: 2020-03-01 | Discharge: 2020-03-04 | DRG: 768 | Disposition: A | Discharge status: Home / Self Care | Payer: 59 | Attending: Obstetrics & Gynecology | Admitting: Obstetrics & Gynecology

## 2020-03-01 DIAGNOSIS — Z3689 Encounter for other specified antenatal screening: Secondary | ICD-10-CM

## 2020-03-01 DIAGNOSIS — Z20822 Contact with and (suspected) exposure to covid-19: Secondary | ICD-10-CM | POA: Diagnosis present

## 2020-03-01 DIAGNOSIS — Z3A36 36 weeks gestation of pregnancy: Secondary | ICD-10-CM

## 2020-03-01 DIAGNOSIS — O34219 Maternal care for unspecified type scar from previous cesarean delivery: Secondary | ICD-10-CM | POA: Diagnosis present

## 2020-03-01 DIAGNOSIS — O26893 Other specified pregnancy related conditions, third trimester: Secondary | ICD-10-CM | POA: Diagnosis present

## 2020-03-01 DIAGNOSIS — O134 Gestational [pregnancy-induced] hypertension without significant proteinuria, complicating childbirth: Principal | ICD-10-CM | POA: Diagnosis present

## 2020-03-01 DIAGNOSIS — O3433 Maternal care for cervical incompetence, third trimester: Secondary | ICD-10-CM | POA: Diagnosis present

## 2020-03-01 DIAGNOSIS — O139 Gestational [pregnancy-induced] hypertension without significant proteinuria, unspecified trimester: Secondary | ICD-10-CM

## 2020-03-01 HISTORY — DX: Gestational (pregnancy-induced) hypertension without significant proteinuria, unspecified trimester: O13.9

## 2020-03-01 HISTORY — DX: Incompetence of cervix uteri: N88.3

## 2020-03-01 LAB — URINALYSIS, ROUTINE W REFLEX MICROSCOPIC
Bilirubin Urine: NEGATIVE
Glucose, UA: NEGATIVE mg/dL
Hgb urine dipstick: NEGATIVE
Ketones, ur: NEGATIVE mg/dL
Leukocytes,Ua: NEGATIVE
Nitrite: NEGATIVE
Protein, ur: NEGATIVE mg/dL
Specific Gravity, Urine: 1.005 (ref 1.005–1.030)
pH: 8 (ref 5.0–8.0)

## 2020-03-01 LAB — COMPREHENSIVE METABOLIC PANEL
ALT: 14 U/L (ref 0–44)
AST: 17 U/L (ref 15–41)
Albumin: 2.8 g/dL — ABNORMAL LOW (ref 3.5–5.0)
Alkaline Phosphatase: 119 U/L (ref 38–126)
Anion gap: 13 (ref 5–15)
BUN: 6 mg/dL (ref 6–20)
CO2: 19 mmol/L — ABNORMAL LOW (ref 22–32)
Calcium: 9.8 mg/dL (ref 8.9–10.3)
Chloride: 106 mmol/L (ref 98–111)
Creatinine, Ser: 0.56 mg/dL (ref 0.44–1.00)
GFR calc Af Amer: >60 mL/min (ref >60)
GFR calc non Af Amer: >60 mL/min (ref >60)
Glucose, Bld: 88 mg/dL (ref 70–99)
Potassium: 3.6 mmol/L (ref 3.5–5.1)
Sodium: 138 mmol/L (ref 135–145)
Total Bilirubin: 0.4 mg/dL (ref 0.3–1.2)
Total Protein: 6.5 g/dL (ref 6.5–8.1)

## 2020-03-01 LAB — PROTEIN / CREATININE RATIO, URINE
Creatinine, Urine: 27.79 mg/dL
Total Protein, Urine: <6 mg/dL

## 2020-03-01 LAB — CBC
HCT: 39.1 % (ref 36.0–46.0)
Hemoglobin: 12.2 g/dL (ref 12.0–15.0)
MCH: 24 pg — ABNORMAL LOW (ref 26.0–34.0)
MCHC: 31.2 g/dL (ref 30.0–36.0)
MCV: 77 fL — ABNORMAL LOW (ref 80.0–100.0)
Platelets: 231 10*3/uL (ref 150–400)
RBC: 5.08 MIL/uL (ref 3.87–5.11)
RDW: 15.4 % (ref 11.5–15.5)
WBC: 7.8 10*3/uL (ref 4.0–10.5)
nRBC: 0.3 % — ABNORMAL HIGH (ref 0.0–0.2)

## 2020-03-01 LAB — TYPE AND SCREEN
ABO/RH(D): O POS
Antibody Screen: NEGATIVE

## 2020-03-01 MED ORDER — OXYCODONE-ACETAMINOPHEN 5-325 MG PO TABS: 2.0000 | ORAL_TABLET | ORAL | Status: DC | PRN

## 2020-03-01 MED ORDER — SODIUM CHLORIDE 0.9 % IV SOLN
5.0000 10*6.[IU] | Freq: Once | INTRAVENOUS | Status: AC
  Administered 2020-03-02: 5 10*6.[IU] via INTRAVENOUS
  Filled 2020-03-01: qty 5

## 2020-03-01 MED ORDER — LABETALOL HCL 5 MG/ML IV SOLN: 80.0000 mg | INTRAVENOUS | Status: DC | PRN

## 2020-03-01 MED ORDER — LACTATED RINGERS IV SOLN: 500.0000 mL | INTRAVENOUS | Status: DC | PRN

## 2020-03-01 MED ORDER — SOD CITRATE-CITRIC ACID 500-334 MG/5ML PO SOLN: 30.0000 mL | ORAL | Status: DC | PRN

## 2020-03-01 MED ORDER — ONDANSETRON HCL 4 MG/2ML IJ SOLN: 4.0000 mg | Freq: Four times a day (QID) | INTRAMUSCULAR | Status: DC | PRN

## 2020-03-01 MED ORDER — OXYTOCIN-SODIUM CHLORIDE 30-0.9 UT/500ML-% IV SOLN
2.5000 [IU]/h | INTRAVENOUS | Status: DC
  Administered 2020-03-02: 2.5 [IU]/h via INTRAVENOUS

## 2020-03-01 MED ORDER — OXYTOCIN BOLUS FROM INFUSION
333.0000 mL | Freq: Once | INTRAVENOUS | Status: AC
  Administered 2020-03-02: 333 mL via INTRAVENOUS

## 2020-03-01 MED ORDER — OXYCODONE-ACETAMINOPHEN 5-325 MG PO TABS: 1.0000 | ORAL_TABLET | ORAL | Status: DC | PRN

## 2020-03-01 MED ORDER — PENICILLIN G POT IN DEXTROSE 60000 UNIT/ML IV SOLN
3.0000 10*6.[IU] | INTRAVENOUS | Status: DC
  Administered 2020-03-02 (×3): 3 10*6.[IU] via INTRAVENOUS
  Filled 2020-03-01 (×3): qty 50

## 2020-03-01 MED ORDER — LABETALOL HCL 5 MG/ML IV SOLN
20.0000 mg | INTRAVENOUS | Status: DC | PRN
  Administered 2020-03-01 – 2020-03-02 (×2): 20 mg via INTRAVENOUS
  Filled 2020-03-01 (×2): qty 4

## 2020-03-01 MED ORDER — LABETALOL HCL 5 MG/ML IV SOLN
40.0000 mg | INTRAVENOUS | Status: DC | PRN
  Administered 2020-03-01: 40 mg via INTRAVENOUS
  Filled 2020-03-01: qty 8

## 2020-03-01 MED ORDER — LIDOCAINE HCL (PF) 1 % IJ SOLN: 30.0000 mL | INTRAMUSCULAR | Status: DC | PRN

## 2020-03-01 MED ORDER — ACETAMINOPHEN 325 MG PO TABS: 650.0000 mg | ORAL_TABLET | ORAL | Status: DC | PRN

## 2020-03-01 MED ORDER — LACTATED RINGERS IV SOLN: INTRAVENOUS | Status: DC

## 2020-03-01 MED ORDER — HYDRALAZINE HCL 20 MG/ML IJ SOLN: 10.0000 mg | INTRAMUSCULAR | Status: DC | PRN

## 2020-03-01 NOTE — MAU Provider Note (Signed)
History     CSN: [id number]  Arrival date and time: 03/01/20 2121   First Provider Initiated Contact with Patient 03/01/20 2223      Chief Complaint  Patient presents with   Contractions   HPI Kamrin Spath is a 39 yo [id number] who presented to MAU with complaints of contractions in the context of having a prophylactic cerclage in place.  Contractions began 3 hours prior to presentation. She is having them every 7 minutes. She denies vaginal bleeding or leakage of fluid. She rates her contractions as mild. She took procardia at 7:10, however that did not stop her contractions.   She also has had two severe range blood pressures since arrival to MAU. She denies history of high blood pressure with this pregnancy. She denies SOB, HA, vision changes, or RUQ pain.  OB History    Gravida  7   Para  4   Term  3   Preterm  1   AB  2   Living  5     SAB  1   TAB      Ectopic  1   Multiple  1   Live Births  5           Past Medical History:  Diagnosis Date   Anemia    not currently anemic   Ectopic pregnancy    Gestational HTN    Incompetent cervix     Past Surgical History:  Procedure Laterality Date   abdominalplasty     CERVICAL CERCLAGE N/A 05/06/2019   Procedure: CERCLAGE CERVICAL;  Surgeon: Maxie Better, MD;  Location: [facility];  Service: Gynecology;  Laterality: N/A;   CERVICAL CERCLAGE N/A 09/29/2019   Procedure: CERCLAGE CERVICAL;  Surgeon: Maxie Better, MD;  Location: [facility];  Service: Gynecology;  Laterality: N/A;  EDD: 03/28/20   CESAREAN SECTION     Reversal of tubal ligation     TUBAL LIGATION     tubalr eversal      Family History  Problem Relation Age of Onset   Heart disease Maternal Grandmother    Stroke Maternal Grandmother    Cancer Father     Social History   Tobacco Use   Smoking status: Never Smoker   Smokeless tobacco: Never Used  Vaping Use   Vaping Use: Never used  Substance Use  Topics   Alcohol use: No   Drug use: No    Allergies: No Known Allergies  Medications Prior to Admission  Medication Sig Dispense Refill Last Dose   alum & mag hydroxide-simeth (MAALOX/MYLANTA) 200-200-20 MG/5ML suspension Take 30 mLs by mouth every 6 (six) hours as needed for indigestion or heartburn.   Past Month at Unknown time   FOLIC ACID PO Take by mouth.   03/01/2020 at Unknown time   NIFEdipine (PROCARDIA) 10 MG capsule Take 1 capsule (10 mg total) by mouth every 6 (six) hours as needed. 30 capsule 6 03/01/2020 at 1910   Prenatal Vit-Fe Fumarate-FA (PRENATAL MULTIVITAMIN) TABS tablet Take 1 tablet by mouth daily at 12 noon.   03/01/2020 at Unknown time   progesterone (PROMETRIUM) 200 MG capsule Place 200 mg vaginally daily.   02/29/2020 at Unknown time   IRON-B12-VITAMINS PO Take 1 capsule by mouth daily.   Unknown at Unknown time    Review of Systems  All other systems reviewed and are negative.  Physical Exam   Blood pressure (!) 144/96, pulse 93, temperature 98.5 F (36.9 C), temperature source Oral, resp.  rate 18, height 5\' 11"  (1.803 m), weight (!) 125.6 kg, last menstrual period 06/22/2019, SpO2 99 %, unknown if currently breastfeeding.  Physical Exam Vitals and nursing note reviewed. Exam conducted with a chaperone present.  Constitutional:      Appearance: Normal appearance.  HENT:     Head: Normocephalic and atraumatic.     Nose: Nose normal.     Mouth/Throat:     Mouth: Mucous membranes are moist.     Pharynx: Oropharynx is clear.  Eyes:     Extraocular Movements: Extraocular movements intact.     Conjunctiva/sclera: Conjunctivae normal.     Pupils: Pupils are equal, round, and reactive to light.  Cardiovascular:     Rate and Rhythm: Normal rate and regular rhythm.  Pulmonary:     Effort: Pulmonary effort is normal.     Breath sounds: Normal breath sounds.  Abdominal:     General: Bowel sounds are normal.     Comments: Gravid  Genitourinary:     Comments: Normal white vaginal discharge. Cerclage knot in place at 11 o'clock. No vaginal bleeding.  SVE: 1/soft/-3 Vertex by SVE Musculoskeletal:     Cervical back: Normal range of motion and neck supple.  Neurological:     General: No focal deficit present.     Mental Status: She is alert.  Psychiatric:        Mood and Affect: Mood normal.     MAU Course  Procedures  MDM - Due to severe range BP, Dr. 06/24/2019 was called for admission - CMP/CBC/ P:Cr ordered  NST -baseline: 140 -variability: moderate -accels: 15x15 -decels: none -interpretation: reactive  Pt informed that the ultrasound is considered a limited OB ultrasound and is not intended to be a complete ultrasound exam.  Patient also informed that the ultrasound is not being completed with the intent of assessing for fetal or placental anomalies or any pelvic abnormalities.  Explained that the purpose of todays ultrasound is to assess for  presentation.  Patient acknowledges the purpose of the exam and the limitations of the study.  Presentation is VERTEX.  Assessment and Plan  39 yo 20 at 36.1 EGA presenting to MAU with perterm contractions with cerclage in place, found to have severe range BP in MAU - Pre-E work up - Antihypertensive protocol - Care transferred to Dr. [id number] for admission  Paitlyn Mcclatchey L Donnika Kucher 03/01/2020, 10:42 PM

## 2020-03-01 NOTE — MAU Note (Signed)
Pt reports contractions all day long, now about 7 mins apart. States she called MD and was told to take Procardia. Took a dose of Procardia around 7:10pm. Was told if contractions didn't go away to come in because she has a cerclage. Denies vaginal bleeding or LOF. Reports good fetal movement. Suppose to have cerclage removed on Thursday.

## 2020-03-02 ENCOUNTER — Encounter (HOSPITAL_COMMUNITY): Payer: Self-pay | Admitting: Obstetrics & Gynecology

## 2020-03-02 ENCOUNTER — Inpatient Hospital Stay (HOSPITAL_COMMUNITY): Payer: 59 | Admitting: Anesthesiology

## 2020-03-02 ENCOUNTER — Other Ambulatory Visit: Payer: Self-pay

## 2020-03-02 DIAGNOSIS — O139 Gestational [pregnancy-induced] hypertension without significant proteinuria, unspecified trimester: Secondary | ICD-10-CM

## 2020-03-02 LAB — CBC
HCT: 37.8 % (ref 36.0–46.0)
Hemoglobin: 11.8 g/dL — ABNORMAL LOW (ref 12.0–15.0)
MCH: 23.7 pg — ABNORMAL LOW (ref 26.0–34.0)
MCHC: 31.2 g/dL (ref 30.0–36.0)
MCV: 75.9 fL — ABNORMAL LOW (ref 80.0–100.0)
Platelets: 217 10*3/uL (ref 150–400)
RBC: 4.98 MIL/uL (ref 3.87–5.11)
RDW: 15.6 % — ABNORMAL HIGH (ref 11.5–15.5)
WBC: 7.2 10*3/uL (ref 4.0–10.5)
nRBC: 0 % (ref 0.0–0.2)

## 2020-03-02 LAB — SARS CORONAVIRUS 2 BY RT PCR (HOSPITAL ORDER, PERFORMED IN ~~LOC~~ HOSPITAL LAB): SARS Coronavirus 2: NEGATIVE

## 2020-03-02 LAB — RPR: RPR Ser Ql: NONREACTIVE

## 2020-03-02 MED ORDER — PHENYLEPHRINE 40 MCG/ML (10ML) SYRINGE FOR IV PUSH (FOR BLOOD PRESSURE SUPPORT): 80.0000 ug | PREFILLED_SYRINGE | INTRAVENOUS | Status: DC | PRN

## 2020-03-02 MED ORDER — TERBUTALINE SULFATE 1 MG/ML IJ SOLN: 0.2500 mg | Freq: Once | INTRAMUSCULAR | Status: DC | PRN

## 2020-03-02 MED ORDER — TETANUS-DIPHTH-ACELL PERTUSSIS 5-2.5-18.5 LF-MCG/0.5 IM SUSP: 0.5000 mL | Freq: Once | INTRAMUSCULAR | Status: DC

## 2020-03-02 MED ORDER — BENZOCAINE-MENTHOL 20-0.5 % EX AERO: 1.0000 "application " | INHALATION_SPRAY | CUTANEOUS | Status: DC | PRN

## 2020-03-02 MED ORDER — OXYTOCIN-SODIUM CHLORIDE 30-0.9 UT/500ML-% IV SOLN
1.0000 m[IU]/min | INTRAVENOUS | Status: DC
  Administered 2020-03-02: 1 m[IU]/min via INTRAVENOUS
  Filled 2020-03-02 (×2): qty 500

## 2020-03-02 MED ORDER — PRENATAL MULTIVITAMIN CH
1.0000 | ORAL_TABLET | Freq: Every day | ORAL | Status: DC
  Administered 2020-03-03 – 2020-03-04 (×2): 1 via ORAL
  Filled 2020-03-02 (×2): qty 1

## 2020-03-02 MED ORDER — ONDANSETRON HCL 4 MG PO TABS: 4.0000 mg | ORAL_TABLET | ORAL | Status: DC | PRN

## 2020-03-02 MED ORDER — LIDOCAINE HCL (PF) 1 % IJ SOLN
INTRAMUSCULAR | Status: DC | PRN
  Administered 2020-03-02 (×2): 4 mL via EPIDURAL

## 2020-03-02 MED ORDER — LACTATED RINGERS IV SOLN: 500.0000 mL | Freq: Once | INTRAVENOUS | Status: DC

## 2020-03-02 MED ORDER — DIPHENHYDRAMINE HCL 25 MG PO CAPS: 25.0000 mg | ORAL_CAPSULE | Freq: Four times a day (QID) | ORAL | Status: DC | PRN

## 2020-03-02 MED ORDER — EPHEDRINE 5 MG/ML INJ: 10.0000 mg | INTRAVENOUS | Status: DC | PRN

## 2020-03-02 MED ORDER — WITCH HAZEL-GLYCERIN EX PADS: 1.0000 "application " | MEDICATED_PAD | CUTANEOUS | Status: DC | PRN

## 2020-03-02 MED ORDER — ZOLPIDEM TARTRATE 5 MG PO TABS: 5.0000 mg | ORAL_TABLET | Freq: Every evening | ORAL | Status: DC | PRN

## 2020-03-02 MED ORDER — FENTANYL CITRATE (PF) 100 MCG/2ML IJ SOLN
INTRAMUSCULAR | Status: DC | PRN
  Administered 2020-03-02: 15 ug via INTRATHECAL

## 2020-03-02 MED ORDER — DIPHENHYDRAMINE HCL 50 MG/ML IJ SOLN
12.5000 mg | INTRAMUSCULAR | Status: DC | PRN
  Administered 2020-03-02: 12.5 mg via INTRAVENOUS
  Filled 2020-03-02: qty 1

## 2020-03-02 MED ORDER — SODIUM CHLORIDE (PF) 0.9 % IJ SOLN
INTRAMUSCULAR | Status: DC | PRN
  Administered 2020-03-02: 12 mL/h via EPIDURAL

## 2020-03-02 MED ORDER — ACETAMINOPHEN 325 MG PO TABS
650.0000 mg | ORAL_TABLET | ORAL | Status: DC | PRN
  Administered 2020-03-03: 650 mg via ORAL
  Filled 2020-03-02: qty 2

## 2020-03-02 MED ORDER — BUPIVACAINE HCL (PF) 0.25 % IJ SOLN
INTRAMUSCULAR | Status: DC | PRN
  Administered 2020-03-02: 1 mL via INTRATHECAL

## 2020-03-02 MED ORDER — LIDOCAINE-EPINEPHRINE (PF) 2 %-1:200000 IJ SOLN
INTRAMUSCULAR | Status: DC | PRN
  Administered 2020-03-02: 7 mL via EPIDURAL

## 2020-03-02 MED ORDER — COCONUT OIL OIL
1.0000 "application " | TOPICAL_OIL | Status: DC | PRN
  Administered 2020-03-03: 1 via TOPICAL

## 2020-03-02 MED ORDER — OXYCODONE HCL 5 MG PO TABS: 10.0000 mg | ORAL_TABLET | ORAL | Status: DC | PRN

## 2020-03-02 MED ORDER — OXYCODONE HCL 5 MG PO TABS: 5.0000 mg | ORAL_TABLET | ORAL | Status: DC | PRN

## 2020-03-02 MED ORDER — SENNOSIDES-DOCUSATE SODIUM 8.6-50 MG PO TABS
2.0000 | ORAL_TABLET | ORAL | Status: DC
  Filled 2020-03-02: qty 2

## 2020-03-02 MED ORDER — IBUPROFEN 600 MG PO TABS
600.0000 mg | ORAL_TABLET | Freq: Four times a day (QID) | ORAL | Status: DC
  Administered 2020-03-03 (×2): 600 mg via ORAL
  Filled 2020-03-02 (×5): qty 1

## 2020-03-02 MED ORDER — BUTORPHANOL TARTRATE 1 MG/ML IJ SOLN
1.0000 mg | INTRAMUSCULAR | Status: DC | PRN
  Administered 2020-03-02: 1 mg via INTRAVENOUS
  Filled 2020-03-02: qty 1

## 2020-03-02 MED ORDER — SIMETHICONE 80 MG PO CHEW: 80.0000 mg | CHEWABLE_TABLET | ORAL | Status: DC | PRN

## 2020-03-02 MED ORDER — FENTANYL-BUPIVACAINE-NACL 0.5-0.125-0.9 MG/250ML-% EP SOLN
12.0000 mL/h | EPIDURAL | Status: DC | PRN
  Filled 2020-03-02: qty 250

## 2020-03-02 MED ORDER — ONDANSETRON HCL 4 MG/2ML IJ SOLN: 4.0000 mg | INTRAMUSCULAR | Status: DC | PRN

## 2020-03-02 MED ORDER — FENTANYL CITRATE (PF) 100 MCG/2ML IJ SOLN
INTRAMUSCULAR | Status: AC
  Filled 2020-03-02: qty 2

## 2020-03-02 MED ORDER — DIBUCAINE (PERIANAL) 1 % EX OINT: 1.0000 "application " | TOPICAL_OINTMENT | CUTANEOUS | Status: DC | PRN

## 2020-03-02 MED ORDER — ERYTHROMYCIN 5 MG/GM OP OINT
TOPICAL_OINTMENT | OPHTHALMIC | Status: AC
  Filled 2020-03-02: qty 1

## 2020-03-02 NOTE — Anesthesia Procedure Notes (Signed)
Epidural Patient location during procedure: OB Start time: 03/02/2020 3:41 PM End time: 03/02/2020 3:44 PM  Staffing Anesthesiologist: Kaylyn Layer, MD Performed: anesthesiologist   Preanesthetic Checklist Completed: patient identified, IV checked, risks and benefits discussed, monitors and equipment checked, pre-op evaluation and timeout performed  Epidural Patient position: sitting Prep: DuraPrep and site prepped and draped Patient monitoring: continuous pulse ox, blood pressure and heart rate Approach: midline Location: L3-L4 Injection technique: LOR air  Needle:  Needle type: Tuohy  Needle gauge: 17 G Needle length: 9 cm Needle insertion depth: 8 cm Catheter type: closed end flexible Catheter size: 19 Gauge Catheter at skin depth: 13 cm Test dose: negative and Other (1% lidocaine)  Assessment Events: blood not aspirated, injection not painful, no injection resistance, no paresthesia and negative IV test  Additional Notes Patient identified. Risks, benefits, and alternatives discussed with patient including but not limited to bleeding, infection, nerve damage, paralysis, failed block, incomplete pain control, headache, blood pressure changes, nausea, vomiting, reactions to medication, itching, and postpartum back pain. Confirmed with bedside nurse the patient's most recent platelet count. Confirmed with patient that they are not currently taking any anticoagulation, have any bleeding history, or any family history of bleeding disorders. Patient expressed understanding and wished to proceed. All questions were answered. Sterile technique was used throughout the entire procedure. Please see nursing notes for vital signs.   Crisp LOR after one needle redirection. Test dose was given through epidural catheter and negative prior to continuing to dose epidural or start infusion. Warning signs of high block given to the patient including shortness of breath, tingling/numbness in  hands, complete motor block, or any concerning symptoms with instructions to call for help. Patient was given instructions on fall risk and not to get out of bed. All questions and concerns addressed with instructions to call with any issues or inadequate analgesia.  Reason for block:procedure for pain

## 2020-03-02 NOTE — H&P (Signed)
Rebecca Webster is a 39 y.o. female presenting for labor augmentation from MAU due to severe range BPs.   39 yo  [id number]. 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  Spontaneous pregnancy after TL reversal. Presented to MAU for contractions since Procardia didn't help, painful UCs q 7 min. No LOF or vag bleeding. +FMs. No HA/ vision changes/ RUQ pain. Gestational HTN in this pregnancy but not on antiHTN meds.  Ob Hx complex- 3 term SVDs (largest 8 lbs), then preterm delivery by C/s for twins at 33 wks, then fetal loss at 20 wks after failed rescue cerclage. One ectopic in b/w.  Prophylactic cerclage in this pregnancy at 13 wks. She was scheduled for IOL at 37 wks due to [facility]. Baby was breech last week but cephalic on sono in MAU today.   AMA, 1st NIPS at 10 wk was low fetal fractions and reported HIGH RISK for T13 or T18, saw MFM and repeat NIPS 2 wks later normal. She was being followed by MFM till 24 wks. Nl anatomy and serial growth sono.  BTMZ at 27.1, 27.2 wks due to UCs.  Vaginal Prometrium for short cervix.   OB History    Gravida  7   Para  4   Term  3   Preterm  1   AB  2   Living  5     SAB  1   TAB      Ectopic  1   Multiple  1   Live Births  5          Past Medical History:  Diagnosis Date  . Anemia    not currently anemic  . Ectopic pregnancy   . Gestational HTN   . Incompetent cervix    Past Surgical History:  Procedure Laterality Date  . abdominalplasty    . CERVICAL CERCLAGE N/A 05/06/2019   Procedure: CERCLAGE CERVICAL;  Surgeon: Maxie Better, MD;  Location: [facility];  Service: Gynecology;  Laterality: N/A;  . CERVICAL CERCLAGE N/A 09/29/2019   Procedure: CERCLAGE CERVICAL;  Surgeon: Maxie Better, MD;  Location: [facility];  Service: Gynecology;  Laterality: N/A;  EDD: 03/28/20  . CESAREAN SECTION    . Reversal of tubal  ligation    . TUBAL LIGATION    . tubalr eversal     Family History: family history includes Cancer in her father; Heart disease in her maternal grandmother; Stroke in her maternal grandmother. Social History:  reports that she has never smoked. She has never used smokeless tobacco. She reports that she does not drink alcohol and does not use drugs.     Maternal Diabetes: No Genetic Screening: Abnormal:  Results: Other: 1st NIPS at 10-11 wk was low fetal fractions, reported HIGH RISK for T13 or T18, pt saw MFM and repeat NIPS 2 wks later normal.  Maternal Ultrasounds/Referrals: Normal with MFM.  Fetal Ultrasounds or other Referrals:  Referred to Materal Fetal Medicine  Maternal Substance Abuse:  No Significant Maternal Medications:  Meds include: Other: vaginal Prometrium. Procardia prn in 3rd trim for contractions  Significant Maternal Lab Results:  Other:  GBS was not performed at last visit since <36 wks Other Comments:   Review of Systems History Dilation: 2 Effacement (%): Thick Exam by:: Dr. Juliene Pina Blood pressure (!) 153/83, pulse 90, temperature 98.5 F (36.9 C), temperature source  Oral, resp. rate 18, height 5\' 11"  (1.803 m), weight (!) 125.6 kg, last menstrual period 06/22/2019, SpO2 99 %, unknown if currently breastfeeding. Exam Physical Exam  Patient Vitals for the past 24 hrs:  BP Temp Temp src Pulse Resp SpO2 Height Weight  03/01/20 2316 (!) 153/83 -- -- 90 -- -- -- --  03/01/20 2308 (!) 158/97 -- -- 89 -- -- -- --  03/01/20 2301 (!) 167/93 -- -- 88 -- -- -- --  03/01/20 2246 (!) 189/107 -- -- 94 -- -- -- --  03/01/20 2231 (!) 179/101 -- -- 93 -- -- -- --  03/01/20 2222 (!) 168/104 -- -- 94 -- -- -- --  03/01/20 2216 (!) 175/97 -- -- 91 -- -- -- --  03/01/20 2211 (!) 144/96 -- -- 93 -- -- -- --  03/01/20 2131 (!) 139/91 98.5 F (36.9 C) Oral 90 18 99 % 5\' 11"  (1.803 m) (!) 125.6 kg  Physical exam:  A&O x 3, no acute distress. Pleasant HEENT neg, no  thyromegaly Lungs CTA bilat CV RRR, S1S2 normal Abdo soft, non tender, non acute Extr no edema/ tenderness Pelvic after cerclage removal 1cm dilated but long cervix, ballotable Vx, unengaged.  FHT  140s + accels no decels mod variability- cat I Toco q 4-5 min   Cerclage removal- done in labor room after verbal consent. Prolene cut and removed, shown to pt.  Prenatal labs: ABO, Rh: --/--/O POS (07/27 2245) Antibody: NEG (07/27 2245) Rubella:  Immune RPR:  NR HBsAg:Neg    HIV:   Neg  GBS: not performed this pregnancy.  Glucola nl NIPS- inadequate fetal fractions and repeat was normal   Assessment/Plan: 40 yo at 36.2 wks, with contractions, GHTN with severe range BPs, Cervical cerclage. Prior C/s x1. Cephalic per MAU sono.   Admit to L&D, Cerclage removal. Labor augmentation wiith low dose pitocin for TOLAC, risks/ complications esp uterine rupture discussed GBS unknown, several exams done, so cant check rapid GBS now. Plan PCN per protocol, pt accepts  PIH labs incl urine P/C ratio nl, no PEC, continue to control BPs with antiHTN protocol, defer magnesium since no neural symptoms and no preeclampsia, will start Procardia 30mg  XL now   Prematurity- NICU at birth. ?? 1st Panorama abn- low fetal fractions and High Risk for T13, or T18 but repeat Panorama 2 weeks later was normal   Pt voiced understanding.   05-01-2003 03/02/2020, 12:01 AM

## 2020-03-02 NOTE — Progress Notes (Signed)
Cerclage removal Cervical cerclage removed in labor room with no complications, Loop was cut and entire stitch removed without problem.  Pt did well   --Rebecca Evans MD

## 2020-03-02 NOTE — Anesthesia Preprocedure Evaluation (Signed)
Anesthesia Evaluation  Patient identified by MRN, date of birth, ID band Patient awake    Reviewed: Allergy & Precautions, Patient's Chart, lab work & pertinent test results  History of Anesthesia Complications Negative for: history of anesthetic complications  Airway Mallampati: II  TM Distance: >3 FB Neck ROM: Full    Dental no notable dental hx.    Pulmonary neg pulmonary ROS,    Pulmonary exam normal        Cardiovascular hypertension (gestational), Normal cardiovascular exam     Neuro/Psych negative neurological ROS  negative psych ROS   GI/Hepatic negative GI ROS, Neg liver ROS,   Endo/Other  negative endocrine ROS  Renal/GU negative Renal ROS  negative genitourinary   Musculoskeletal negative musculoskeletal ROS (+)   Abdominal   Peds  Hematology negative hematology ROS (+)   Anesthesia Other Findings Day of surgery medications reviewed with patient.  Reproductive/Obstetrics (+) Pregnancy (Hx of C/S x1)                             Anesthesia Physical Anesthesia Plan  ASA: III  Anesthesia Plan: Epidural   Post-op Pain Management:    Induction:   PONV Risk Score and Plan: Treatment may vary due to age or medical condition  Airway Management Planned: Natural Airway  Additional Equipment:   Intra-op Plan:   Post-operative Plan:   Informed Consent: I have reviewed the patients History and Physical, chart, labs and discussed the procedure including the risks, benefits and alternatives for the proposed anesthesia with the patient or authorized representative who has indicated his/her understanding and acceptance.       Plan Discussed with:   Anesthesia Plan Comments:         Anesthesia Quick Evaluation

## 2020-03-02 NOTE — Progress Notes (Addendum)
Rebecca Webster is a 39 y.o. [id number] at [redacted]w[redacted]d by LMP c/w 1st trim sono. admitted for labor augmentation due to sever range BPs in MAU when she presented for labor check. Cerclage removed at admission and is on low dose pitocin since then due to one C/section hx then one fetal loss at 20 wks w PPROM. Largest SVD 8'3"   Subjective: Feels some UCs. Planning to avoid epidural, managing well in early labor.   Objective: BP 128/84   Pulse 86   Temp 98.1 F (36.7 C) (Oral)   Resp 20   Ht 5\' 11"  (1.803 m)   Wt (!) 125.6 kg   LMP 06/22/2019   SpO2 100%   BMI 38.62 kg/m  FHT:  FHR: 140-145 bpm, variability: moderate,  accelerations:  Present,  decelerations:  Absent UC:   regular, every 3 minutes SVE: 2/ 50%/ -5/ Vx.BBOW. High station but not ballotable with contraction (better since admission and pitocin) Cervical foley attempted x 2, it expels out as we fill balloon, possibly since cx is now short and head well applied. Plan AROM once more dilated.   Labs: Lab Results  Component Value Date   WBC 7.8 03/01/2020   HGB 12.2 03/01/2020   HCT 39.1 03/01/2020   MCV 77.0 (L) 03/01/2020   PLT 231 03/01/2020    Assessment / Plan: Augmentation of labor, progressing well, early labor. AROM at further dilation.  Pitocin at 10 mu with good pattern. Titrate as needed.FHT cat I  GHTN, no meds since IV meds in MAU. Plan to start Procardia 30mg  PP since expect it to get worse. No PEC, no neural s/s. Labs nl. P/C undetectable  AMA- with one abn NIPS and repeat after 2 wks was normal  Fetal Wellbeing:  Category I Pain Control:  Labor support without medications I/D:  Unknown. Couldnt check rapid GBS due to exam with gel in MAU. PCN per protocol Anticipated MOD:  NSVD if head engages and start descent  TOLAC pt, counseled  03/03/2020 03/02/2020, 8:28 AM

## 2020-03-02 NOTE — Progress Notes (Signed)
Rebecca Webster is a 39 y.o. [id number] at 103w2d by LMP c/w 1st trim sono. admitted for labor augmentation due to sever range BPs in MAU when she presented for labor check. Cerclage removed at admission and is on low dose pitocin since then due to one C/section hx then one fetal loss at 20 wks w PPROM. Largest SVD 8'3"   Subjective: Feels UCs.  Objective: BP (!) 147/96   Pulse 81   Temp 98.8 F (37.1 C) (Oral)   Resp 20   Ht 5\' 11"  (1.803 m)   Wt (!) 125.6 kg   LMP 06/22/2019   SpO2 100%   BMI 38.62 kg/m  FHT:  FHR: 140-145 bpm, variability: moderate,  accelerations:  Present,  decelerations:  Absent UC:   regular, every 3 minutes SVE: Controlled AROM since ballotable head. Clear copious fluid. Head well applied to cx after AROM, still at -2 but Cx at 4/ 50%, well applied to head.   Labs: Lab Results  Component Value Date   WBC 7.8 03/01/2020   HGB 12.2 03/01/2020   HCT 39.1 03/01/2020   MCV 77.0 (L) 03/01/2020   PLT 231 03/01/2020    Assessment / Plan: 36.2 wks with GHTN with severe range BPs, no PEC, needing labor IOL/ AOL, s/p cerclage removal yesterday Augmentation of labor, progressing well, early labor, on Pitocin at 10 mu with good pattern. Titrate as needed. FHT cat I  GHTN, no more meds since IV meds in MAU. Plan to start Procardia 30mg  PP since expect it to get worse. No PEC, no neural s/s. Labs nl. P/C undetectable  AMA- with one abn NIPS and repeat after 2 wks was normal  Fetal Wellbeing:  Category I Pain Control:  Labor support without medications I/D:  Unknown. Couldnt check rapid GBS due to exam with gel in MAU. PCN per protocol Anticipated MOD:  NSVD if head engages and start descent  TOLAC pt, counseled   03/03/2020 03/02/2020, 1:04 PM

## 2020-03-02 NOTE — Progress Notes (Signed)
S: Doing well, no complaints, pain poorly controlled and now desiring epidural, was using nitrous oxide but not helping.  O: BP (!) 160/88   Pulse 92   Temp 97.9 F (36.6 C) (Oral)   Resp 20   Ht 5\' 11"  (1.803 m)   Wt (!) 125.6 kg   LMP 06/22/2019   SpO2 100%   BMI 38.62 kg/m   Vitals:   03/02/20 1343 03/02/20 1415 03/02/20 1439 03/02/20 1446  BP: (!) 145/91 (!) 142/89 (!) 167/85 (!) 160/88  Pulse: 98 100 100 92  Resp: 20 20 20 20   Temp: 97.9 F (36.6 C)     TempSrc: Oral     SpO2:      Weight:      Height:         FHT:  FHR: 130s bpm, variability: moderate,  accelerations:  Present,  decelerations:  Present rare variables until IV labetalol pushed for bp 167/85, now decels with last 5 contractions (unable to time as toco not picking up), good variability within UC:   regular, every 2-3 minutes SVE:   Dilation: 4 Effacement (%): 80 Station: 0 Exam by:: , RN   A / P:  39 y.o.  OB History  Gravida Para Term Preterm AB Living  7 4 3 1 2 5   SAB TAB Ectopic Multiple Live Births  1 0 1 1 5    at [redacted]w[redacted]d IOL due to gest htn with severe range bps in MAU. No evidence PEC. pitocin/ AROM. slow progress, but starting to have better fetal descent, expect epidural/ relaxation to help acheive better cervical dilation  Gest htn. No evidence PEC, recommend checking bps between contractions as recent severe range bp may have been due to pain. Treat pain.  TOLAC  Fetal Wellbeing:  Category II Current decels likely due to hypotension from IV labetalol, should improve with fluid bolus.  Pain Control:  give IV pain meds now as there will be a delay for epidural, needs IV bolus and repeat CBC  Anticipated MOD:  workign towards SVD though VBAC pt and prior SVDs were 20 yrs ago  20 03/02/2020, 3:07 PM

## 2020-03-02 NOTE — Progress Notes (Signed)
S: Doing well, no complaints, pain finally controlled with second epidural  O: BP (!) 112/64   Pulse 93   Temp 98.7 F (37.1 C) (Axillary)   Resp 18   Ht 5\' 11"  (1.803 m)   Wt (!) 125.6 kg   LMP 06/22/2019   SpO2 99%   BMI 38.62 kg/m    FHT:  FHR: 130s bpm, variability: moderate,  accelerations:  Present,  decelerations:  Present variables with contractions UC:   regular, every 2-3 minutes SVE:   Dilation: 7 Effacement (%): 80 Station: 0 Exam by:: dr [email or web address]   A / P:  39 y.o.  OB History  Gravida Para Term Preterm AB Living  7 4 3 1 2 5   SAB TAB Ectopic Multiple Live Births  1 0 1 1 5    at [redacted]w[redacted]d IOL, worsening gest htn with severe range bps, cerclage removed, adequate labor process, fetal head still high  Fetal Wellbeing:  Category II IUPC placed to better eval strength of ctx and timing of decels.   TOLAC  Pain Control:  Epidural  Anticipated MOD:  working towards SVD  03/02/2020, 5:25 PM

## 2020-03-02 NOTE — Anesthesia Procedure Notes (Signed)
Epidural Patient location during procedure: OB Start time: 03/02/2020 4:40 PM End time: 03/02/2020 4:43 PM  Staffing Anesthesiologist: Kaylyn Layer, MD Performed: anesthesiologist   Preanesthetic Checklist Completed: patient identified, IV checked, risks and benefits discussed, monitors and equipment checked, pre-op evaluation and timeout performed  Epidural Patient position: sitting Prep: DuraPrep and site prepped and draped Patient monitoring: heart rate, continuous pulse ox and blood pressure Approach: midline Location: L2-L3 Injection technique: LOR air  Needle:  Needle type: Tuohy  Needle gauge: 17 G Needle length: 9 cm Needle insertion depth: 7 cm Catheter type: closed end flexible Catheter size: 19 Gauge Catheter at skin depth: 12 cm  Assessment Events: blood not aspirated, injection not painful, no injection resistance, no paresthesia and negative IV test  Additional Notes Patient identified. Risks, benefits, and alternatives discussed with patient including but not limited to bleeding, infection, nerve damage, paralysis, failed block, incomplete pain control, headache, blood pressure changes, nausea, vomiting, reactions to medication, itching, and postpartum back pain. Confirmed with bedside nurse the patient's most recent platelet count. Confirmed with patient that they are not currently taking any anticoagulation, have any bleeding history, or any family history of bleeding disorders. Patient expressed understanding and wished to proceed. All questions were answered. Sterile technique was used throughout the entire procedure. Please see nursing notes for vital signs.   Previous epidural catheter removed with tip intact. Crisp LOR with Tuohy needle after one needle redirection at L2-3 level. Whitacre 25g spinal needle introduced through Tuohy with clear CSF return prior to injection of intrathecal medication. Spinal needle withdrawn and epidural catheter threaded  easily. Negative aspiration of catheter for heme or CSF prior to starting epidural infusion. Warning signs of high block given to the patient including shortness of breath, tingling/numbness in hands, complete motor block, or any concerning symptoms with instructions to call for help. Patient was given instructions on fall risk and not to get out of bed. All questions and concerns addressed with instructions to call with any issues or inadequate analgesia.  Patient comfortable with contractions prior to my leaving room. Reason for block:procedure for pain

## 2020-03-03 DIAGNOSIS — O139 Gestational [pregnancy-induced] hypertension without significant proteinuria, unspecified trimester: Secondary | ICD-10-CM

## 2020-03-03 NOTE — Progress Notes (Signed)
PPD # 1 S/P VBAC  Live born female  Birth Weight: 6 lb 9.6 oz (2994 g) APGAR: 9, 9  Newborn Delivery   Birth date/time: 03/02/2020 18:43:00 Delivery type: VBAC, Spontaneous     Baby name: Rebecca Webster Delivering provider: Noland Fordyce  Episiotomy:None   Lacerations:1st degree   Feeding: breast  Pain control at delivery: Spinal   S:  Reports feeling good. No issues.              Tolerating po/ No nausea or vomiting             Bleeding is moderate             Pain controlled with ibuprofen (OTC)             Up ad lib / ambulatory / voiding without difficulties   O:  A & O x 3, in no apparent distress              VS:  Vitals:   03/02/20 2200 03/02/20 2300 03/03/20 0310 03/03/20 0718  BP: (!) 134/86 (!) 132/88 (!) 135/88 124/69  Pulse: 99 98 89 59  Resp: 18 17 18 18   Temp: 98.1 F (36.7 C) 98 F (36.7 C) 98.1 F (36.7 C) 98.1 F (36.7 C)  TempSrc: Oral Oral Oral   SpO2: 99% 99% 99% 99%  Weight:      Height:        LABS:  Recent Labs    03/01/20 2254 03/02/20 1513  WBC 7.8 7.2  HGB 12.2 11.8*  HCT 39.1 37.8  PLT 231 217    Blood type: --/--/O POS (07/27 2245)  Rubella:     I&O: I/O last 3 completed shifts: In: -  Out: 750 [Urine:500; Blood:250]          No intake/output data recorded.  Vaccines: TDaP Declined         Flu    Declined   Gen: AAO x 3, NAD  Abdomen: soft, non-tender, non-distended             Fundus: firm, non-tender, U-1  Perineum: repair intact, no edema  Lochia: moderate  Extremities: 1+ non-pitting edema, no calf pain or tenderness   A/P: PPD # 1 39 y.o., 20   Principal Problem:   Postpartum care following vaginal delivery 7/28 Active Problems:   Preterm labor   SVD (spontaneous vaginal delivery) 7/28   Gestational hypertension   Doing well - stable status             BPs stable after delivery and no symptoms of preeclampsia, no medication indicated at this time  Routine post partum orders  Anticipate discharge tomorrow     8/28, MSN, CNM 03/03/2020, 11:07 AM

## 2020-03-03 NOTE — Lactation Note (Signed)
This note was copied from a baby's chart. Lactation Consultation Note  Patient Name: Rebecca Webster [id number] Date: 03/03/2020 Reason for consult: Initial assessment;Early term 37-38.6wks P5, 5 hour female LPTI . Infant 2 voids since delivery. Per mom, she did not BF her 1st child, she BF 2nd for 2 weeks and twins for 3 weeks due her retuning to work. Per mom, her plans is to BF longer with her 5th child.  Per mom, infant has been latching well, most feeding 10 minutes in length. Infant only BF for 3 minutes and became sleepy had blood work done ( hill stick) mom hand expressed and infant had 5 mls of EBM by spoon and mom was doing STS with infant as LC left the room . LC discussed LPTI policy with parents, mom knows to BF by cues, 8 to 12+ times within 24 hours, limit feedings 30 minutes or less and do as much STS with infant as possible, green sheet given. LC discussed possible supplementation EBM and donor milk,  mom will think about donor milk if she would need to supplement infant. Mom's current choice is to latch infant at breast and give infant her EBM that is hand expressed after latching infant at breast for each feeding.  Mom will use DEBP every 3 hours for 15 minutes on initial setting to help establish milk supply, and give infant any EBM. Mom knows to call RN or LC if she needs assistance with latching infant at breast. Mom made aware of O/P services, breastfeeding support groups, community resources, and our phone # for post-discharge questions.  Maternal Data Formula Feeding for Exclusion: No Has patient been taught Hand Expression?: Yes Does the patient have breastfeeding experience prior to this delivery?: Yes  Feeding Feeding Type: Breast Fed  LATCH Score Latch: Too sleepy or reluctant, no latch achieved, no sucking elicited.  Audible Swallowing: A few with stimulation  Type of Nipple: Everted at rest and after stimulation  Comfort (Breast/Nipple): Soft /  non-tender  Hold (Positioning): Assistance needed to correctly position infant at breast and maintain latch.  LATCH Score: 6  Interventions Interventions: Breast feeding basics reviewed;Assisted with latch;Breast compression;Skin to skin;Adjust position;Breast massage;Support pillows;Hand express;Position options;Expressed milk;DEBP  Lactation Tools Discussed/Used WIC Program: No Pump Review: Setup, frequency, and cleaning;Milk Storage (Per mom, she has DEBP) Initiated by:: Danelle Earthly, IBCLC Date initiated:: 03/03/20   Consult Status Consult Status: Follow-up Date: 03/04/20 Follow-up type: In-patient    Danelle Earthly 03/03/2020, 12:30 AM

## 2020-03-03 NOTE — Anesthesia Postprocedure Evaluation (Signed)
Anesthesia Post Note  Patient: Rebecca Webster  Procedure(s) Performed: AN AD HOC LABOR EPIDURAL     Patient location during evaluation: Mother Baby Anesthesia Type: Epidural Level of consciousness: awake, awake and alert and oriented Pain management: pain level controlled Vital Signs Assessment: post-procedure vital signs reviewed and stable Respiratory status: spontaneous breathing, nonlabored ventilation and respiratory function stable Cardiovascular status: stable Postop Assessment: no headache, patient able to bend at knees, no apparent nausea or vomiting, adequate PO intake and able to ambulate Anesthetic complications: no   No complications documented.  Last Vitals:  Vitals:   03/03/20 0310 03/03/20 0718  BP: (!) 135/88 124/69  Pulse: 89 59  Resp: 18 18  Temp: 36.7 C 36.7 C  SpO2: 99% 99%    Last Pain:  Vitals:   03/03/20 0718  TempSrc:   PainSc: 0-No pain   Pain Goal:                   Fabio Wah

## 2020-03-04 MED ORDER — IBUPROFEN 600 MG PO TABS: 600.0000 mg | ORAL_TABLET | Freq: Four times a day (QID) | ORAL | 0 refills | Status: DC

## 2020-03-04 NOTE — Lactation Note (Signed)
This note was copied from a baby's chart. Lactation Consultation Note  Patient Name: Rebecca Webster [id number] Date: 03/04/2020   Infant is a LPTI 39 hours old. Infant has 6 % weight loss. Mom has been breast and bottle feeding with formula. Mom states nipples are feeling better since she has been able to get a deeper latch. Mom also states she has been hand expressing to entice the baby prior to latching. Mom is pumping after each feed and plans to supplement with breast milk and decrease formula as volume comes in. Instructed Mom on feeding volume guidelines for the LPTI. We went over the storage of formula vs breast milk.  Mom has an appointment with Pediatrician on Sunday. Instructed Mom to watch for adequate output daily which should increase over the next few days. Mom has an electric pump at home.   Plan 1. To nurse infant with feeding cues and keep infant STS.            2. To follow up with pumping after each feed, and supplement as per guidelines for the LPTI with either formula or breastmilk            3. F/U with outpatient lactation ( message sent to outpatient clinic to contact parents for an appointment)             4. Parents will call for assistance PRN. Mom is aware of outpatient lactation support.                        Toddy Boyd  Nicholson-Springer 03/04/2020, 10:32 AM

## 2020-03-04 NOTE — Progress Notes (Signed)
No c/o; minimal pain; nml lochia Voids w/o difficulty; breastfeeding  Patient Vitals for the past 24 hrs:  BP Temp Temp src Pulse Resp SpO2  03/04/20 0604 122/71 97.6 F (36.4 C) Oral 76 18 100 %  03/03/20 2045 (!) 116/64 98.3 F (36.8 C) Oral 99 18 100 %  03/03/20 1457 127/75 98.7 F (37.1 C) Oral 100 18 98 %  03/03/20 1230 114/72 98.6 F (37 C) -- 70 18 99 %   A&ox3 nml respirations Abd: soft,nt, nd; fundus firm and below umb LE: no edema, nt bilat  CBC Latest Ref Rng & Units 03/02/2020 03/01/2020 09/29/2019  WBC 4.0 - 10.5 K/uL 7.2 7.8 5.6  Hemoglobin 12.0 - 15.0 g/dL 11.8(L) 12.2 12.1  Hematocrit 36 - 46 % 37.8 39.1 37.9  Platelets 150 - 400 K/uL 217 231 206   A/P: ppd2 s/p vbac 1. Doing well, d/c home today and f/u in 6 wks 2. Mild acute anemia - iron with pnv and iron rich foods 3. ghtn - bps wnl, plan 1 wk f/u 4. RH pos 5. RI

## 2020-03-04 NOTE — Discharge Summary (Signed)
Postpartum Discharge Summary  Date of Service updated     Patient Name: Rebecca Webster DOB: 1981/01/13 MRN: [id number]  Date of admission: 03/01/2020 Delivery date:03/02/2020  Delivering provider: Noland Fordyce  Date of discharge: 03/04/2020  Admitting diagnosis: Preterm labor [O60.00] Intrauterine pregnancy: [redacted]w[redacted]d     Secondary diagnosis:  Principal Problem:   Postpartum care following vaginal delivery 7/28 Active Problems:   Preterm labor   SVD (spontaneous vaginal delivery) 7/28   Gestational hypertension  Additional problems: none    Discharge diagnosis: VBAC                                              Post partum procedures:none Augmentation: N/A Complications: None  Hospital course: Onset of Labor With Vaginal Delivery      39 y.o. yo [id number] at [redacted]w[redacted]d was admitted in Active Labor on 03/01/2020. Patient had an uncomplicated labor course as follows:  Membrane Rupture Time/Date: 11:41 AM ,03/02/2020   Delivery Method:VBAC, Spontaneous  Episiotomy: None  Lacerations:  1st degree  Patient had an uncomplicated postpartum course.  She is ambulating, tolerating a regular diet, passing flatus, and urinating well. Patient is discharged home in stable condition on 03/04/20.  Newborn Data: Birth date:03/02/2020  Birth time:6:43 PM  Gender:Female  Living status:Living  Apgars:9 ,9  Weight:2994 g     Physical exam  Vitals:   03/03/20 1230 03/03/20 1457 03/03/20 2045 03/04/20 0604  BP: 114/72 127/75 (!) 116/64 122/71  Pulse: 70 100 99 76  Resp: 18 18 18 18   Temp: 98.6 F (37 C) 98.7 F (37.1 C) 98.3 F (36.8 C) 97.6 F (36.4 C)  TempSrc:  Oral Oral Oral  SpO2: 99% 98% 100% 100%  Weight:      Height:      Labs: Lab Results  Component Value Date   WBC 7.2 03/02/2020   HGB 11.8 (L) 03/02/2020   HCT 37.8 03/02/2020   MCV 75.9 (L) 03/02/2020   PLT 217 03/02/2020   CMP Latest Ref Rng & Units 03/01/2020  Glucose 70 - 99 mg/dL 88  BUN 6 - 20 mg/dL 6   Creatinine 03/03/2020 - 3.54 mg/dL 5.62  Sodium 5.63 - 893 mmol/L 138  Potassium 3.5 - 5.1 mmol/L 3.6  Chloride 98 - 111 mmol/L 106  CO2 22 - 32 mmol/L 19(L)  Calcium 8.9 - 10.3 mg/dL 9.8  Total Protein 6.5 - 8.1 g/dL 6.5  Total Bilirubin 0.3 - 1.2 mg/dL 0.4  Alkaline Phos 38 - 126 U/L 119  AST 15 - 41 U/L 17  ALT 0 - 44 U/L 14   Edinburgh Score: Edinburgh Postnatal Depression Scale Screening Tool 03/03/2020  I have been able to laugh and see the funny side of things. 0  I have looked forward with enjoyment to things. 0  I have blamed myself unnecessarily when things went wrong. 0  I have been anxious or worried for no good reason. 0  I have felt scared or panicky for no good reason. 0  Things have been getting on top of me. 0  I have been so unhappy that I have had difficulty sleeping. 0  I have felt sad or miserable. 0  I have been so unhappy that I have been crying. 0  The thought of harming myself has occurred to me. 0  Edinburgh Postnatal Depression Scale Total 0  After visit meds:     Discharge home in stable condition Infant Feeding: Breast Infant Disposition:home with mother Discharge instruction: per After Visit Summary and Postpartum booklet. Activity: Advance as tolerated. Pelvic rest for 6 weeks.  Diet: routine diet Anticipated Birth Control: Unsure Postpartum Appointment:6 weeks Additional Postpartum F/U: n/a Future Appointments:No future appointments. Follow up Visit:  Follow-up Information    Shea Evans, MD Follow up.   Specialty: Obstetrics and Gynecology Contact information: [address] [place] [address] [phone]                   03/04/2020 Vick Frees, MD

## 2020-03-04 NOTE — Lactation Note (Signed)
This note was copied from a baby's chart. Lactation Consultation Note  Patient Name: Rebecca Webster Today's Date: 03/04/2020  P5, 31 hour LPTI-2% weight loss. LC reinforced mom to continue using DEBP every 3 hours for 15 minutes on initial setting and normal not see colostrum at first with pumping due to thickness and to hand express after pumping. Infant is latching well per parents, but mom decided supplement infant due to cluster feeding and she was tired. RN explained LEAD to parents, mom did two feedings of formula only but moving forward she will latch infant at every feeding first to help establish her milk supply and then supplement with formula.  LC did not see latch mom finished infant for 20 minutes and was supplementing infant with formula as LC walked in room. LC reviewed LPTI policy again with parents another green sheet given.     Maternal Data    Feeding Feeding Type: Bottle Fed - Formula Nipple Type: Slow - flow  LATCH Score                   Interventions    Lactation Tools Discussed/Used     Consult Status      Danelle Earthly 03/04/2020, 2:03 AM

## 2020-03-28 ENCOUNTER — Inpatient Hospital Stay (HOSPITAL_COMMUNITY): Admit: 2020-03-28 | Payer: Self-pay

## 2020-05-17 IMAGING — US US MFM OB FOLLOW-UP
1 series · 13 of 28 positions shown · non-contrast
Comparison: none

[Series 1: us mfm ob follow-up · 13 of 85 slices shown]
[im 4/85]
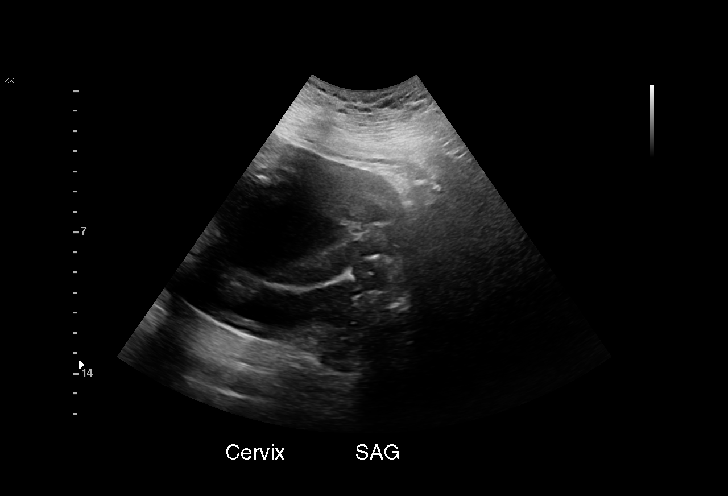
[im 10/85]
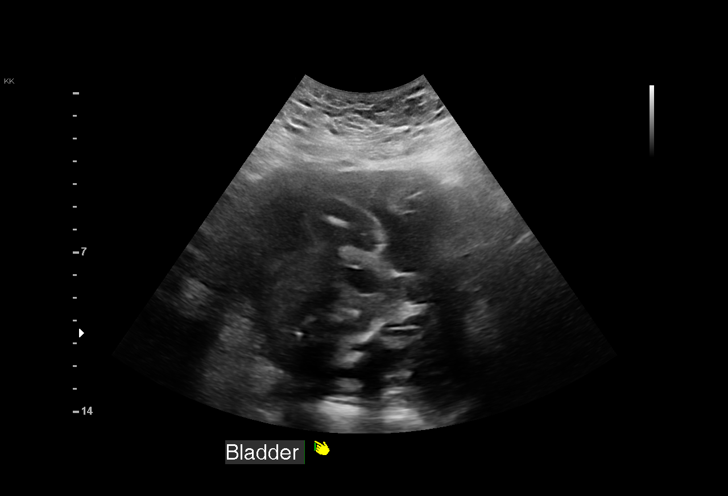
[im 16/85]
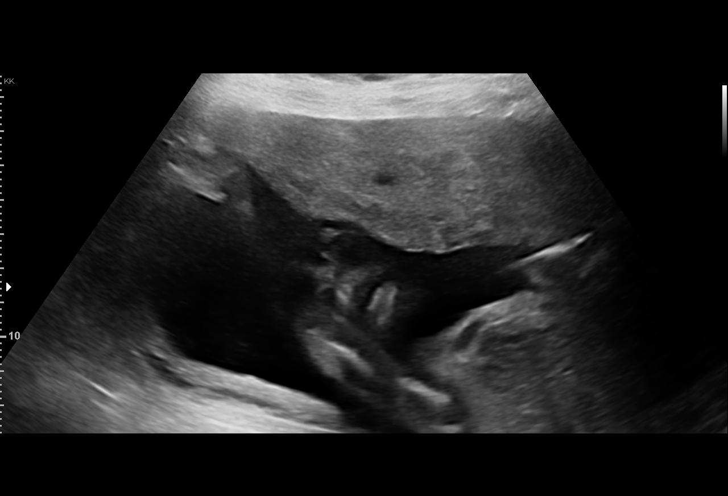
[im 22/85]
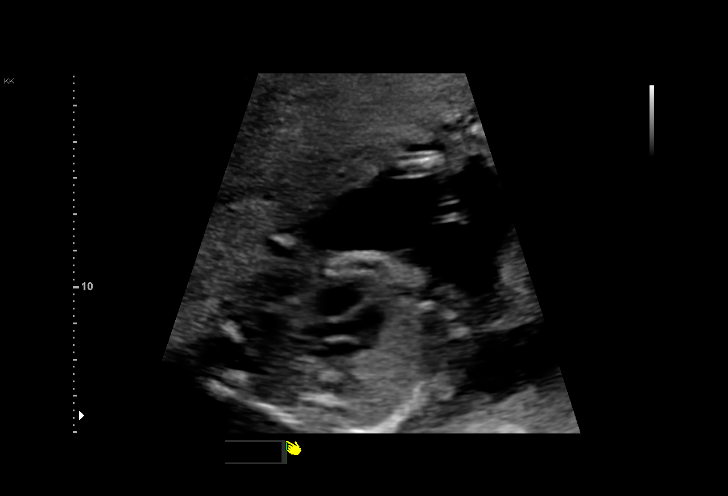
[im 29/85]
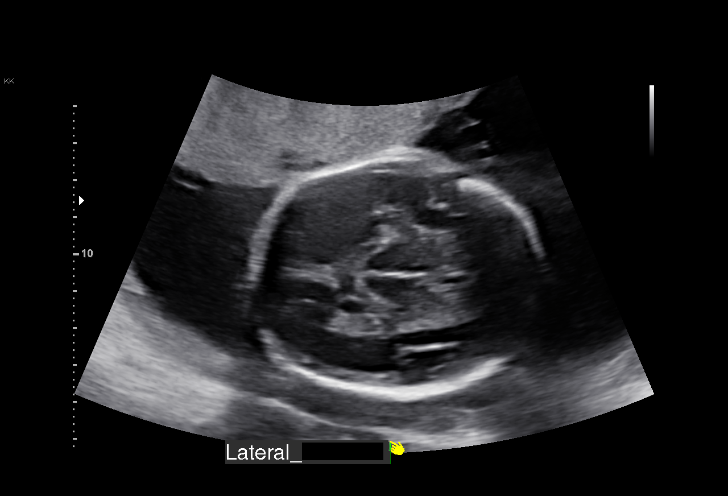
[im 35/85]
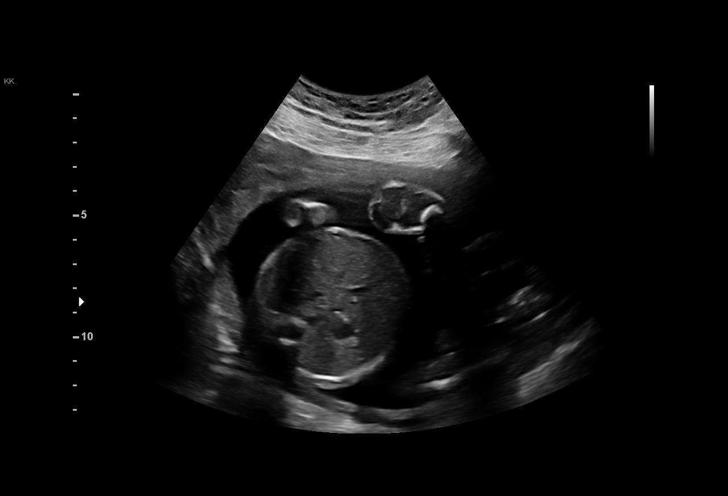
[im 44/85]
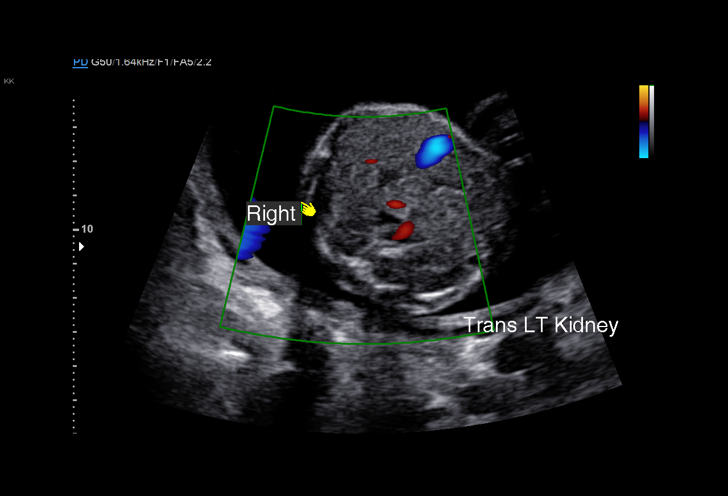
[im 50/85]
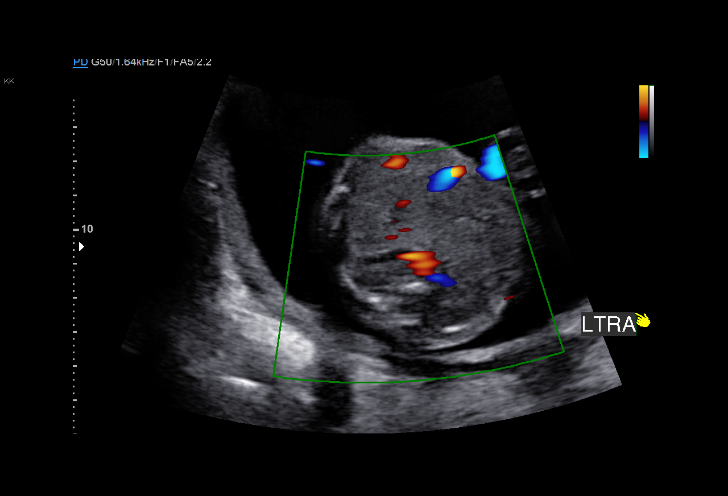
[im 57/85]
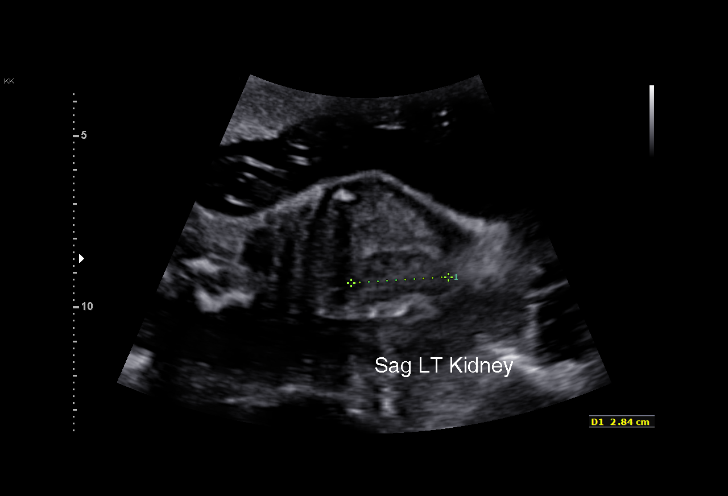
[im 63/85]
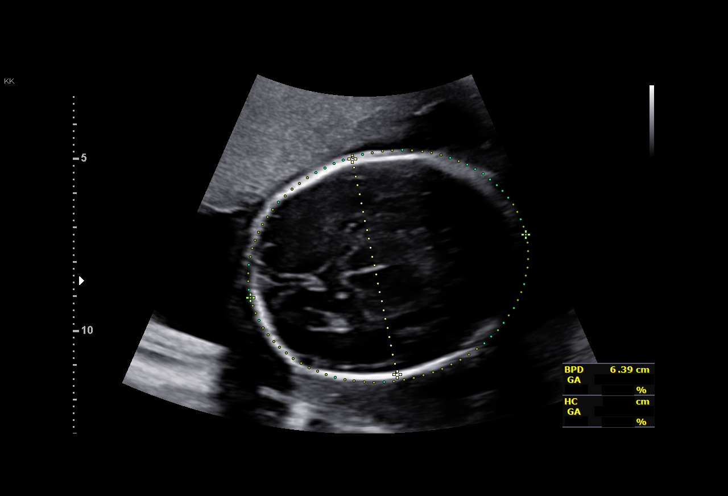
[im 69/85]
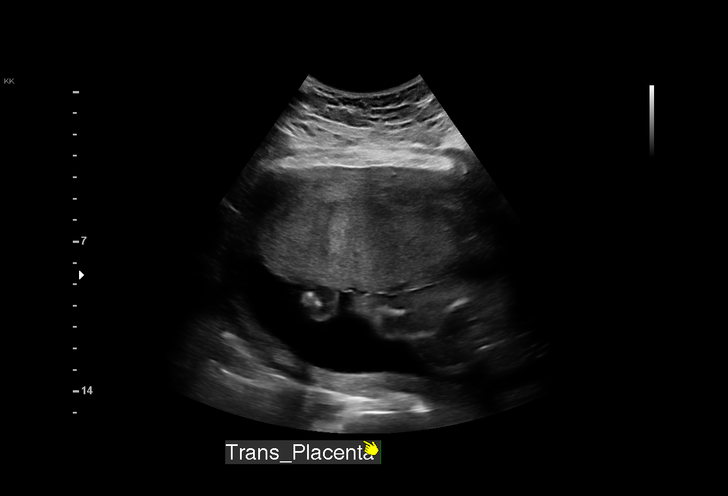
[im 75/85]
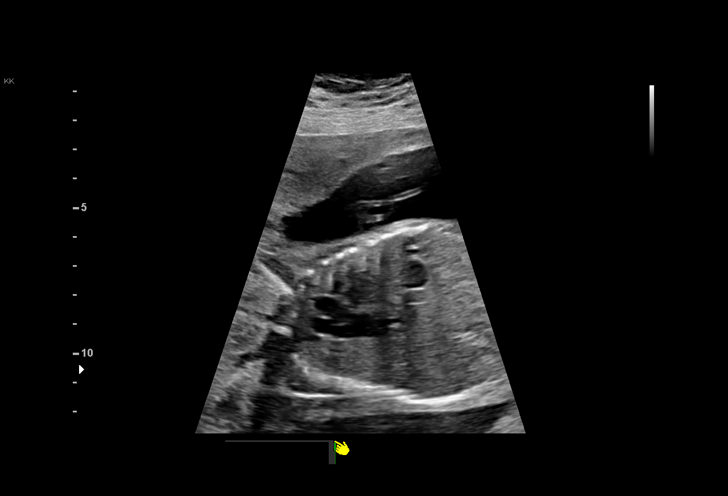
[im 81/85]
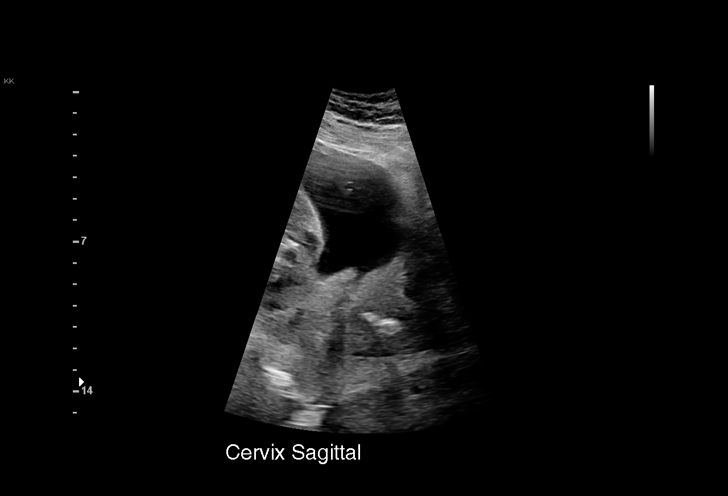

[13 of 28 positions shown; findings below may reference images not displayed]

[facility]                                        [facility]

Indications

 Poor obstetric history: Previous midtrimester
 loss (add cerv insuff if applicable) (20w)
 Advanced maternal age multigravida 35+,
 second trimester (38)
 Previous cesarean delivery, antepartum
 Cervical cerclage suture present, second
 trimester
 Abnormal biochemical screen (panorama, low
 FF- 2.7% 2nd test low risk NIPS)
 Obesity complicating pregnancy, second
 trimester
 Antenatal follow-up for nonvisualized fetal
 anatomy
 24 weeks gestation of pregnancy
Fetal Evaluation

 Num Of Fetuses:          1
 Fetal Heart Rate(bpm):   155
 Cardiac Activity:        Observed
 Presentation:            Breech
 Placenta:                Fundal
 P. Cord Insertion:       Previously Visualized

 Amniotic Fluid
 AFI FV:      Within normal limits

                             Largest Pocket(cm)

Biometry

 BPD:      63.7   mm     G. Age:  25w 5d         83  %    CI:         75.03  %    70 - 86
                                                          FL/HC:       18.6  %    18.7 -
 HC:      233.3   mm     G. Age:  25w 3d         60  %    HC/AC:       1.10       1.04 -
 AC:      212.7   mm     G. Age:  25w 5d         78  %    FL/BPD:      68.3  %    71 - 87
 FL:       43.5   mm     G. Age:  24w 2d         27  %    FL/AC:       20.5  %    20 - 24

 Est. FW:     782   gm    1 lb 12 oz      70  %
OB History

 Gravidity:     7         Term:  3          Prem:  1        SAB:   1
 Ectopic:       1
Gestational Age

 LMP:            24w 4d       Date:  06/22/19                   EDD:  03/28/20
 U/S Today:      25w 2d                                         EDD:  03/23/20
 Best:           24w 4d    Det. By:  LMP  (06/22/19)            EDD:  03/28/20
Anatomy

 Cranium:                Appears normal         Aortic Arch:            Previously seen
 Cavum:                  Previously seen        Ductal Arch:            Appears normal
 Ventricles:             Appears normal         Diaphragm:              Appears normal
 Choroid Plexus:         Previously seen        Stomach:                Appears normal, left
                                                                        sided
 Cerebellum:             Previously seen        Abdomen:                Appears normal
 Posterior Fossa:        Appears normal         Abdominal Wall:         Previously seen
 Nuchal Fold:            Appears normal         Cord Vessels:           Previously seen
 Face:                   Orbits and profile     Kidneys:                Appear normal
                         previously seen
 Lips:                   Appears normal         Bladder:                Appears normal
 Thoracic:               Appears normal         Spine:                  Limited views
                                                                        appear normal prev
 Heart:                  Appears normal         Upper Extremities:      Previously seen
                         (4CH, axis, and situs)
 RVOT:                   Appears normal         Lower Extremities:      Previously seen
 LVOT:                   Appears normal

 Other:   Female gender previously seen. Heels previously visualized. Technically
          difficult due to maternal habitus and fetal position.
Cervix Uterus Adnexa

 Cervix
 Not visualized (advanced GA >76wks)
Impression

 Follow up growth with prior limited views of the fetal spine.
 Normal interval growth with normal fluid and movement
 Cerclage observed today.
 Suboptimal views of the fetal spine again seen.
Recommendations

 Follow up as clinical indicated.

## 2021-02-28 ENCOUNTER — Other Ambulatory Visit: Payer: Self-pay | Admitting: Obstetrics & Gynecology

## 2021-03-01 ENCOUNTER — Encounter (HOSPITAL_COMMUNITY): Payer: Self-pay | Admitting: *Deleted

## 2021-03-01 ENCOUNTER — Telehealth (HOSPITAL_COMMUNITY): Payer: Self-pay | Admitting: *Deleted

## 2021-03-01 NOTE — Telephone Encounter (Signed)
Preadmission screen  

## 2021-03-05 ENCOUNTER — Inpatient Hospital Stay (HOSPITAL_COMMUNITY)
Admission: AD | Admit: 2021-03-05 | Discharge: 2021-03-05 | Disposition: A | Discharge status: Home / Self Care | Payer: 59 | Attending: Obstetrics | Admitting: Obstetrics

## 2021-03-05 ENCOUNTER — Encounter (HOSPITAL_COMMUNITY): Payer: Self-pay | Admitting: Obstetrics

## 2021-03-05 ENCOUNTER — Other Ambulatory Visit: Payer: Self-pay

## 2021-03-05 DIAGNOSIS — O3431 Maternal care for cervical incompetence, first trimester: Secondary | ICD-10-CM | POA: Diagnosis not present

## 2021-03-05 DIAGNOSIS — N883 Incompetence of cervix uteri: Secondary | ICD-10-CM

## 2021-03-05 DIAGNOSIS — Z3A12 12 weeks gestation of pregnancy: Secondary | ICD-10-CM | POA: Diagnosis not present

## 2021-03-05 DIAGNOSIS — O09211 Supervision of pregnancy with history of pre-term labor, first trimester: Secondary | ICD-10-CM | POA: Diagnosis not present

## 2021-03-05 DIAGNOSIS — O039 Complete or unspecified spontaneous abortion without complication: Secondary | ICD-10-CM | POA: Diagnosis not present

## 2021-03-05 DIAGNOSIS — O209 Hemorrhage in early pregnancy, unspecified: Secondary | ICD-10-CM | POA: Diagnosis present

## 2021-03-05 LAB — CBC WITH DIFFERENTIAL/PLATELET
Abs Immature Granulocytes: 0.02 10*3/uL (ref 0.00–0.07)
Basophils Absolute: 0 10*3/uL (ref 0.0–0.1)
Basophils Relative: 1 %
Eosinophils Absolute: 0.1 10*3/uL (ref 0.0–0.5)
Eosinophils Relative: 1 %
HCT: 39.2 % (ref 36.0–46.0)
Hemoglobin: 12.3 g/dL (ref 12.0–15.0)
Immature Granulocytes: 0 %
Lymphocytes Relative: 30 %
Lymphs Abs: 1.5 10*3/uL (ref 0.7–4.0)
MCH: 22.9 pg — ABNORMAL LOW (ref 26.0–34.0)
MCHC: 31.4 g/dL (ref 30.0–36.0)
MCV: 73 fL — ABNORMAL LOW (ref 80.0–100.0)
Monocytes Absolute: 0.4 10*3/uL (ref 0.1–1.0)
Monocytes Relative: 9 %
Neutro Abs: 3 10*3/uL (ref 1.7–7.7)
Neutrophils Relative %: 59 %
Platelets: 222 10*3/uL (ref 150–400)
RBC: 5.37 MIL/uL — ABNORMAL HIGH (ref 3.87–5.11)
RDW: 16.1 % — ABNORMAL HIGH (ref 11.5–15.5)
WBC: 5 10*3/uL (ref 4.0–10.5)
nRBC: 0 % (ref 0.0–0.2)

## 2021-03-05 LAB — TYPE AND SCREEN
ABO/RH(D): O POS
Antibody Screen: NEGATIVE

## 2021-03-05 LAB — COMPREHENSIVE METABOLIC PANEL
ALT: 17 U/L (ref 0–44)
AST: 16 U/L (ref 15–41)
Albumin: 3.5 g/dL (ref 3.5–5.0)
Alkaline Phosphatase: 77 U/L (ref 38–126)
Anion gap: 8 (ref 5–15)
BUN: 10 mg/dL (ref 6–20)
CO2: 22 mmol/L (ref 22–32)
Calcium: 9.2 mg/dL (ref 8.9–10.3)
Chloride: 108 mmol/L (ref 98–111)
Creatinine, Ser: 0.58 mg/dL (ref 0.44–1.00)
GFR, Estimated: >60 mL/min (ref >60)
Glucose, Bld: 103 mg/dL — ABNORMAL HIGH (ref 70–99)
Potassium: 3.8 mmol/L (ref 3.5–5.1)
Sodium: 138 mmol/L (ref 135–145)
Total Bilirubin: 0.5 mg/dL (ref 0.3–1.2)
Total Protein: 6.6 g/dL (ref 6.5–8.1)

## 2021-03-05 MED ORDER — IBUPROFEN 800 MG PO TABS
800.0000 mg | ORAL_TABLET | Freq: Three times a day (TID) | ORAL | 0 refills | Status: DC
Start: 2021-03-05 — End: 2022-04-19

## 2021-03-05 MED ORDER — LACTATED RINGERS IV BOLUS
1000.0000 mL | Freq: Once | INTRAVENOUS | Status: AC
  Administered 2021-03-05: 1000 mL via INTRAVENOUS

## 2021-03-05 NOTE — MAU Note (Signed)
Pt reports to mau with c/o heavy bleeding that started about 1 hour ago.  Denies pain

## 2021-03-05 NOTE — MAU Provider Note (Signed)
History     CSN: [id number]  Arrival date and time: 03/05/21 0909   Event Date/Time   First Provider Initiated Contact with Patient 03/05/21 [phone]      Chief Complaint  Patient presents with   Vaginal Bleeding   HPI Rebecca Webster is a 40 y.o. [id number] at 42w2dwho presents with vaginal bleeding and membranes protruding from the vagina. She states around 0830 she all of a sudden had bright red bleeding and felt like she was delivering the baby. She came immediately to the hospital. She denies any pain. She reports feeling like she is still bleeding.   She states she has a hx of preterm labor with one of her pregnancies which caused her to have a cerclage placed in her last pregnancy. She was scheduled for a [facility] cerclage on 8/9.   OB History     Gravida  8   Para  4   Term  3   Preterm  1   AB  2   Living  5      SAB  1   IAB      Ectopic  1   Multiple  1   Live Births  5           Past Medical History:  Diagnosis Date   Anemia    not currently anemic   Ectopic pregnancy    Gestational HTN    Incompetent cervix     Past Surgical History:  Procedure Laterality Date   abdominalplasty     CERVICAL CERCLAGE N/A 05/06/2019   Procedure: CERCLAGE CERVICAL;  Surgeon: CServando Salina MD;  Location: [facility];  Service: Gynecology;  Laterality: N/A;   CERVICAL CERCLAGE N/A 09/29/2019   Procedure: CERCLAGE CERVICAL;  Surgeon: CServando Salina MD;  Location: [facility];  Service: Gynecology;  Laterality: N/A;  EDD: 03/28/20   CESAREAN SECTION     Reversal of tubal ligation     TUBAL LIGATION     tubalr eversal      Family History  Problem Relation Age of Onset   Heart disease Maternal Grandmother    Stroke Maternal Grandmother    Cancer Father     Social History   Tobacco Use   Smoking status: Never   Smokeless tobacco: Never  Vaping Use   Vaping Use: Never used  Substance Use Topics   Alcohol use: No   Drug use: No     Allergies: No Known Allergies  Medications Prior to Admission  Medication Sig Dispense Refill Last Dose   alum & mag hydroxide-simeth (MAALOX/MYLANTA) 200-200-20 MG/5ML suspension Take 30 mLs by mouth every 6 (six) hours as needed for indigestion or heartburn.      calcium carbonate (TUMS - DOSED IN MG ELEMENTAL CALCIUM) 500 MG chewable tablet Chew 2 tablets by mouth 3 (three) times daily as needed for indigestion or heartburn.      ibuprofen (ADVIL) 600 MG tablet Take 1 tablet (600 mg total) by mouth every 6 (six) hours. 30 tablet 0    Prenatal Vit-Fe Fumarate-FA (PRENATAL MULTIVITAMIN) TABS tablet Take 1 tablet by mouth daily at 12 noon.       Review of Systems  Constitutional: Negative.  Negative for fatigue and fever.  HENT: Negative.    Respiratory: Negative.  Negative for shortness of breath.   Cardiovascular: Negative.  Negative for chest pain.  Gastrointestinal: Negative.  Negative for abdominal pain, constipation, diarrhea, nausea and vomiting.  Genitourinary:  Positive for vaginal  bleeding. Negative for dysuria and vaginal discharge.  Neurological: Negative.  Negative for dizziness and headaches.  Physical Exam   Blood pressure 137/90, pulse 81, temperature 98.2 F (36.8 C), temperature source Oral, resp. rate 15, unknown if currently breastfeeding.  Patient Vitals for the past 24 hrs:  BP Temp Temp src Pulse Resp SpO2  03/05/21 0959 118/83 -- -- 79 15 97 %  03/05/21 0926 -- 98.2 F (36.8 C) Oral -- 15 --  03/05/21 0911 137/90 -- -- 81 -- --     Physical Exam Vitals and nursing note reviewed.  Constitutional:      General: She is not in acute distress.    Appearance: She is well-developed.  HENT:     Head: Normocephalic.  Eyes:     Pupils: Pupils are equal, round, and reactive to light.  Cardiovascular:     Rate and Rhythm: Normal rate and regular rhythm.     Heart sounds: Normal heart sounds.  Pulmonary:     Effort: Pulmonary effort is normal. No  respiratory distress.     Breath sounds: Normal breath sounds.  Abdominal:     General: Bowel sounds are normal. There is no distension.     Palpations: Abdomen is soft.     Tenderness: There is no abdominal tenderness.  Genitourinary:    Comments: Protruding membranes from introitus with fetus present. Small amount of vaginal bleeding Skin:    General: Skin is warm and dry.  Neurological:     Mental Status: She is alert and oriented to person, place, and time.  Psychiatric:        Mood and Affect: Mood normal.        Behavior: Behavior normal.        Thought Content: Thought content normal.        Judgment: Judgment normal.    MAU Course  Procedures Results for orders placed or performed during the hospital encounter of 03/05/21 (from the past 24 hour(s))  CBC with Differential/Platelet     Status: Abnormal   Collection Time: 03/05/21  9:15 AM  Result Value Ref Range   WBC 5.0 4.0 - 10.5 K/uL   RBC 5.37 (H) 3.87 - 5.11 MIL/uL   Hemoglobin 12.3 12.0 - 15.0 g/dL   HCT 39.2 36.0 - 46.0 %   MCV 73.0 (L) 80.0 - 100.0 fL   MCH 22.9 (L) 26.0 - 34.0 pg   MCHC 31.4 30.0 - 36.0 g/dL   RDW 16.1 (H) 11.5 - 15.5 %   Platelets 222 150 - 400 K/uL   nRBC 0.0 0.0 - 0.2 %   Neutrophils Relative % 59 %   Neutro Abs 3.0 1.7 - 7.7 K/uL   Lymphocytes Relative 30 %   Lymphs Abs 1.5 0.7 - 4.0 K/uL   Monocytes Relative 9 %   Monocytes Absolute 0.4 0.1 - 1.0 K/uL   Eosinophils Relative 1 %   Eosinophils Absolute 0.1 0.0 - 0.5 K/uL   Basophils Relative 1 %   Basophils Absolute 0.0 0.0 - 0.1 K/uL   Immature Granulocytes 0 %   Abs Immature Granulocytes 0.02 0.00 - 0.07 K/uL  Type and screen [place]     Status: None   Collection Time: 03/05/21  9:15 AM  Result Value Ref Range   ABO/RH(D) O POS    Antibody Screen NEG    Sample Expiration      03/08/2021,2359 Performed at [facility], [address], [place], [place] [address]  Comprehensive metabolic panel      Status: Abnormal   Collection Time: 03/05/21  9:15 AM  Result Value Ref Range   Sodium 138 135 - 145 mmol/L   Potassium 3.8 3.5 - 5.1 mmol/L   Chloride 108 98 - 111 mmol/L   CO2 22 22 - 32 mmol/L   Glucose, Bld 103 (H) 70 - 99 mg/dL   BUN 10 6 - 20 mg/dL   Creatinine, Ser 0.58 0.44 - 1.00 mg/dL   Calcium 9.2 8.9 - 10.3 mg/dL   Total Protein 6.6 6.5 - 8.1 g/dL   Albumin 3.5 3.5 - 5.0 g/dL   AST 16 15 - 41 U/L   ALT 17 0 - 44 U/L   Alkaline Phosphatase 77 38 - 126 U/L   Total Bilirubin 0.5 0.3 - 1.2 mg/dL   GFR, Estimated >60 >60 mL/min   Anion gap 8 5 - 15    MDM CNM met patient at car and assisted in transfer into MAU. Upon assessment, fetus inside of amniotic sac protruding from vagina. Small amount of bright red active bleeding noted. Findings reviewed with patient and support person who verbalized understanding of inevitable delivery.   Amniotic sac ruptured and fetus delivered spontaneously at 0900. Speculum exam revealed placenta in vaginal vault and easily removed intact by CNM. Several small clots removed from vagina and approximately 238m of bleeding noted. After evacuation of blood from vault, scan blood noted at cervical os. Patient still denying any pain.  Patient and support person given time to see fetus alone.  CBC, CMP, Type and Screen O Pos blood type LR Bolus  Upon reassessment, scant bleeding on pad, patient denies any pain. Vital signs stable 1045: Consulted with Dr. BElgie Congo recommends calling community physician for preference on discharge vs observation  1050: Report given to Dr. FPamala Hurryregarding presentation, labs and current assessment- ok to discharge patient home to follow up in the office in 2 weeks with Dr. MBenjie Karvonen Bleeding reassessed before discharge and minimal on pad placed 2 hours before.   Assessment and Plan   1. Complete miscarriage   2. [redacted] weeks gestation of pregnancy   3. Incompetent cervix    -Discharge home in stable condition -Rx for  ibuprofen sent to patient's pharmacy -Vaginal bleeding and pain precautions discussed -Patient advised to follow-up with OB in 2 weeks  -Patient may return to MAU as needed or if her condition were to change or worsen   CWende MottCNM 03/05/2021, 9:26 AM

## 2021-03-14 ENCOUNTER — Encounter (HOSPITAL_COMMUNITY): Admission: RE | Payer: Self-pay | Source: Home / Self Care

## 2021-03-14 ENCOUNTER — Ambulatory Visit (HOSPITAL_COMMUNITY): Admission: RE | Admit: 2021-03-14 | Payer: 59 | Source: Home / Self Care | Admitting: Obstetrics & Gynecology

## 2021-03-14 SURGERY — CERCLAGE, CERVIX, VAGINAL APPROACH
Anesthesia: Spinal

## 2022-01-27 ENCOUNTER — Other Ambulatory Visit: Payer: Self-pay

## 2022-01-27 ENCOUNTER — Emergency Department (HOSPITAL_COMMUNITY)
Admission: EM | Admit: 2022-01-27 | Discharge: 2022-01-28 | Disposition: A | Discharge status: Home / Self Care | Payer: Medicaid Other | Attending: Emergency Medicine | Admitting: Emergency Medicine

## 2022-01-27 ENCOUNTER — Encounter (HOSPITAL_COMMUNITY): Payer: Self-pay | Admitting: *Deleted

## 2022-01-27 DIAGNOSIS — N939 Abnormal uterine and vaginal bleeding, unspecified: Secondary | ICD-10-CM | POA: Diagnosis not present

## 2022-01-27 DIAGNOSIS — R102 Pelvic and perineal pain: Secondary | ICD-10-CM | POA: Diagnosis not present

## 2022-01-27 LAB — CBC WITH DIFFERENTIAL/PLATELET
Abs Immature Granulocytes: 0.02 10*3/uL (ref 0.00–0.07)
Basophils Absolute: 0 10*3/uL (ref 0.0–0.1)
Basophils Relative: 0 %
Eosinophils Absolute: 0.1 10*3/uL (ref 0.0–0.5)
Eosinophils Relative: 2 %
HCT: 37.8 % (ref 36.0–46.0)
Hemoglobin: 11.9 g/dL — ABNORMAL LOW (ref 12.0–15.0)
Immature Granulocytes: 0 %
Lymphocytes Relative: 37 %
Lymphs Abs: 2.1 10*3/uL (ref 0.7–4.0)
MCH: 23.2 pg — ABNORMAL LOW (ref 26.0–34.0)
MCHC: 31.5 g/dL (ref 30.0–36.0)
MCV: 73.7 fL — ABNORMAL LOW (ref 80.0–100.0)
Monocytes Absolute: 0.5 10*3/uL (ref 0.1–1.0)
Monocytes Relative: 8 %
Neutro Abs: 3.1 10*3/uL (ref 1.7–7.7)
Neutrophils Relative %: 53 %
Platelets: 228 10*3/uL (ref 150–400)
RBC: 5.13 MIL/uL — ABNORMAL HIGH (ref 3.87–5.11)
RDW: 15 % (ref 11.5–15.5)
WBC: 5.9 10*3/uL (ref 4.0–10.5)
nRBC: 0 % (ref 0.0–0.2)

## 2022-01-27 LAB — COMPREHENSIVE METABOLIC PANEL
ALT: 27 U/L (ref 0–44)
AST: 22 U/L (ref 15–41)
Albumin: 3.8 g/dL (ref 3.5–5.0)
Alkaline Phosphatase: 79 U/L (ref 38–126)
Anion gap: 7 (ref 5–15)
BUN: 18 mg/dL (ref 6–20)
CO2: 27 mmol/L (ref 22–32)
Calcium: 9 mg/dL (ref 8.9–10.3)
Chloride: 103 mmol/L (ref 98–111)
Creatinine, Ser: 0.64 mg/dL (ref 0.44–1.00)
GFR, Estimated: >60 mL/min (ref >60)
Glucose, Bld: 98 mg/dL (ref 70–99)
Potassium: 3.6 mmol/L (ref 3.5–5.1)
Sodium: 137 mmol/L (ref 135–145)
Total Bilirubin: 0.2 mg/dL — ABNORMAL LOW (ref 0.3–1.2)
Total Protein: 7.1 g/dL (ref 6.5–8.1)

## 2022-01-27 LAB — I-STAT BETA HCG BLOOD, ED (MC, WL, AP ONLY): I-stat hCG, quantitative: <5 m[IU]/mL (ref <5)

## 2022-01-28 LAB — URINALYSIS, ROUTINE W REFLEX MICROSCOPIC
Bilirubin Urine: NEGATIVE
Glucose, UA: NEGATIVE mg/dL
Hgb urine dipstick: NEGATIVE
Ketones, ur: NEGATIVE mg/dL
Leukocytes,Ua: NEGATIVE
Nitrite: NEGATIVE
Protein, ur: NEGATIVE mg/dL
Specific Gravity, Urine: 1.012 (ref 1.005–1.030)
pH: 8 (ref 5.0–8.0)

## 2022-01-30 ENCOUNTER — Ambulatory Visit (HOSPITAL_COMMUNITY)
Admission: RE | Admit: 2022-01-30 | Discharge: 2022-01-30 | Disposition: A | Discharge status: Home / Self Care | Payer: Medicaid Other | Source: Ambulatory Visit | Attending: Emergency Medicine | Admitting: Emergency Medicine

## 2022-01-30 ENCOUNTER — Other Ambulatory Visit (HOSPITAL_COMMUNITY): Payer: Self-pay | Admitting: Emergency Medicine

## 2022-01-30 DIAGNOSIS — D251 Intramural leiomyoma of uterus: Secondary | ICD-10-CM | POA: Diagnosis not present

## 2022-01-30 DIAGNOSIS — R58 Hemorrhage, not elsewhere classified: Secondary | ICD-10-CM | POA: Diagnosis not present

## 2022-01-30 DIAGNOSIS — N939 Abnormal uterine and vaginal bleeding, unspecified: Secondary | ICD-10-CM | POA: Diagnosis not present

## 2022-03-26 ENCOUNTER — Ambulatory Visit
Admission: EM | Admit: 2022-03-26 | Discharge: 2022-03-26 | Disposition: A | Discharge status: Home / Self Care | Payer: Medicaid Other | Attending: Family Medicine | Admitting: Family Medicine

## 2022-03-26 DIAGNOSIS — Z3201 Encounter for pregnancy test, result positive: Secondary | ICD-10-CM

## 2022-03-26 LAB — POCT URINE PREGNANCY: Preg Test, Ur: POSITIVE — AB

## 2022-03-26 NOTE — ED Provider Notes (Signed)
[facility]    CSN: [id number] Arrival date & time: 03/26/22  1354      History   Chief Complaint Chief Complaint  Patient presents with   Possible Pregnancy    HPI Rebecca Webster is a 41 y.o. female.   Patient presenting today for pregnancy confirmation.  She states her last menstrual period was 02/28/2022 and she has had 4 home pregnancy test come back positive.  She is having some mild breast soreness but otherwise feeling well.  She denies any cramping, abdominal or pelvic pain, vaginal discharge, vaginal bleeding, dysuria, hematuria, nausea, vomiting.  She is needing confirmation so that she can schedule with her OB/GYN clinic for prenatal care.  She has a history of ectopic pregnancy, incompetent cervix.    Past Medical History:  Diagnosis Date   Anemia    not currently anemic   Ectopic pregnancy    Gestational HTN    Incompetent cervix     Patient Active Problem List   Diagnosis Date Noted   SVD (spontaneous vaginal delivery) 7/28 03/03/2020   Postpartum care following vaginal delivery 7/28 03/03/2020   Gestational hypertension 03/03/2020   Preterm labor 03/01/2020   Vaginal bleeding in pregnancy, second trimester 05/22/2019    Past Surgical History:  Procedure Laterality Date   abdominalplasty     CERVICAL CERCLAGE N/A 05/06/2019   Procedure: CERCLAGE CERVICAL;  Surgeon: Maxie Better, MD;  Location: [facility];  Service: Gynecology;  Laterality: N/A;   CERVICAL CERCLAGE N/A 09/29/2019   Procedure: CERCLAGE CERVICAL;  Surgeon: Maxie Better, MD;  Location: [facility];  Service: Gynecology;  Laterality: N/A;  EDD: 03/28/20   CESAREAN SECTION     Reversal of tubal ligation     TUBAL LIGATION     tubalr eversal      OB History     Gravida  8   Para  4   Term  3   Preterm  1   AB  2   Living  5      SAB  1   IAB      Ectopic  1   Multiple  1   Live Births  5            Home Medications    Prior to  Admission medications   Medication Sig Start Date End Date Taking? Authorizing Provider  alum & mag hydroxide-simeth (MAALOX/MYLANTA) 200-200-20 MG/5ML suspension Take 30 mLs by mouth every 6 (six) hours as needed for indigestion or heartburn.    [provider]  calcium carbonate (TUMS - DOSED IN MG ELEMENTAL CALCIUM) 500 MG chewable tablet Chew 2 tablets by mouth 3 (three) times daily as needed for indigestion or heartburn.    [provider]  ibuprofen (ADVIL) 800 MG tablet Take 1 tablet (800 mg total) by mouth 3 (three) times daily. 03/05/21   Rolm Bookbinder, CNM  Prenatal Vit-Fe Fumarate-FA (PRENATAL MULTIVITAMIN) TABS tablet Take 1 tablet by mouth daily at 12 noon.    [provider]    Family History Family History  Problem Relation Age of Onset   Heart disease Maternal Grandmother    Stroke Maternal Grandmother    Cancer Father     Social History Social History   Tobacco Use   Smoking status: Never   Smokeless tobacco: Never  Vaping Use   Vaping Use: Never used  Substance Use Topics   Alcohol use: No   Drug use: No  Allergies   Patient has no known allergies.   Review of Systems Review of Systems Per HPI  Physical Exam Triage Vital Signs ED Triage Vitals  Enc Vitals Group     BP 03/26/22 1443 119/86     Pulse Rate 03/26/22 1443 87     Resp 03/26/22 1443 18     Temp 03/26/22 1443 98.6 F (37 C)     Temp Source 03/26/22 1443 Oral     SpO2 03/26/22 1443 98 %     Weight --      Height --      Head Circumference --      Peak Flow --      Pain Score 03/26/22 1506 0     Pain Loc --      Pain Edu? --      Excl. in [place]? --    No data found.  Updated Vital Signs BP 119/86 (BP Location: Right Arm)   Pulse 87   Temp 98.6 F (37 C) (Oral)   Resp 18   LMP 02/28/2022 (Exact Date)   SpO2 98%   Visual Acuity Right Eye Distance:   Left Eye Distance:   Bilateral Distance:    Right Eye Near:   Left Eye Near:    Bilateral  Near:     Physical Exam Vitals and nursing note reviewed.  Constitutional:      Appearance: Normal appearance. She is not ill-appearing.  HENT:     Head: Atraumatic.     Mouth/Throat:     Mouth: Mucous membranes are moist.  Eyes:     Extraocular Movements: Extraocular movements intact.     Conjunctiva/sclera: Conjunctivae normal.  Cardiovascular:     Rate and Rhythm: Normal rate and regular rhythm.     Heart sounds: Normal heart sounds.  Pulmonary:     Effort: Pulmonary effort is normal.     Breath sounds: Normal breath sounds.  Musculoskeletal:        General: Normal range of motion.     Cervical back: Normal range of motion and neck supple.  Skin:    General: Skin is warm and dry.  Neurological:     Mental Status: She is alert and oriented to person, place, and time.  Psychiatric:        Mood and Affect: Mood normal.        Thought Content: Thought content normal.        Judgment: Judgment normal.      UC Treatments / Results  Labs (all labs ordered are listed, but only abnormal results are displayed) Labs Reviewed  POCT URINE PREGNANCY - Abnormal; Notable for the following components:      Result Value   Preg Test, Ur Positive (*)    All other components within normal limits    EKG   Radiology No results found.  Procedures Procedures (including critical care time)  Medications Ordered in UC Medications - No data to display  Initial Impression / Assessment and Plan / UC Course  I have reviewed the triage vital signs and the nursing notes.  Pertinent labs & imaging results that were available during my care of the patient were reviewed by me and considered in my medical decision making (see chart for details).     Urine pregnancy test positive today, result given to patient and reviewed prenatal lifestyle changes, prenatal vitamins and OB/GYN follow-up.  Return for any worsening symptoms.  Final Clinical Impressions(s) / UC Diagnoses   Final  diagnoses:  Positive pregnancy test   Discharge Instructions   None    ED Prescriptions   None    PDMP not reviewed this encounter.   Particia Nearing, [place] 03/26/22 1514

## 2022-03-26 NOTE — ED Triage Notes (Signed)
Pt reports taking 4 pregnancy tests at home. States all were positive and is here for confirmation. C/o breast pain and abdominal cramping.

## 2022-03-27 ENCOUNTER — Telehealth: Payer: Self-pay | Admitting: Obstetrics & Gynecology

## 2022-03-27 NOTE — Telephone Encounter (Signed)
Spoke to patient she states that she has had pasted ectopic pregnancies in the past, as well as SAB, and other issues. She is wanting to know if she needs to be seen sooner, or at least HCG levels checked. The first dating we have is 9/21

## 2022-03-28 ENCOUNTER — Other Ambulatory Visit: Payer: Medicaid Other

## 2022-03-28 NOTE — Telephone Encounter (Signed)
Return patient's call.  Based on LMP she is 4wks today which would be too early for u/s.  Advised per Dr Charlotta Newton, can do serial HCG levels to make sure level is rising appropriately.  Will have lab drawn today and again on Friday.  Pt verbalized understanding and agreeable to plan.

## 2022-03-30 ENCOUNTER — Other Ambulatory Visit: Payer: Medicaid Other

## 2022-03-30 DIAGNOSIS — Z8759 Personal history of other complications of pregnancy, childbirth and the puerperium: Secondary | ICD-10-CM | POA: Diagnosis not present

## 2022-03-30 DIAGNOSIS — Z3490 Encounter for supervision of normal pregnancy, unspecified, unspecified trimester: Secondary | ICD-10-CM | POA: Diagnosis not present

## 2022-03-31 ENCOUNTER — Other Ambulatory Visit: Payer: Self-pay | Admitting: Obstetrics & Gynecology

## 2022-03-31 DIAGNOSIS — Z3490 Encounter for supervision of normal pregnancy, unspecified, unspecified trimester: Secondary | ICD-10-CM

## 2022-03-31 LAB — BETA HCG QUANT (REF LAB): hCG Quant: 657 m[IU]/mL

## 2022-03-31 NOTE — Progress Notes (Signed)
Needs HCG follow up in 48hrs

## 2022-04-02 ENCOUNTER — Other Ambulatory Visit: Payer: Medicaid Other

## 2022-04-02 DIAGNOSIS — Z3490 Encounter for supervision of normal pregnancy, unspecified, unspecified trimester: Secondary | ICD-10-CM | POA: Diagnosis not present

## 2022-04-03 LAB — BETA HCG QUANT (REF LAB): hCG Quant: 2186 m[IU]/mL

## 2022-04-06 ENCOUNTER — Telehealth: Payer: Self-pay | Admitting: *Deleted

## 2022-04-06 ENCOUNTER — Other Ambulatory Visit: Payer: Self-pay | Admitting: Obstetrics & Gynecology

## 2022-04-06 DIAGNOSIS — N883 Incompetence of cervix uteri: Secondary | ICD-10-CM

## 2022-04-06 MED ORDER — PROGESTERONE 200 MG VA SUPP: 200.0000 mg | Freq: Every day | VAGINAL | 1 refills | Status: DC

## 2022-04-06 NOTE — Progress Notes (Signed)
Vaginal progesterone sent in

## 2022-04-06 NOTE — Telephone Encounter (Signed)
Pt is a HROB and is in need of medication. Please advise pt. Pt is requesting a call

## 2022-04-06 NOTE — Telephone Encounter (Signed)
Patient is early pregnant approx 5wk3d and is requesting Progerstone be sent in to her pharmacy.  She has a history of cervical incompetence with cerclage placement x2.  Has been on progesterone with previous pregnancies.  Dating u/s scheduled for 04/26/22.  Advised patient to check back with her pharmacy later today if she did not hear back from our office.

## 2022-04-19 ENCOUNTER — Inpatient Hospital Stay (HOSPITAL_COMMUNITY): Payer: Medicaid Other

## 2022-04-19 ENCOUNTER — Encounter (HOSPITAL_COMMUNITY): Payer: Self-pay | Admitting: *Deleted

## 2022-04-19 ENCOUNTER — Inpatient Hospital Stay (HOSPITAL_COMMUNITY)
Admission: AD | Admit: 2022-04-19 | Discharge: 2022-04-19 | Disposition: A | Discharge status: Home / Self Care | Payer: Medicaid Other | Attending: Obstetrics & Gynecology | Admitting: Obstetrics & Gynecology

## 2022-04-19 DIAGNOSIS — R1031 Right lower quadrant pain: Secondary | ICD-10-CM | POA: Insufficient documentation

## 2022-04-19 DIAGNOSIS — Z3A01 Less than 8 weeks gestation of pregnancy: Secondary | ICD-10-CM

## 2022-04-19 DIAGNOSIS — O3680X0 Pregnancy with inconclusive fetal viability, not applicable or unspecified: Secondary | ICD-10-CM | POA: Diagnosis not present

## 2022-04-19 DIAGNOSIS — O3431 Maternal care for cervical incompetence, first trimester: Secondary | ICD-10-CM | POA: Diagnosis not present

## 2022-04-19 DIAGNOSIS — Z3490 Encounter for supervision of normal pregnancy, unspecified, unspecified trimester: Secondary | ICD-10-CM

## 2022-04-19 DIAGNOSIS — O26851 Spotting complicating pregnancy, first trimester: Secondary | ICD-10-CM | POA: Diagnosis not present

## 2022-04-19 DIAGNOSIS — O34219 Maternal care for unspecified type scar from previous cesarean delivery: Secondary | ICD-10-CM | POA: Diagnosis not present

## 2022-04-19 DIAGNOSIS — O26891 Other specified pregnancy related conditions, first trimester: Secondary | ICD-10-CM | POA: Insufficient documentation

## 2022-04-19 LAB — URINALYSIS, ROUTINE W REFLEX MICROSCOPIC
Bilirubin Urine: NEGATIVE
Glucose, UA: NEGATIVE mg/dL
Hgb urine dipstick: NEGATIVE
Ketones, ur: NEGATIVE mg/dL
Leukocytes,Ua: NEGATIVE
Nitrite: NEGATIVE
Protein, ur: NEGATIVE mg/dL
Specific Gravity, Urine: 1.008 (ref 1.005–1.030)
pH: 7 (ref 5.0–8.0)

## 2022-04-19 NOTE — MAU Note (Signed)
.  Rebecca Webster is a 41 y.o. at Unknown here in MAU reporting: c/o pain in her RLQ and some spotting on and off for the past 3 days. LMP: 02/28/22 Onset of complaint: 3 days Pain score: 3 Vitals:   04/19/22 0833  BP: 123/77  Pulse: 77  Resp: 18  Temp: 98.7 F (37.1 C)     FHT:n/a Lab orders placed from triage:  u/a

## 2022-04-19 NOTE — MAU Provider Note (Signed)
History     [id number]  Arrival date and time: 04/19/22 7989    Chief Complaint  Patient presents with   Abdominal Pain   Vaginal Bleeding     HPI Rebecca Webster is a 41 y.o. at Unknown by LMP with PMHx notable for ectopic pregnancy in 2019, CS for twins in 2001 followed by VBAC, who presents for RLQ pain.   Per review of notes had outpatient trend of HCG levels at end of August with appropriate rise from 657>2,186 over three days  Over the past three days has had intermittent RLQ pain as well as mild vaginal spotting She is worried as she has had an ectopic pregnancy previously Otherwise no symptoms      OB History     Gravida  9   Para  4   Term  3   Preterm  1   AB  2   Living  5      SAB  1   IAB      Ectopic  1   Multiple  1   Live Births  5           Past Medical History:  Diagnosis Date   Anemia    not currently anemic   Ectopic pregnancy    Gestational HTN    Incompetent cervix     Past Surgical History:  Procedure Laterality Date   abdominalplasty     CERVICAL CERCLAGE N/A 05/06/2019   Procedure: CERCLAGE CERVICAL;  Surgeon: Maxie Better, MD;  Location: [facility];  Service: Gynecology;  Laterality: N/A;   CERVICAL CERCLAGE N/A 09/29/2019   Procedure: CERCLAGE CERVICAL;  Surgeon: Maxie Better, MD;  Location: [facility];  Service: Gynecology;  Laterality: N/A;  EDD: 03/28/20   CESAREAN SECTION     Reversal of tubal ligation     TUBAL LIGATION     tubalr eversal      Family History  Problem Relation Age of Onset   Heart disease Maternal Grandmother    Stroke Maternal Grandmother    Cancer Father     Social History   Socioeconomic History   Marital status: Married    Spouse name: Not on file   Number of children: Not on file   Years of education: Not on file   Highest education level: Not on file  Occupational History   Not on file  Tobacco Use   Smoking status: Never   Smokeless tobacco: Never   Vaping Use   Vaping Use: Never used  Substance and Sexual Activity   Alcohol use: No   Drug use: No   Sexual activity: Yes    Birth control/protection: None  Other Topics Concern   Not on file  Social History Narrative   Not on file   Social Determinants of Health   Financial Resource Strain: Not on file  Food Insecurity: Not on file  Transportation Needs: Not on file  Physical Activity: Not on file  Stress: Not on file  Social Connections: Not on file  Intimate Partner Violence: Not on file    No Known Allergies  No current facility-administered medications on file prior to encounter.   Current Outpatient Medications on File Prior to Encounter  Medication Sig Dispense Refill   calcium carbonate (TUMS - DOSED IN MG ELEMENTAL CALCIUM) 500 MG chewable tablet Chew 2 tablets by mouth 3 (three) times daily as needed for indigestion or heartburn.     Prenatal Vit-Fe Fumarate-FA (PRENATAL MULTIVITAMIN) TABS  tablet Take 1 tablet by mouth daily at 12 noon.     alum & mag hydroxide-simeth (MAALOX/MYLANTA) 200-200-20 MG/5ML suspension Take 30 mLs by mouth every 6 (six) hours as needed for indigestion or heartburn.     ibuprofen (ADVIL) 800 MG tablet Take 1 tablet (800 mg total) by mouth 3 (three) times daily. (Patient not taking: Reported on 04/19/2022) 30 tablet 0   progesterone 200 MG SUPP Place 1 suppository (200 mg total) vaginally at bedtime. 90 suppository 1     ROS Pertinent positives and negative per HPI, all others reviewed and negative  Physical Exam   BP 123/77   Pulse 77   Temp 98.7 F (37.1 C)   Resp 18   Ht 5\' 10"  (1.778 m)   Wt 94.3 kg   LMP 02/28/2022 (Exact Date)   BMI 29.84 kg/m   Patient Vitals for the past 24 hrs:  BP Temp Pulse Resp Height Weight  04/19/22 0833 123/77 98.7 F (37.1 C) 77 18 5\' 10"  (1.778 m) 94.3 kg    Physical Exam Vitals reviewed.  Constitutional:      General: She is not in acute distress.    Appearance: She is  well-developed. She is not diaphoretic.  Eyes:     General: No scleral icterus. Pulmonary:     Effort: Pulmonary effort is normal. No respiratory distress.  Abdominal:     General: There is no distension.     Palpations: Abdomen is soft.     Tenderness: There is no abdominal tenderness. There is no guarding or rebound.  Skin:    General: Skin is warm and dry.  Neurological:     Mental Status: She is alert.     Coordination: Coordination normal.      Cervical Exam    Bedside Ultrasound Pt informed that the ultrasound is considered a limited OB ultrasound and is not intended to be a complete ultrasound exam.  Patient also informed that the ultrasound is not being completed with the intent of assessing for fetal or placental anomalies or any pelvic abnormalities.  Explained that the purpose of today's ultrasound is to assess for  viability.  Patient acknowledges the purpose of the exam and the limitations of the study.    My interpretation: likely IUP seen, IUGS present but due to resolution of images unable to definitively distinguish a fetal pole/FHR   Labs Results for orders placed or performed during the hospital encounter of 04/19/22 (from the past 24 hour(s))  Urinalysis, Routine w reflex microscopic Urine, Clean Catch     Status: Abnormal   Collection Time: 04/19/22 10:15 AM  Result Value Ref Range   Color, Urine STRAW (A) YELLOW   APPearance CLEAR CLEAR   Specific Gravity, Urine 1.008 1.005 - 1.030   pH 7.0 5.0 - 8.0   Glucose, UA NEGATIVE NEGATIVE mg/dL   Hgb urine dipstick NEGATIVE NEGATIVE   Bilirubin Urine NEGATIVE NEGATIVE   Ketones, ur NEGATIVE NEGATIVE mg/dL   Protein, ur NEGATIVE NEGATIVE mg/dL   Nitrite NEGATIVE NEGATIVE   Leukocytes,Ua NEGATIVE NEGATIVE    Imaging 04/21/22 OB LESS THAN 14 WEEKS WITH OB TRANSVAGINAL  Result Date: 04/19/2022 CLINICAL DATA:  Pregnancy of unknown location EXAM: OBSTETRIC <14 WK [place] AND TRANSVAGINAL OB 04/21/2022 TECHNIQUE: Both  transabdominal and transvaginal ultrasound examinations were performed for complete evaluation of the gestation as well as the maternal uterus, adnexal regions, and pelvic cul-de-sac. Transvaginal technique was performed to assess early pregnancy. COMPARISON:  Ultrasound pelvis dated 01/30/2022  FINDINGS: Intrauterine gestational sac: Single Yolk sac:  Visualized. Embryo:  Not Visualized. Cardiac Activity: Not Visualized. Heart Rate: N/A MSD: 21.5 mm   7 w   1 d CRL: N/A Subchorionic hemorrhage:  None visualized. Maternal uterus/adnexae: Normal gravid uterus and ovaries. Corpus luteum within the left ovary. Small volume pelvic free fluid. No adnexal masses. IMPRESSION: Early intrauterine gestational sac at 7 weeks 1 day. Electronically Signed   By: Agustin Cree M.D.   On: 04/19/2022 10:42    MAU Course  Procedures Lab Orders         Urinalysis, Routine w reflex microscopic Urine, Clean Catch    No orders of the defined types were placed in this encounter.  Imaging Orders         US OB LESS THAN 14 WEEKS WITH OB TRANSVAGINAL     MDM moderate  Assessment and Plan  #IUP #Pregnancy of uncertain viability #[redacted] weeks gestation of pregnancy Ruled out for ectopic. Discussed some features of presentation are concerning for nonviable pregnancy with irregular GS and somewhat behind her sure LMP, but will need to see what follow up US shows in a week. She is amenable with this plan.    Dispo: discharged to home in stable condition.   Venora Maples, MD/MPH 04/19/22 11:05 AM  Allergies as of 04/19/2022   No Known Allergies      Medication List     STOP taking these medications    ibuprofen 800 MG tablet Commonly known as: ADVIL       TAKE these medications    alum & mag hydroxide-simeth 200-200-20 MG/5ML suspension Commonly known as: MAALOX/MYLANTA Take 30 mLs by mouth every 6 (six) hours as needed for indigestion or heartburn.   calcium carbonate 500 MG chewable tablet Commonly  known as: TUMS - dosed in mg elemental calcium Chew 2 tablets by mouth 3 (three) times daily as needed for indigestion or heartburn.   prenatal multivitamin Tabs tablet Take 1 tablet by mouth daily at 12 noon.   progesterone 200 MG Supp Place 1 suppository (200 mg total) vaginally at bedtime.

## 2022-04-19 NOTE — Discharge Instructions (Signed)
Return to MAU if Bleeding become heavy where you are soaking a pad every 1-2 hours and passing  large clots. Feel dizzy like you are going to pass out or strong abd pain that is not relieved by tylenol.

## 2022-04-25 ENCOUNTER — Other Ambulatory Visit: Payer: Self-pay | Admitting: Obstetrics & Gynecology

## 2022-04-25 DIAGNOSIS — O3680X0 Pregnancy with inconclusive fetal viability, not applicable or unspecified: Secondary | ICD-10-CM

## 2022-04-26 ENCOUNTER — Ambulatory Visit: Payer: Medicaid Other | Admitting: Adult Health

## 2022-04-26 ENCOUNTER — Other Ambulatory Visit: Payer: Self-pay | Admitting: Obstetrics & Gynecology

## 2022-04-26 ENCOUNTER — Ambulatory Visit (INDEPENDENT_AMBULATORY_CARE_PROVIDER_SITE_OTHER): Payer: Medicaid Other

## 2022-04-26 DIAGNOSIS — Z3A01 Less than 8 weeks gestation of pregnancy: Secondary | ICD-10-CM | POA: Diagnosis not present

## 2022-04-26 DIAGNOSIS — O3680X0 Pregnancy with inconclusive fetal viability, not applicable or unspecified: Secondary | ICD-10-CM

## 2022-04-26 NOTE — Progress Notes (Signed)
[place] TA/TV 5+6 wks,single IUP,small yolk sac,no fetal heart tones,irregular gestational sac 22.7 mm=7+2 wks,GS is located in the lower uterine segment,normal left ovary,complex right corpus luteal cyst 2 x 1.6 cm,pt will come back today to f/u with Anderson Malta

## 2022-04-27 ENCOUNTER — Ambulatory Visit (INDEPENDENT_AMBULATORY_CARE_PROVIDER_SITE_OTHER): Payer: Medicaid Other | Admitting: Adult Health

## 2022-04-27 ENCOUNTER — Encounter: Payer: Self-pay | Admitting: Adult Health

## 2022-04-27 VITALS — BP 121/80 | HR 78 | Ht 71.0 in | Wt 211.6 lb

## 2022-04-27 DIAGNOSIS — O039 Complete or unspecified spontaneous abortion without complication: Secondary | ICD-10-CM | POA: Diagnosis not present

## 2022-04-27 MED ORDER — MISOPROSTOL 200 MCG PO TABS: 200.0000 ug | ORAL_TABLET | Freq: Four times a day (QID) | ORAL | 4 refills | Status: DC

## 2022-04-27 MED ORDER — VITAFOL ULTRA 29-0.6-0.4-200 MG PO CAPS: 1.0000 | ORAL_CAPSULE | Freq: Every day | ORAL | 12 refills | Status: DC

## 2022-04-27 NOTE — Progress Notes (Signed)
  Subjective:     Patient ID: Rebecca Webster, female   DOB: 05-Sep-1980, 41 y.o.   MRN: [id number]  HPI Paytan is a 41 year old black female,married, [id number], back in follow up of having [place] yesterday. Should be 7+2 weeks, but  [place] 5+6 no FHT, and irregualr sac. QHCG was 2186 and blood type O+.  Review of Systems Denies any spotting Reviewed past medical,surgical, social and family history. Reviewed medications and allergies.     Objective:   Physical Exam BP 121/80 (BP Location: Right Arm, Patient Position: Sitting, Cuff Size: Normal)   Pulse 78   Ht 5\' 11"  (1.803 m)   Wt 211 lb 9.6 oz (96 kg)   LMP 02/28/2022 (Exact Date)   Breastfeeding No   BMI 29.51 kg/m  Skin warm and dry.  Lungs: clear to ausculation bilaterally. Cardiovascular: regular rate and rhythm.  Reviewed [place] with pt.   [place] TA/TV 5+6 wks,single IUP,small yolk sac,no fetal heart tones,irregular gestational sac 22.7 mm=7+2 wks,GS is located in the lower uterine segment,normal left ovary,complex right corpus luteal cyst 2 x 1.6 cm, Fall risk is low    04/27/2022    8:41 AM  Depression screen PHQ 2/9  Decreased Interest 0  Down, Depressed, Hopeless 0  PHQ - 2 Score 0     Upstream - 04/27/22 0840       Pregnancy Intention Screening   Does the patient want to become pregnant in the next year? Yes    Does the patient's partner want to become pregnant in the next year? Yes    Would the patient like to discuss contraceptive options today? No      Contraception Wrap Up   Current Method Pregnant/Seeking Pregnancy    End Method Pregnant/Seeking Pregnancy    Contraception Counseling Provided No             Assessment:     1. Miscarriage Discussed options of waiting to see what her body does or cutotec and she uses cytotec Meds ordered this encounter  Medications   Prenat-Fe Poly-Methfol-FA-DHA (VITAFOL ULTRA) 29-0.6-0.4-200 MG CAPS    Sig: Take 1 capsule by mouth daily.    Dispense:  30 capsule    Refill:  12     Order Specific Question:   Supervising Provider    Answer:   Tania Ade H [[id number]]   misoprostol (CYTOTEC) 200 MCG tablet    Sig: Take 1 tablet (200 mcg total) by mouth 4 (four) times daily. Place 4 tabs in vagina at bedtime and repeat in 48 hours if needed    Dispense:  4 tablet    Refill:  4    Order Specific Question:   Supervising Provider    Answer:   Florian Buff [[id number]]   Check QHCG today, will talk Monday     Plan:     Follow up in 2 weeks, will do pap then

## 2022-04-28 LAB — BETA HCG QUANT (REF LAB): hCG Quant: 20100 m[IU]/mL

## 2022-05-11 ENCOUNTER — Other Ambulatory Visit (HOSPITAL_COMMUNITY)
Admission: RE | Admit: 2022-05-11 | Discharge: 2022-05-11 | Disposition: A | Discharge status: Home / Self Care | Payer: Medicaid Other | Source: Ambulatory Visit | Attending: Adult Health | Admitting: Adult Health

## 2022-05-11 ENCOUNTER — Encounter: Payer: Self-pay | Admitting: Adult Health

## 2022-05-11 ENCOUNTER — Ambulatory Visit (INDEPENDENT_AMBULATORY_CARE_PROVIDER_SITE_OTHER): Payer: Medicaid Other | Admitting: Adult Health

## 2022-05-11 VITALS — BP 124/85 | HR 75 | Ht 70.0 in | Wt 215.5 lb

## 2022-05-11 DIAGNOSIS — Z3201 Encounter for pregnancy test, result positive: Secondary | ICD-10-CM

## 2022-05-11 DIAGNOSIS — Z124 Encounter for screening for malignant neoplasm of cervix: Secondary | ICD-10-CM | POA: Insufficient documentation

## 2022-05-11 DIAGNOSIS — O039 Complete or unspecified spontaneous abortion without complication: Secondary | ICD-10-CM | POA: Diagnosis not present

## 2022-05-11 LAB — POCT URINE PREGNANCY: Preg Test, Ur: POSITIVE — AB

## 2022-05-11 NOTE — Progress Notes (Signed)
  Subjective:     Patient ID: Rebecca Webster, female   DOB: [birth year], 41 y.o.   MRN: [id number]  HPI Rebecca Webster is a 41 year old black female, married, [id number], back in follow up on miscarriage, she took Cytotec 04/27/22 and had bleeding and passed tissue. Blood type is O+ QHCG was 20,100 on 04/27/22. She needs a pap.  Review of Systems No bleeding now Reviewed past medical,surgical, social and family history. Reviewed medications and allergies.     Objective:   Physical Exam BP 124/85 (BP Location: Left Arm, Patient Position: Sitting, Cuff Size: Normal)   Pulse 75   Ht 5\' 10"  (1.778 m)   Wt 215 lb 8 oz (97.8 kg)   LMP 02/28/2022 (Exact Date)   Breastfeeding No   BMI 30.92 kg/m  UPT is + Skin warm and dry.Pelvic: external genitalia is normal in appearance no lesions, vagina: pink, and moist,urethra has no lesions or masses noted, cervix:smooth and bulbous, Pap with GC/CHL and HR HPV genotyping performed,uterus: normal size, shape and contour, non tender, no masses felt, adnexa: no masses or tenderness noted. Bladder is non tender and no masses felt.    Upstream - 05/11/22 0912       Pregnancy Intention Screening   Does the patient want to become pregnant in the next year? Yes    Does the patient's partner want to become pregnant in the next year? Yes    Would the patient like to discuss contraceptive options today? No      Contraception Wrap Up   Current Method Pregnant/Seeking Pregnancy    End Method Pregnant/Seeking Pregnancy            Examination chaperoned by Tish RN    Assessment:     1. Routine Papanicolaou smear Pap sent Pap in 3 years if normal  2. Pregnancy examination or test, positive result  3. Miscarriage Will check [facility] today, and talk when results back Continue PNV She does want to get pregnant again     Plan:     Follow up TBD

## 2022-05-12 LAB — BETA HCG QUANT (REF LAB): hCG Quant: 57 m[IU]/mL

## 2022-05-14 ENCOUNTER — Other Ambulatory Visit: Payer: Self-pay | Admitting: Adult Health

## 2022-05-14 DIAGNOSIS — O039 Complete or unspecified spontaneous abortion without complication: Secondary | ICD-10-CM

## 2022-05-15 LAB — CYTOLOGY - PAP
Chlamydia: NEGATIVE
Comment: NEGATIVE
Comment: NEGATIVE
Comment: NORMAL
Diagnosis: NEGATIVE
High risk HPV: NEGATIVE
Neisseria Gonorrhea: NEGATIVE

## 2022-07-16 ENCOUNTER — Other Ambulatory Visit: Payer: Self-pay | Admitting: Adult Health

## 2022-07-16 DIAGNOSIS — Z3201 Encounter for pregnancy test, result positive: Secondary | ICD-10-CM | POA: Diagnosis not present

## 2022-07-16 NOTE — Progress Notes (Signed)
Ck QHCG  

## 2022-07-17 ENCOUNTER — Other Ambulatory Visit: Payer: Self-pay | Admitting: Adult Health

## 2022-07-17 DIAGNOSIS — Z3201 Encounter for pregnancy test, result positive: Secondary | ICD-10-CM

## 2022-07-17 LAB — BETA HCG QUANT (REF LAB): hCG Quant: 65 m[IU]/mL

## 2022-07-18 DIAGNOSIS — Z3201 Encounter for pregnancy test, result positive: Secondary | ICD-10-CM | POA: Diagnosis not present

## 2022-07-19 LAB — BETA HCG QUANT (REF LAB): hCG Quant: 178 m[IU]/mL

## 2022-08-06 NOTE — L&D Delivery Note (Signed)
[facility]N [facility] Delivery Note  Rebecca Webster is a 42 y.o. [id number] s/p vag delivery at [redacted]w[redacted]d. She was admitted for IOL due to gHTN.   ROM: with clear fluid GBS Status: neg Maximum Maternal Temperature: 98.7  Labor Progress: Rebecca Webster was admitted for IOL due to gHTN; prev C/S for breech preterm twins followed by VBAC x 2; she was induced with Pitocin and then had SROM prior to becoming complete. BPs were normal during labor with neg pre-e labs.  Delivery Date/Time: August 2nd, 2024 at 2056 Delivery: Called to room and patient was complete and pushing. Head delivered OA. No nuchal cord present. Shoulder and body delivered in usual fashion. Infant with spontaneous cry, placed on mother's abdomen, dried and stimulated. Cord clamped x 2 after 1-minute delay, and cut by FOB. Cord blood drawn. Placenta delivered spontaneously with gentle cord traction. Fundus firm with massage and Pitocin. Labia, perineum, vagina, and cervix inspected and found to be intact.   Placenta: spont, intact; to L&D Complications: none Lacerations: none EBL: 103cc Analgesia: epidural  Postpartum Planning [x]  message to sent to schedule follow-up including 1wk BP check [x]  Lasix 20mg  x 5d  Infant: boy  APGARs 8/9  2778g (6lb 2oz)  Rebecca Webster, CNM  03/08/2023 10:23 PM   Prior to transferring to [facility] room, CNM called to evaluate clots/trickle after giving cytotec PO and PR; bimanual exam reveal additional mod-sized clots at her cervix, probably the size of a grapefruit in total, removed with resolution of the trickle. EBL all inclusive from delivery and the time in recovery was 736cc. CBC drawn at 0010 for epidural removal with Hgb noted to be 11.5.  Rebecca Webster 03/09/2023 12:34 AM

## 2022-08-17 ENCOUNTER — Other Ambulatory Visit: Payer: Self-pay | Admitting: Obstetrics & Gynecology

## 2022-08-17 DIAGNOSIS — O3680X0 Pregnancy with inconclusive fetal viability, not applicable or unspecified: Secondary | ICD-10-CM

## 2022-08-20 ENCOUNTER — Encounter: Payer: Self-pay | Admitting: *Deleted

## 2022-08-20 ENCOUNTER — Ambulatory Visit (INDEPENDENT_AMBULATORY_CARE_PROVIDER_SITE_OTHER): Payer: Medicaid Other | Admitting: *Deleted

## 2022-08-20 ENCOUNTER — Ambulatory Visit (INDEPENDENT_AMBULATORY_CARE_PROVIDER_SITE_OTHER): Payer: Medicaid Other

## 2022-08-20 VITALS — BP 128/87 | HR 77 | Wt 215.0 lb

## 2022-08-20 DIAGNOSIS — O0990 Supervision of high risk pregnancy, unspecified, unspecified trimester: Secondary | ICD-10-CM | POA: Insufficient documentation

## 2022-08-20 DIAGNOSIS — O3680X0 Pregnancy with inconclusive fetal viability, not applicable or unspecified: Secondary | ICD-10-CM

## 2022-08-20 DIAGNOSIS — Z98891 History of uterine scar from previous surgery: Secondary | ICD-10-CM

## 2022-08-20 DIAGNOSIS — Z3A08 8 weeks gestation of pregnancy: Secondary | ICD-10-CM | POA: Diagnosis not present

## 2022-08-20 DIAGNOSIS — Z8759 Personal history of other complications of pregnancy, childbirth and the puerperium: Secondary | ICD-10-CM | POA: Insufficient documentation

## 2022-08-20 DIAGNOSIS — O139 Gestational [pregnancy-induced] hypertension without significant proteinuria, unspecified trimester: Secondary | ICD-10-CM | POA: Insufficient documentation

## 2022-08-20 DIAGNOSIS — O0991 Supervision of high risk pregnancy, unspecified, first trimester: Secondary | ICD-10-CM

## 2022-08-20 NOTE — Progress Notes (Signed)
   INITIAL OB NURSE INTAKE  SUBJECTIVE:  Rebecca Webster is a 42 y.o. [id number] female [redacted]w[redacted]d by LMP c/w today's U/S with an Estimated Date of Delivery: 03/28/23 being seen today for her initial OB intake/educational visit with RN. She is taking prenatal vitamins.  She is not having nausea and/or vomiting and does not request nausea meds at this time.  Patient's last menstrual period was 06/21/2022 (exact date).  Patient's medical, surgical, and obstetrical history obtained and reviewed.  Current medications reviewed.   Patient Active Problem List   Diagnosis Date Noted   Supervision of high-risk pregnancy 08/20/2022   History of vaginal delivery following previous cesarean delivery 08/20/2022   History of gestational hypertension 08/20/2022   History of preterm premature rupture of membranes 08/20/2022   History of cervical cerclage 09/29/2019   Past Medical History:  Diagnosis Date   Anemia    not currently anemic   Ectopic pregnancy    Gestational HTN    Incompetent cervix    Miscarriage    Past Surgical History:  Procedure Laterality Date   abdominalplasty     CERVICAL CERCLAGE N/A 05/06/2019   Procedure: CERCLAGE CERVICAL;  Surgeon: Servando Salina, MD;  Location: [facility] LD ORS;  Service: Gynecology;  Laterality: N/A;   CERVICAL CERCLAGE N/A 09/29/2019   Procedure: CERCLAGE CERVICAL;  Surgeon: Servando Salina, MD;  Location: [facility] LD ORS;  Service: Gynecology;  Laterality: N/A;  EDD: 03/28/20   CESAREAN SECTION     Reversal of tubal ligation     TUBAL LIGATION     tubalr eversal     OB History     Gravida  10   Para  5   Term  3   Preterm  2   AB  4   Living  5      SAB  3   IAB      Ectopic  1   Multiple  1   Live Births  5           OBJECTIVE:  BP 128/87   Pulse 77   Wt 215 lb (97.5 kg)   LMP 06/21/2022 (Exact Date)   BMI 30.85 kg/m   ASSESSMENT/PLAN: [id number] at [redacted]w[redacted]d with an Estimated Date of Delivery: 03/28/23  Prenatal vitamins:  continue   Nausea medicines: not currently needed   OB packet given: Yes BabyScripts and MyChart activated  Blood Pressure Cuff: has at home. Discussed to be used for virtual visits and home BP checks.  Genetic & carrier screening discussed: requests Panorama and NT/IT,  Placed OB Box on problem list and updated Reviewed recommended weight gain based on pre-gravid BMI BMI 25-29.9, gain max 15-25lb  Follow-up in 4 weeks for NT U/S & New OB visit with provider  Face-to-face time at least 30 minutes. 50% or more of this visit was spent in counseling and coordination of care.  Rebecca Miss [name] RN-C 08/20/2022 1:36 PM

## 2022-08-20 NOTE — Progress Notes (Signed)
[place] 8+4 wks,single IUP,FHR 178 bpm,normal ovaries,CRL 23.73 mm

## 2022-09-11 ENCOUNTER — Other Ambulatory Visit: Payer: Self-pay | Admitting: Obstetrics & Gynecology

## 2022-09-11 DIAGNOSIS — Z3682 Encounter for antenatal screening for nuchal translucency: Secondary | ICD-10-CM

## 2022-09-12 ENCOUNTER — Encounter: Payer: Self-pay | Admitting: Advanced Practice Midwife

## 2022-09-12 ENCOUNTER — Other Ambulatory Visit: Payer: Self-pay | Admitting: Advanced Practice Midwife

## 2022-09-12 ENCOUNTER — Ambulatory Visit (INDEPENDENT_AMBULATORY_CARE_PROVIDER_SITE_OTHER): Payer: Medicaid Other

## 2022-09-12 ENCOUNTER — Ambulatory Visit (INDEPENDENT_AMBULATORY_CARE_PROVIDER_SITE_OTHER): Payer: Medicaid Other | Admitting: Advanced Practice Midwife

## 2022-09-12 VITALS — BP 126/78 | HR 86 | Wt 216.6 lb

## 2022-09-12 DIAGNOSIS — Z363 Encounter for antenatal screening for malformations: Secondary | ICD-10-CM

## 2022-09-12 DIAGNOSIS — Z3A11 11 weeks gestation of pregnancy: Secondary | ICD-10-CM

## 2022-09-12 DIAGNOSIS — Z98891 History of uterine scar from previous surgery: Secondary | ICD-10-CM

## 2022-09-12 DIAGNOSIS — Z1379 Encounter for other screening for genetic and chromosomal anomalies: Secondary | ICD-10-CM

## 2022-09-12 DIAGNOSIS — Z3481 Encounter for supervision of other normal pregnancy, first trimester: Secondary | ICD-10-CM | POA: Diagnosis not present

## 2022-09-12 DIAGNOSIS — O09529 Supervision of elderly multigravida, unspecified trimester: Secondary | ICD-10-CM | POA: Insufficient documentation

## 2022-09-12 DIAGNOSIS — Z3682 Encounter for antenatal screening for nuchal translucency: Secondary | ICD-10-CM

## 2022-09-12 DIAGNOSIS — Z9889 Other specified postprocedural states: Secondary | ICD-10-CM

## 2022-09-12 DIAGNOSIS — O09521 Supervision of elderly multigravida, first trimester: Secondary | ICD-10-CM | POA: Diagnosis not present

## 2022-09-12 DIAGNOSIS — O0991 Supervision of high risk pregnancy, unspecified, first trimester: Secondary | ICD-10-CM

## 2022-09-12 DIAGNOSIS — Z8759 Personal history of other complications of pregnancy, childbirth and the puerperium: Secondary | ICD-10-CM

## 2022-09-12 LAB — OB RESULTS CONSOLE GC/CHLAMYDIA: Neisseria Gonorrhea: NEGATIVE

## 2022-09-12 MED ORDER — ASPIRIN 81 MG PO CHEW: 162.0000 mg | CHEWABLE_TABLET | Freq: Every day | ORAL | 7 refills | Status: DC

## 2022-09-12 NOTE — Progress Notes (Signed)
INITIAL OBSTETRICAL VISIT Patient name: Rebecca Webster MRN [id number]  Date of birth: [birth year] Chief Complaint:   Initial Prenatal Visit  History of Present Illness:   Rebecca Webster is a 42 y.o. [id number] African-American female at [redacted]w[redacted]d by LMP c/w u/s at 8.4 weeks with an Estimated Date of Delivery: 03/28/23 being seen today for her initial obstetrical visit.   Patient's last menstrual period was 06/21/2022 (exact date). Her obstetrical history is significant for  term vag x 2, C/S 33wk twins, term VBAC, 20wk PPROM (rescue cerclage had been placed); 36wk VBAC (IOL for gHTN), SAB x 2 .   Today she reports constipation.  Last pap Oct 2023. Results were: NILM w/ HRHPV negative     09/12/2022    1:42 PM 04/27/2022    8:41 AM  Depression screen PHQ 2/9  Decreased Interest 0 0  Down, Depressed, Hopeless 0 0  PHQ - 2 Score 0 0  Altered sleeping 2   Tired, decreased energy 1   Change in appetite 0   Feeling bad or failure about yourself  0   Trouble concentrating 0   Moving slowly or fidgety/restless 0   Suicidal thoughts 0   PHQ-9 Score 3         09/12/2022    1:43 PM  GAD 7 : Generalized Anxiety Score  Nervous, Anxious, on Edge 1  Control/stop worrying 0  Worry too much - different things 0  Trouble relaxing 0  Restless 0  Easily annoyed or irritable 0  Afraid - awful might happen 0  Total GAD 7 Score 1     Review of Systems:   Pertinent items are noted in HPI Denies cramping/contractions, leakage of fluid, vaginal bleeding, abnormal vaginal discharge w/ itching/odor/irritation, headaches, visual changes, shortness of breath, chest pain, abdominal pain, severe nausea/vomiting, or problems with urination or bowel movements unless otherwise stated above.  Pertinent History Reviewed:  Reviewed past medical,surgical, social, obstetrical and family history.  Reviewed problem list, medications and allergies. OB History  Gravida Para Term Preterm AB Living  10 5 3 2 4 5    SAB IAB Ectopic Multiple Live Births  3   1 1 5     # Outcome Date GA Lbr Len/2nd Weight Sex Delivery Anes PTL Lv  10 Current           9 SAB 04/2022          8 SAB 03/04/21 [redacted]w[redacted]d         7 Preterm 03/02/20 108w0d  6 lb 9 oz (2.977 kg) F Vag-Spont  N FD     Birth Comments: cerglage placed     Complications: Gestational hypertension  6 SAB 05/23/19 [redacted]w[redacted]d   M   N      Birth Comments: incompetent cervix     Complications: Preterm premature rupture of membranes  5 Ectopic 2019     ECTOPIC     4 Term 09/11/03 [redacted]w[redacted]d  6 lb 15 oz (3.147 kg) M VBAC EPI N LIV  3A Preterm 07/18/00 [redacted]w[redacted]d  3 lb 15 oz (1.786 kg) F CS-LTranv Spinal Y LIV     Complications: Breech presentation  3B Preterm 07/18/00 [redacted]w[redacted]d  4 lb 5 oz (1.956 kg) M CS-LTranv Spinal Y LIV     Complications: Breech presentation  2 Term 07/06/98 [redacted]w[redacted]d  8 lb (3.629 kg) F Vag-Spont EPI N LIV  1 Term 05/11/96 [redacted]w[redacted]d  8 lb 5 oz (3.771 kg) M Vag-Spont EPI N LIV  Physical Assessment:   Vitals:   09/12/22 1344  BP: 126/78  Pulse: 86  Weight: 216 lb 9.6 oz (98.2 kg)  Body mass index is 31.08 kg/m.       Physical Examination:  General appearance - well appearing, and in no distress  Mental status - alert, oriented to person, place, and time  Psych:  She has a normal mood and affect  Skin - warm and dry, normal color, no suspicious lesions noted  Chest - effort normal, all lung fields clear to auscultation bilaterally  Heart - normal rate and regular rhythm  Abdomen - soft, nontender  Extremities:  No swelling or varicosities noted  Pelvic - not indicated  Thin prep pap is not done    TODAY'S NT [place] 11+6 wks,measurements c/w dates,FHR 157 bpm,normal ovaries,NT 1.4 mm,NB present,CRL 58.90 mm   No results found for this or any previous visit (from the past 24 hour(s)).  Assessment & Plan:  1) High-Risk Pregnancy [id number] at [redacted]w[redacted]d with an Estimated Date of Delivery: 03/28/23   2) Initial OB visit  3) Hx incomp cx, in 2020 w G6 had  rescue cerclage at 18wks the PPROM @20wks ; had a prophylactic cerclage with G7 and 36wk delivery due to severe BPs; cx closed today; [facility] to schedule procedure  4) Hx preterm 33wk C/S w twins, followed by VBAC; will most likely plan for VBAC again  5) Hx gHTN, baseline labs; bASA sent in, but she isn't sure if she will take it or not due to bleeding concerns  6) AMA, growth at 32 and 36wks, 2x/wk testing @ 36wks  Meds:  Meds ordered this encounter  Medications   aspirin 81 MG chewable tablet    Sig: Chew 2 tablets (162 mg total) by mouth daily.    Dispense:  60 tablet    Refill:  7    Order Specific Question:   Supervising Provider    Answer:   Janyth Pupa [[id number]]    Initial labs obtained Continue prenatal vitamins Reviewed n/v relief measures and warning s/s to report Reviewed recommended weight gain based on pre-gravid BMI Encouraged well-balanced diet Genetic & carrier screening discussed: requests Panorama and NT/IT, declines Horizon  Ultrasound discussed; fetal survey: requested [place] completed> form faxed if has or is planning to apply for medicaid The nature of [place] for [employer] with multiple MDs and other Advanced Practice Providers was explained to patient; also emphasized that fellows, residents, and students are part of our team. Does have home bp cuff. Office bp cuff given: no. Rx sent: n/a. Check bp weekly, let us know if consistently >140/90.   Indications for ASA therapy (per uptodate) One of the following: H/O preeclampsia, especially early onset/adverse outcome Yes  Follow-up: Return in about 4 weeks (around 10/10/2022) for 4wk HROB w 2nd IT: 7wk HROB w anatomy u/s.   Orders Placed This Encounter  Procedures   Urine Culture   GC/Chlamydia Probe Amp   US OB Comp + 14 Wk   Integrated 1   Panorama Prenatal Test Full Panel   CBC/D/Plt+RPR+Rh+ABO+RubIgG...   Comp Met (CMET)   Protein / creatinine ratio, urine    Myrtis Ser  [facility] 09/12/2022 4:48 PM

## 2022-09-12 NOTE — Patient Instructions (Signed)
Rebecca Webster, thank you for choosing our office today! We appreciate the opportunity to meet your healthcare needs. You may receive a short survey by mail, e-mail, or through [employer]. If you are happy with your care we would appreciate if you could take just a few minutes to complete the survey questions. We read all of your comments and take your feedback very seriously. Thank you again for choosing our office.  [facility] at [facility] ([place], [place] [address]) Entrance C, located off of [address] parking   Nausea & Vomiting Have saltine crackers or pretzels by your bed and eat a few bites before you raise your head out of bed in the morning Eat small frequent meals throughout the day instead of large meals Drink plenty of fluids throughout the day to stay hydrated, just don't drink a lot of fluids with your meals.  This can make your stomach fill up faster making you feel sick Do not brush your teeth right after you eat Products with real ginger are good for nausea, like ginger ale and ginger hard candy Make sure it says made with real ginger! Sucking on sour candy like lemon heads is also good for nausea If your prenatal vitamins make you nauseated, take them at night so you will sleep through the nausea Sea Bands If you feel like you need medicine for the nausea & vomiting please let us know If you are unable to keep any fluids or food down please let us know   Constipation Drink plenty of fluid, preferably water, throughout the day Eat foods high in fiber such as fruits, vegetables, and grains Exercise, such as walking, is a good way to keep your bowels regular Drink warm fluids, especially warm prune juice, or decaf coffee Eat a 1/2 cup of real oatmeal (not instant), 1/2 cup applesauce, and 1/2-1 cup warm prune juice every day If needed, you may take Colace (docusate sodium) stool softener  once or twice a day to help keep the stool soft.  If you still are having problems with constipation, you may take Miralax once daily as needed to help keep your bowels regular.   Home Blood Pressure Monitoring for Patients   Your provider has recommended that you check your blood pressure (BP) at least once a week at home. If you do not have a blood pressure cuff at home, one will be provided for you. Contact your provider if you have not received your monitor within 1 week.   Helpful Tips for Accurate Home Blood Pressure Checks  Don't smoke, exercise, or drink caffeine 30 minutes before checking your BP Use the restroom before checking your BP (a full bladder can raise your pressure) Relax in a comfortable upright chair Feet on the ground Left arm resting comfortably on a flat surface at the level of your heart Legs uncrossed Back supported Sit quietly and don't talk Place the cuff on your bare arm Adjust snuggly, so that only two fingertips can fit between your skin and the top of the cuff Check 2 readings separated by at least one minute Keep a log of your BP readings For a visual, please reference this diagram: http://ccnc.care/bpdiagram  Provider Name: [facility]     Phone: [phone]  Zone 1: ALL CLEAR  Continue to monitor your symptoms:  BP reading is less than 140 (top number) or less than 90 (bottom  number)  No right upper stomach pain No headaches or seeing spots No feeling nauseated or throwing up No swelling in face and hands  Zone 2: CAUTION Call your doctor's office for any of the following:  BP reading is greater than 140 (top number) or greater than 90 (bottom number)  Stomach pain under your ribs in the middle or right side Headaches or seeing spots Feeling nauseated or throwing up Swelling in face and hands  Zone 3: EMERGENCY  Seek immediate medical care if you have any of the following:  BP reading is greater than160 (top number) or greater than  110 (bottom number) Severe headaches not improving with Tylenol Serious difficulty catching your breath Any worsening symptoms from Zone 2    First Trimester of Pregnancy The first trimester of pregnancy is from week 1 until the end of week 12 (months 1 through 3). A week after a sperm fertilizes an egg, the egg will implant on the wall of the uterus. This embryo will begin to develop into a baby. Genes from you and your partner are forming the baby. The female genes determine whether the baby is a boy or a girl. At 6-8 weeks, the eyes and face are formed, and the heartbeat can be seen on ultrasound. At the end of 12 weeks, all the baby's organs are formed.  Now that you are pregnant, you will want to do everything you can to have a healthy baby. Two of the most important things are to get good prenatal care and to follow your health care provider's instructions. Prenatal care is all the medical care you receive before the baby's birth. This care will help prevent, find, and treat any problems during the pregnancy and childbirth. BODY CHANGES Your body goes through many changes during pregnancy. The changes vary from woman to woman.  You may gain or lose a couple of pounds at first. You may feel sick to your stomach (nauseous) and throw up (vomit). If the vomiting is uncontrollable, call your health care provider. You may tire easily. You may develop headaches that can be relieved by medicines approved by your health care provider. You may urinate more often. Painful urination may mean you have a bladder infection. You may develop heartburn as a result of your pregnancy. You may develop constipation because certain hormones are causing the muscles that push waste through your intestines to slow down. You may develop hemorrhoids or swollen, bulging veins (varicose veins). Your breasts may begin to grow larger and become tender. Your nipples may stick out more, and the tissue that surrounds them  (areola) may become darker. Your gums may bleed and may be sensitive to brushing and flossing. Dark spots or blotches (chloasma, mask of pregnancy) may develop on your face. This will likely fade after the baby is born. Your menstrual periods will stop. You may have a loss of appetite. You may develop cravings for certain kinds of food. You may have changes in your emotions from day to day, such as being excited to be pregnant or being concerned that something may go wrong with the pregnancy and baby. You may have more vivid and strange dreams. You may have changes in your hair. These can include thickening of your hair, rapid growth, and changes in texture. Some women also have hair loss during or after pregnancy, or hair that feels dry or thin. Your hair will most likely return to normal after your baby is born. WHAT TO EXPECT AT YOUR PRENATAL  VISITS During a routine prenatal visit: You will be weighed to make sure you and the baby are growing normally. Your blood pressure will be taken. Your abdomen will be measured to track your baby's growth. The fetal heartbeat will be listened to starting around week 10 or 12 of your pregnancy. Test results from any previous visits will be discussed. Your health care provider may ask you: How you are feeling. If you are feeling the baby move. If you have had any abnormal symptoms, such as leaking fluid, bleeding, severe headaches, or abdominal cramping. If you have any questions. Other tests that may be performed during your first trimester include: Blood tests to find your blood type and to check for the presence of any previous infections. They will also be used to check for low iron levels (anemia) and Rh antibodies. Later in the pregnancy, blood tests for diabetes will be done along with other tests if problems develop. Urine tests to check for infections, diabetes, or protein in the urine. An ultrasound to confirm the proper growth and development  of the baby. An amniocentesis to check for possible genetic problems. Fetal screens for spina bifida and Down syndrome. You may need other tests to make sure you and the baby are doing well. HOME CARE INSTRUCTIONS  Medicines Follow your health care provider's instructions regarding medicine use. Specific medicines may be either safe or unsafe to take during pregnancy. Take your prenatal vitamins as directed. If you develop constipation, try taking a stool softener if your health care provider approves. Diet Eat regular, well-balanced meals. Choose a variety of foods, such as meat or vegetable-based protein, fish, milk and low-fat dairy products, vegetables, fruits, and whole grain breads and cereals. Your health care provider will help you determine the amount of weight gain that is right for you. Avoid raw meat and uncooked cheese. These carry germs that can cause birth defects in the baby. Eating four or five small meals rather than three large meals a day may help relieve nausea and vomiting. If you start to feel nauseous, eating a few soda crackers can be helpful. Drinking liquids between meals instead of during meals also seems to help nausea and vomiting. If you develop constipation, eat more high-fiber foods, such as fresh vegetables or fruit and whole grains. Drink enough fluids to keep your urine clear or pale yellow. Activity and Exercise Exercise only as directed by your health care provider. Exercising will help you: Control your weight. Stay in shape. Be prepared for labor and delivery. Experiencing pain or cramping in the lower abdomen or low back is a good sign that you should stop exercising. Check with your health care provider before continuing normal exercises. Try to avoid standing for long periods of time. Move your legs often if you must stand in one place for a long time. Avoid heavy lifting. Wear low-heeled shoes, and practice good posture. You may continue to have sex  unless your health care provider directs you otherwise. Relief of Pain or Discomfort Wear a good support bra for breast tenderness.   Take warm sitz baths to soothe any pain or discomfort caused by hemorrhoids. Use hemorrhoid cream if your health care provider approves.   Rest with your legs elevated if you have leg cramps or low back pain. If you develop varicose veins in your legs, wear support hose. Elevate your feet for 15 minutes, 3-4 times a day. Limit salt in your diet. Prenatal Care Schedule your prenatal visits by the  twelfth week of pregnancy. They are usually scheduled monthly at first, then more often in the last 2 months before delivery. Write down your questions. Take them to your prenatal visits. Keep all your prenatal visits as directed by your health care provider. Safety Wear your seat belt at all times when driving. Make a list of emergency phone numbers, including numbers for family, friends, the hospital, and police and fire departments. General Tips Ask your health care provider for a referral to a local prenatal education class. Begin classes no later than at the beginning of month 6 of your pregnancy. Ask for help if you have counseling or nutritional needs during pregnancy. Your health care provider can offer advice or refer you to specialists for help with various needs. Do not use hot tubs, steam rooms, or saunas. Do not douche or use tampons or scented sanitary pads. Do not cross your legs for long periods of time. Avoid cat litter boxes and soil used by cats. These carry germs that can cause birth defects in the baby and possibly loss of the fetus by miscarriage or stillbirth. Avoid all smoking, herbs, alcohol, and medicines not prescribed by your health care provider. Chemicals in these affect the formation and growth of the baby. Schedule a dentist appointment. At home, brush your teeth with a soft toothbrush and be gentle when you floss. SEEK MEDICAL CARE IF:   You have dizziness. You have mild pelvic cramps, pelvic pressure, or nagging pain in the abdominal area. You have persistent nausea, vomiting, or diarrhea. You have a bad smelling vaginal discharge. You have pain with urination. You notice increased swelling in your face, hands, legs, or ankles. SEEK IMMEDIATE MEDICAL CARE IF:  You have a fever. You are leaking fluid from your vagina. You have spotting or bleeding from your vagina. You have severe abdominal cramping or pain. You have rapid weight gain or loss. You vomit blood or material that looks like coffee grounds. You are exposed to [place] measles and have never had them. You are exposed to fifth disease or chickenpox. You develop a severe headache. You have shortness of breath. You have any kind of trauma, such as from a fall or a car accident. Document Released: 07/17/2001 Document Revised: 12/07/2013 Document Reviewed: 06/02/2013 [facility] Patient Information 2015 [place], [place]. This information is not intended to replace advice given to you by your health care provider. Make sure you discuss any questions you have with your health care provider.

## 2022-09-12 NOTE — Progress Notes (Signed)
[place] 11+6 wks,measurements c/w dates,FHR 157 bpm,normal ovaries,NT 1.4 mm,NB present,CRL 58.90 mm

## 2022-09-13 LAB — INTEGRATED 1

## 2022-09-14 LAB — INTEGRATED 1
Crown Rump Length: 58.9 mm
Gest. Age on Collection Date: 12.3 weeks
Maternal Age at EDD: 41.8 yr
Nuchal Translucency (NT): 1.4 mm
Number of Fetuses: 1
PAPP-A Value: 615.6 ng/mL
Weight: 217 [lb_av]

## 2022-09-14 LAB — HCV INTERPRETATION

## 2022-09-14 LAB — CBC/D/PLT+RPR+RH+ABO+RUBIGG...
Antibody Screen: NEGATIVE
Basophils Absolute: 0 10*3/uL (ref 0.0–0.2)
Basos: 0 %
EOS (ABSOLUTE): 0.1 10*3/uL (ref 0.0–0.4)
Eos: 2 %
HCV Ab: NONREACTIVE
HIV Screen 4th Generation wRfx: NONREACTIVE
Hematocrit: 38.7 % (ref 34.0–46.6)
Hemoglobin: 12 g/dL (ref 11.1–15.9)
Hepatitis B Surface Ag: NEGATIVE
Immature Grans (Abs): 0 10*3/uL (ref 0.0–0.1)
Immature Granulocytes: 1 %
Lymphocytes Absolute: 1.7 10*3/uL (ref 0.7–3.1)
Lymphs: 31 %
MCH: 22.4 pg — ABNORMAL LOW (ref 26.6–33.0)
MCHC: 31 g/dL — ABNORMAL LOW (ref 31.5–35.7)
MCV: 72 fL — ABNORMAL LOW (ref 79–97)
Monocytes Absolute: 0.4 10*3/uL (ref 0.1–0.9)
Monocytes: 8 %
Neutrophils Absolute: 3.1 10*3/uL (ref 1.4–7.0)
Neutrophils: 58 %
Platelets: 242 10*3/uL (ref 150–450)
RBC: 5.35 x10E6/uL — ABNORMAL HIGH (ref 3.77–5.28)
RDW: 15.8 % — ABNORMAL HIGH (ref 11.7–15.4)
RPR Ser Ql: NONREACTIVE
Rh Factor: POSITIVE
Rubella Antibodies, IGG: 1.67 index (ref >0.99)
WBC: 5.4 10*3/uL (ref 3.4–10.8)

## 2022-09-14 LAB — PROTEIN / CREATININE RATIO, URINE
Creatinine, Urine: 154.8 mg/dL
Protein, Ur: 11.1 mg/dL
Protein/Creat Ratio: 72 mg/g creat (ref 0–200)

## 2022-09-14 LAB — COMPREHENSIVE METABOLIC PANEL
ALT: 15 IU/L (ref 0–32)
AST: 12 IU/L (ref 0–40)
Albumin/Globulin Ratio: 1.8 (ref 1.2–2.2)
Albumin: 4.3 g/dL (ref 3.9–4.9)
Alkaline Phosphatase: 70 IU/L (ref 44–121)
BUN/Creatinine Ratio: 16 (ref 9–23)
BUN: 9 mg/dL (ref 6–24)
Bilirubin Total: <0.2 mg/dL (ref 0.0–1.2)
CO2: 22 mmol/L (ref 20–29)
Calcium: 9.7 mg/dL (ref 8.7–10.2)
Chloride: 102 mmol/L (ref 96–106)
Creatinine, Ser: 0.58 mg/dL (ref 0.57–1.00)
Globulin, Total: 2.4 g/dL (ref 1.5–4.5)
Glucose: 74 mg/dL (ref 70–99)
Potassium: 3.6 mmol/L (ref 3.5–5.2)
Sodium: 138 mmol/L (ref 134–144)
Total Protein: 6.7 g/dL (ref 6.0–8.5)
eGFR: 117 mL/min/{1.73_m2} (ref >59)

## 2022-09-15 LAB — URINE CULTURE: Organism ID, Bacteria: NO GROWTH

## 2022-09-17 LAB — GC/CHLAMYDIA PROBE AMP
Chlamydia trachomatis, NAA: NEGATIVE
Neisseria Gonorrhoeae by PCR: NEGATIVE

## 2022-09-19 LAB — PANORAMA PRENATAL TEST FULL PANEL:PANORAMA TEST PLUS 5 ADDITIONAL MICRODELETIONS: FETAL FRACTION: 5.2

## 2022-09-20 ENCOUNTER — Encounter: Payer: Self-pay | Admitting: Obstetrics & Gynecology

## 2022-09-20 ENCOUNTER — Ambulatory Visit (INDEPENDENT_AMBULATORY_CARE_PROVIDER_SITE_OTHER): Payer: Medicaid Other | Admitting: Obstetrics & Gynecology

## 2022-09-20 VITALS — BP 126/84 | HR 84 | Wt 217.4 lb

## 2022-09-20 DIAGNOSIS — Z9889 Other specified postprocedural states: Secondary | ICD-10-CM

## 2022-09-20 DIAGNOSIS — O3431 Maternal care for cervical incompetence, first trimester: Secondary | ICD-10-CM

## 2022-09-20 DIAGNOSIS — O09522 Supervision of elderly multigravida, second trimester: Secondary | ICD-10-CM

## 2022-09-20 DIAGNOSIS — O0991 Supervision of high risk pregnancy, unspecified, first trimester: Secondary | ICD-10-CM

## 2022-09-20 DIAGNOSIS — O0992 Supervision of high risk pregnancy, unspecified, second trimester: Secondary | ICD-10-CM

## 2022-09-20 DIAGNOSIS — Z3A13 13 weeks gestation of pregnancy: Secondary | ICD-10-CM

## 2022-09-20 DIAGNOSIS — O3432 Maternal care for cervical incompetence, second trimester: Secondary | ICD-10-CM

## 2022-09-20 DIAGNOSIS — O09521 Supervision of elderly multigravida, first trimester: Secondary | ICD-10-CM

## 2022-09-20 DIAGNOSIS — Z8759 Personal history of other complications of pregnancy, childbirth and the puerperium: Secondary | ICD-10-CM

## 2022-09-20 NOTE — Progress Notes (Signed)
[facility] PREGNANCY VISIT Patient name: JILLANE LEMASTERS MRN [id number]  Date of birth: June 03, 1981 Chief Complaint:   discuss cerclage  History of Present Illness:   YARROW REBELLO is a 42 y.o. [id number] female at 56w0dwith an Estimated Date of Delivery: 03/28/23 being seen today for ongoing management of a high-risk pregnancy complicated by:  -cervical insuff- h/o cerclage x2 (initial cerclage rescue) -AMA -h/o preterm delivery -prior C-section with VBAC  Today she reports no complaints.   Contractions: Not present. Vag. Bleeding: None.  Not yet feeling fetal movement . denies leaking of fluid.      09/12/2022    1:42 PM 04/27/2022    8:41 AM  Depression screen PHQ 2/9  Decreased Interest 0 0  Down, Depressed, Hopeless 0 0  PHQ - 2 Score 0 0  Altered sleeping 2   Tired, decreased energy 1   Change in appetite 0   Feeling bad or failure about yourself  0   Trouble concentrating 0   Moving slowly or fidgety/restless 0   Suicidal thoughts 0   PHQ-9 Score 3      Current Outpatient Medications  Medication Instructions   Ascorbic Acid (VITAMIN C PO) 1 tablet, Oral, 2 times weekly   aspirin 162 mg, Oral, Daily   docusate sodium (COLACE) 100 mg, Oral, Every morning   Prenat-Fe Poly-Methfol-FA-DHA (VITAFOL ULTRA) 29-0.6-0.4-200 MG CAPS 1 capsule, Oral, Daily   Probiotic Product (PROBIOTIC PO) 1 capsule, Oral, Every morning, [employer] Dr. FGala LewandowskyWomen's Probiotics      Review of Systems:   Pertinent items are noted in HPI Denies abnormal vaginal discharge w/ itching/odor/irritation, headaches, visual changes, shortness of breath, chest pain, abdominal pain, severe nausea/vomiting, or problems with urination or bowel movements unless otherwise stated above. Pertinent History Reviewed:  Reviewed past medical,surgical, social, obstetrical and family history.  Reviewed problem list, medications and allergies. Physical Assessment:   Vitals:   09/20/22 0939  BP:  126/84  Pulse: 84  Weight: 217 lb 6.4 oz (98.6 kg)  Body mass index is 31.19 kg/m.           Physical Examination:   General appearance: alert, well appearing, and in no distress  Mental status: normal mood, behavior, speech, dress, motor activity, and thought processes  Skin: warm & dry   Extremities: Edema: None    Cardiovascular: normal heart rate noted  Respiratory: normal respiratory effort, no distress  Abdomen: gravid, soft, non-tender  Pelvic:  sterile speculum placed- cervix appears closed with at least 3cm cervical length          Fetal Status:      FHT by bedside [place] 150bpm    Chaperone:  pt declined     No results found for this or any previous visit (from the past 24 hour(s)).   Assessment & Plan:  High-risk pregnancy: [id number] 160w0dith an Estimated Date of Delivery: 03/28/23   1) Cervical insufficiency with h/o cerclage -plan to schedule for cerclage at next available -reviewed risk/benefit including but not limited to risk of bleeding, infection, PPROM or preterm labor -questions/concerns were addressed and she desires to proceed at next available  []$  consideration for vaginal progesterone due to prior preterm birth @ [id number] -prior C_section  Start ASA daily Follow up in 1-2wk s/p cerclage  Meds: No orders of the defined types were placed in this encounter.   Labs/procedures today: none  Treatment Plan:  as outlined above  Reviewed: Preterm labor symptoms  and general obstetric precautions including but not limited to vaginal bleeding, contractions, leaking of fluid and fetal movement were reviewed in detail with the patient.  All questions were answered. Pt has home bp cuff. Check bp weekly, let us know if >140/90.   Follow-up: Return in about 1 week (around 09/27/2022) for Gary visit MD only (1-2wk).   Future Appointments  Date Time Provider [place]  09/28/2022 11:50 AM Florian Buff, MD [facility]  10/10/2022  8:50 AM Myrtis Ser, CNM [facility]  10/31/2022 10:00 AM [facility] [place] [facility] None  10/31/2022 10:50 AM Janyth Pupa, DO [facility]    No orders of the defined types were placed in this encounter.   Janyth Pupa, DO Attending [place], [facility] for [employer], [place]

## 2022-09-21 ENCOUNTER — Encounter (HOSPITAL_COMMUNITY): Payer: Self-pay | Admitting: *Deleted

## 2022-09-21 ENCOUNTER — Telehealth (HOSPITAL_COMMUNITY): Payer: Self-pay | Admitting: *Deleted

## 2022-09-21 NOTE — Telephone Encounter (Signed)
NPO p MN no meds arrive at 0700. Answered all questions

## 2022-09-21 NOTE — Anesthesia Preprocedure Evaluation (Signed)
Anesthesia Evaluation  Patient identified by MRN, date of birth, ID band Patient awake    Reviewed: Allergy & Precautions, NPO status , Patient's Chart, lab work & pertinent test results  Airway Mallampati: II  TM Distance: >3 FB Neck ROM: Full    Dental no notable dental hx.    Pulmonary neg pulmonary ROS   Pulmonary exam normal breath sounds clear to auscultation       Cardiovascular hypertension (gHTN, no meds), Normal cardiovascular exam Rhythm:Regular Rate:Normal     Neuro/Psych negative neurological ROS  negative psych ROS   GI/Hepatic negative GI ROS, Neg liver ROS,,,  Endo/Other    Morbid obesityBMI 41  Renal/GU negative Renal ROS  negative genitourinary   Musculoskeletal negative musculoskeletal ROS (+)    Abdominal   Peds negative pediatric ROS (+)  Hematology negative hematology ROS (+) Hb 12, plt 242   Anesthesia Other Findings   Reproductive/Obstetrics (+) Pregnancy                             Anesthesia Physical Anesthesia Plan  ASA: 2  Anesthesia Plan: Spinal   Post-op Pain Management: Ofirmev IV (intra-op)*   Induction:   PONV Risk Score and Plan: 2 and Propofol infusion and TIVA  Airway Management Planned: Natural Airway and Nasal Cannula  Additional Equipment: None  Intra-op Plan:   Post-operative Plan:   Informed Consent: I have reviewed the patients History and Physical, chart, labs and discussed the procedure including the risks, benefits and alternatives for the proposed anesthesia with the patient or authorized representative who has indicated his/her understanding and acceptance.       Plan Discussed with: CRNA  Anesthesia Plan Comments:         Anesthesia Quick Evaluation

## 2022-09-22 ENCOUNTER — Encounter (HOSPITAL_COMMUNITY)
Admission: RE | Disposition: A | Discharge status: Home / Self Care | Payer: Self-pay | Source: Home / Self Care | Attending: Obstetrics and Gynecology

## 2022-09-22 ENCOUNTER — Encounter (HOSPITAL_COMMUNITY): Payer: Self-pay | Admitting: Obstetrics and Gynecology

## 2022-09-22 ENCOUNTER — Observation Stay (HOSPITAL_COMMUNITY): Payer: Medicaid Other | Admitting: Anesthesiology

## 2022-09-22 ENCOUNTER — Other Ambulatory Visit: Payer: Self-pay

## 2022-09-22 ENCOUNTER — Observation Stay (HOSPITAL_COMMUNITY)
Admission: RE | Admit: 2022-09-22 | Discharge: 2022-09-22 | Disposition: A | Discharge status: Home / Self Care | Payer: Medicaid Other | Attending: Obstetrics and Gynecology | Admitting: Obstetrics and Gynecology

## 2022-09-22 ENCOUNTER — Observation Stay (HOSPITAL_BASED_OUTPATIENT_CLINIC_OR_DEPARTMENT_OTHER): Payer: Medicaid Other | Admitting: Anesthesiology

## 2022-09-22 DIAGNOSIS — O3432 Maternal care for cervical incompetence, second trimester: Secondary | ICD-10-CM

## 2022-09-22 DIAGNOSIS — O0991 Supervision of high risk pregnancy, unspecified, first trimester: Secondary | ICD-10-CM | POA: Insufficient documentation

## 2022-09-22 DIAGNOSIS — Z8751 Personal history of pre-term labor: Secondary | ICD-10-CM | POA: Diagnosis not present

## 2022-09-22 DIAGNOSIS — O131 Gestational [pregnancy-induced] hypertension without significant proteinuria, first trimester: Secondary | ICD-10-CM | POA: Insufficient documentation

## 2022-09-22 DIAGNOSIS — O3431 Maternal care for cervical incompetence, first trimester: Secondary | ICD-10-CM

## 2022-09-22 DIAGNOSIS — Z9889 Other specified postprocedural states: Secondary | ICD-10-CM

## 2022-09-22 DIAGNOSIS — Z3A12 12 weeks gestation of pregnancy: Secondary | ICD-10-CM | POA: Diagnosis not present

## 2022-09-22 DIAGNOSIS — Z3A13 13 weeks gestation of pregnancy: Secondary | ICD-10-CM | POA: Diagnosis not present

## 2022-09-22 DIAGNOSIS — Z8742 Personal history of other diseases of the female genital tract: Secondary | ICD-10-CM

## 2022-09-22 DIAGNOSIS — O34211 Maternal care for low transverse scar from previous cesarean delivery: Secondary | ICD-10-CM

## 2022-09-22 DIAGNOSIS — O132 Gestational [pregnancy-induced] hypertension without significant proteinuria, second trimester: Secondary | ICD-10-CM | POA: Diagnosis not present

## 2022-09-22 HISTORY — PX: CERVICAL CERCLAGE: SHX1329

## 2022-09-22 LAB — CBC
HCT: 35 % — ABNORMAL LOW (ref 36.0–46.0)
Hemoglobin: 11.4 g/dL — ABNORMAL LOW (ref 12.0–15.0)
MCH: 23.3 pg — ABNORMAL LOW (ref 26.0–34.0)
MCHC: 32.6 g/dL (ref 30.0–36.0)
MCV: 71.6 fL — ABNORMAL LOW (ref 80.0–100.0)
Platelets: 233 10*3/uL (ref 150–400)
RBC: 4.89 MIL/uL (ref 3.87–5.11)
RDW: 15.9 % — ABNORMAL HIGH (ref 11.5–15.5)
WBC: 5.4 10*3/uL (ref 4.0–10.5)
nRBC: 0 % (ref 0.0–0.2)

## 2022-09-22 SURGERY — CERCLAGE, CERVIX, VAGINAL APPROACH
Anesthesia: Spinal

## 2022-09-22 MED ORDER — ONDANSETRON HCL 4 MG/2ML IJ SOLN
INTRAMUSCULAR | Status: AC
  Filled 2022-09-22: qty 2

## 2022-09-22 MED ORDER — OXYCODONE HCL 5 MG PO TABS: 5.0000 mg | ORAL_TABLET | Freq: Once | ORAL | Status: DC | PRN

## 2022-09-22 MED ORDER — ONDANSETRON HCL 4 MG/2ML IJ SOLN
INTRAMUSCULAR | Status: DC | PRN
  Administered 2022-09-22: 4 mg via INTRAVENOUS

## 2022-09-22 MED ORDER — ONDANSETRON HCL 4 MG/2ML IJ SOLN: 4.0000 mg | Freq: Once | INTRAMUSCULAR | Status: DC | PRN

## 2022-09-22 MED ORDER — CHLOROPROCAINE HCL (PF) 3 % IJ SOLN
INTRAMUSCULAR | Status: DC | PRN
  Administered 2022-09-22: 1.6 mL

## 2022-09-22 MED ORDER — PHENAZOPYRIDINE HCL 200 MG PO TABS: 200.0000 mg | ORAL_TABLET | Freq: Three times a day (TID) | ORAL | 1 refills | Status: DC | PRN

## 2022-09-22 MED ORDER — DEXMEDETOMIDINE HCL IN NACL 80 MCG/20ML IV SOLN
INTRAVENOUS | Status: DC | PRN
  Administered 2022-09-22 (×2): 8 ug via BUCCAL

## 2022-09-22 MED ORDER — KETOROLAC TROMETHAMINE 30 MG/ML IJ SOLN: 30.0000 mg | Freq: Once | INTRAMUSCULAR | Status: DC | PRN

## 2022-09-22 MED ORDER — OXYCODONE HCL 5 MG/5ML PO SOLN
5.0000 mg | Freq: Once | ORAL | Status: DC | PRN
  Filled 2022-09-22: qty 5

## 2022-09-22 MED ORDER — SODIUM CHLORIDE 0.9 % IR SOLN
Status: DC | PRN
  Administered 2022-09-22: 1

## 2022-09-22 MED ORDER — ACETAMINOPHEN 325 MG PO TABS: 650.0000 mg | ORAL_TABLET | ORAL | Status: DC | PRN

## 2022-09-22 MED ORDER — ACETAMINOPHEN 325 MG PO TABS: 650.0000 mg | ORAL_TABLET | ORAL | 0 refills | Status: DC | PRN

## 2022-09-22 MED ORDER — DEXMEDETOMIDINE HCL IN NACL 80 MCG/20ML IV SOLN
INTRAVENOUS | Status: AC
  Filled 2022-09-22: qty 20

## 2022-09-22 MED ORDER — HYDROMORPHONE HCL 1 MG/ML IJ SOLN: 0.2500 mg | INTRAMUSCULAR | Status: DC | PRN

## 2022-09-22 MED ORDER — AMISULPRIDE (ANTIEMETIC) 5 MG/2ML IV SOLN: 10.0000 mg | Freq: Once | INTRAVENOUS | Status: DC | PRN

## 2022-09-22 MED ORDER — MEPERIDINE HCL 25 MG/ML IJ SOLN: 6.2500 mg | INTRAMUSCULAR | Status: DC | PRN

## 2022-09-22 MED ORDER — FENTANYL CITRATE (PF) 100 MCG/2ML IJ SOLN
INTRAMUSCULAR | Status: AC
  Filled 2022-09-22: qty 2

## 2022-09-22 MED ORDER — LACTATED RINGERS IV SOLN: INTRAVENOUS | Status: DC

## 2022-09-22 MED ORDER — STERILE WATER FOR IRRIGATION IR SOLN
Status: DC | PRN
  Administered 2022-09-22: 1

## 2022-09-22 MED ORDER — ACETAMINOPHEN 10 MG/ML IV SOLN
INTRAVENOUS | Status: AC
  Filled 2022-09-22: qty 100

## 2022-09-22 MED ORDER — CHLOROPROCAINE HCL (PF) 3 % IJ SOLN
INTRAMUSCULAR | Status: AC
  Filled 2022-09-22: qty 20

## 2022-09-22 MED ORDER — FENTANYL CITRATE (PF) 100 MCG/2ML IJ SOLN
INTRAMUSCULAR | Status: DC | PRN
  Administered 2022-09-22: 15 ug via INTRATHECAL

## 2022-09-22 MED ORDER — ONDANSETRON HCL 4 MG/2ML IJ SOLN: 4.0000 mg | Freq: Four times a day (QID) | INTRAMUSCULAR | Status: DC | PRN

## 2022-09-22 SURGICAL SUPPLY — 14 items
CANISTER SUCT 3000ML PPV (MISCELLANEOUS) IMPLANT
GLOVE BIO SURGEON STRL SZ 6.5 (GLOVE) ×1 IMPLANT
GLOVE BIOGEL PI IND STRL 7.0 (GLOVE) ×2 IMPLANT
GOWN STRL REUS W/TWL LRG LVL3 (GOWN DISPOSABLE) ×2 IMPLANT
NS IRRIG 1000ML POUR BTL (IV SOLUTION) ×1 IMPLANT
PACK VAGINAL MINOR WOMEN LF (CUSTOM PROCEDURE TRAY) ×1 IMPLANT
PAD OB MATERNITY 4.3X12.25 (PERSONAL CARE ITEMS) ×1 IMPLANT
PAD PREP 24X48 CUFFED NSTRL (MISCELLANEOUS) ×1 IMPLANT
SUT PROLENE 1 CT 1 30 (SUTURE) ×1 IMPLANT
SUT TICRON 2 BLUE 36 GS-21 (SUTURE) IMPLANT
TOWEL OR 17X24 6PK STRL BLUE (TOWEL DISPOSABLE) ×2 IMPLANT
TRAY FOLEY W/BAG SLVR 14FR (SET/KITS/TRAYS/PACK) ×1 IMPLANT
TUBING NON-CON 1/4 X 20 CONN (TUBING) IMPLANT
YANKAUER SUCT BULB TIP NO VENT (SUCTIONS) IMPLANT

## 2022-09-22 NOTE — Anesthesia Postprocedure Evaluation (Signed)
Anesthesia Post Note  Patient: Rebecca Webster  Procedure(s) Performed: CERCLAGE CERVICAL     Patient location during evaluation: PACU Anesthesia Type: Spinal Level of consciousness: awake and alert and oriented Pain management: pain level controlled Vital Signs Assessment: post-procedure vital signs reviewed and stable Respiratory status: spontaneous breathing, nonlabored ventilation and respiratory function stable Cardiovascular status: blood pressure returned to baseline and stable Postop Assessment: no headache, no backache, spinal receding, patient able to bend at knees and able to ambulate Anesthetic complications: no   No notable events documented.  Last Vitals:  Vitals:   09/22/22 1215 09/22/22 1241  BP: 118/74   Pulse:    Resp:    Temp:  36.8 C  SpO2:      Last Pain:  Vitals:   09/22/22 1241  TempSrc: Axillary  PainSc:    Pain Goal:                   Pervis Hocking

## 2022-09-22 NOTE — Discharge Summary (Addendum)
Gynecology Physician Postoperative Discharge Summary  Patient ID: Rebecca Webster MRN: [id number] DOB/AGE: 42-Jul-1982 42 y.o.  Admit Date: 09/22/2022 Discharge Date: 09/22/2022  Preoperative Diagnoses:  1. Single live intrauterine pregnancy at 13 weeks 2 days 2. History of exam indicated cerclage  Procedures: Procedure(s) (LRB): CERCLAGE CERVICAL (N/A)  Hospital Course:  Rebecca Webster is a 42 y.o. [id number]  admitted for scheduled surgery.  She underwent the procedures as mentioned above, her operation was uncomplicated. For further details about surgery, please refer to the operative report. Patient had an uncomplicated postoperative course and was discharged home on POD0.   Significant Labs:    Latest Ref Rng & Units 09/22/2022    8:24 AM 09/12/2022    4:19 PM 01/27/2022   10:34 PM  CBC  WBC 4.0 - 10.5 K/uL 5.4  5.4  5.9   Hemoglobin 12.0 - 15.0 g/dL 11.4  12.0  11.9   Hematocrit 36.0 - 46.0 % 35.0  38.7  37.8   Platelets 150 - 400 K/uL 233  242  228     Discharge Exam: Blood pressure 106/69, pulse 73, temperature 98.1 F (36.7 C), temperature source Oral, resp. rate 16, height 5' 1"$  (1.549 m), weight 98.4 kg, last menstrual period 06/21/2022, SpO2 100 %, unknown if currently breastfeeding. General appearance: alert and no distress  Resp: normal respiratory effort Cardio: regular rate and rhythm  GI: soft, non-tender   Discharged Condition: Stable  Disposition: Discharge disposition: 01-Home or Self Care      Discharge Instructions     Call MD for:  persistant nausea and vomiting   Complete by: As directed    Call MD for:  severe uncontrolled pain   Complete by: As directed    Call MD for:  temperature >100.4   Complete by: As directed    Diet - low sodium heart healthy   Complete by: As directed    Increase activity slowly   Complete by: As directed    Sexual Activity Restrictions   Complete by: As directed    Nothing in the vagina for 2 weeks       Allergies as of 09/22/2022   No Known Allergies      Medication List     TAKE these medications    acetaminophen 325 MG tablet Commonly known as: TYLENOL Take 2 tablets (650 mg total) by mouth every 4 (four) hours as needed for moderate pain.   aspirin 81 MG chewable tablet Chew 2 tablets (162 mg total) by mouth daily.   docusate sodium 100 MG capsule Commonly known as: COLACE Take 100 mg by mouth in the morning.   PROBIOTIC PO Take 1 capsule by mouth in the morning. [employer] Dr. Gala Lewandowsky Women's Probiotics   Vitafol Ultra 29-0.6-0.4-200 MG Caps Take 1 capsule by mouth daily.   VITAMIN C PO Take 1 tablet by mouth 2 (two) times a week.       Future Appointments  Date Time Provider [place]  09/28/2022 11:50 AM Florian Buff, MD [facility]  10/10/2022  8:50 AM Myrtis Ser, CNM [facility]  10/31/2022 10:00 AM [facility] [place] [facility] None  10/31/2022 10:50 AM Janyth Pupa, DO [facility]    Follow-up [place] for [place] at [facility] Follow up in 6 day(s).   Specialty: Obstetrics and Gynecology Why: Please keep your scheduled appointment with Dr. Florestine Avers information: [place]  [place] [phone]                Total discharge time: 20 minutes   Signed: Gale Journey, MD [place], [facility] for [facility], [place]

## 2022-09-22 NOTE — H&P (Signed)
FACULTY PRACTICE PRE OPERATIVE HISTORY AND PHYSICAL NOTE   History of Present Illness: Rebecca Webster is a 42 y.o. [id number] at 58w2dpresenting for history indicated cerclage.   Doing well, nervous for the procedure. Denies pain, bleeding, LOF.   Patient Active Problem List   Diagnosis Date Noted   AMA (advanced maternal age) multigravida 35+ 09/12/2022   Supervision of high-risk pregnancy 08/20/2022   History of vaginal delivery following previous cesarean delivery 08/20/2022   History of gestational hypertension 08/20/2022   History of preterm premature rupture of membranes 08/20/2022   History of cervical cerclage 09/29/2019    Past Medical History:  Diagnosis Date   Anemia    not currently anemic   Ectopic pregnancy    Gestational HTN    Incompetent cervix    Miscarriage     Past Surgical History:  Procedure Laterality Date   abdominalplasty     CERVICAL CERCLAGE N/A 05/06/2019   Procedure: CERCLAGE CERVICAL;  Surgeon: CServando Salina MD;  Location: [facility] LD ORS;  Service: Gynecology;  Laterality: N/A;   CERVICAL CERCLAGE N/A 09/29/2019   Procedure: CERCLAGE CERVICAL;  Surgeon: CServando Salina MD;  Location: [facility] LD ORS;  Service: Gynecology;  Laterality: N/A;  EDD: 03/28/20   CESAREAN SECTION     Reversal of tubal ligation     TUBAL LIGATION     tubalr eversal      OB History  Gravida Para Term Preterm AB Living  10 5 3 2 4 6  $ SAB IAB Ectopic Multiple Live Births  3   1 1 6    $ # Outcome Date GA Lbr Len/2nd Weight Sex Delivery Anes PTL Lv  10 Current           9 SAB 04/2022          8 SAB 03/04/21 167w0d       7 Preterm 03/02/20 361w0d977 g F Vag-Spont  N LIV     Birth Comments: cerglage placed     Complications: Gestational hypertension  6 SAB 05/23/19 20w72w5d   N      Birth Comments: incompetent cervix     Complications: Preterm premature rupture of membranes  5 Ectopic 2019     ECTOPIC     4 Term 09/11/03 40w028w0d7 g M VBAC EPI N LIV  3A  Preterm 07/18/00 86w0d80w0d g F CS-LTranv Spinal Y LIV     Complications: Breech presentation  3B Preterm 07/18/00 [redacted]w[redacted]d 57w0dg M CS-LTranv Spinal Y LIV     Complications: Breech presentation  2 Term 07/06/98 [redacted]w[redacted]d  [redacted]w[redacted]d F Vag-Spont EPI N LIV  1 Term 05/11/96 [redacted]w[redacted]d  3110w0dM Vag-Spont EPI N LIV    Social History   Socioeconomic History   Marital status: Married    Spouse name: Not on file   Number of children: Not on file   Years of education: Not on file   Highest education level: Not on file  Occupational History   Not on file  Tobacco Use   Smoking status: Never   Smokeless tobacco: Never  Vaping Use   Vaping Use: Never used  Substance and Sexual Activity   Alcohol use: No   Drug use: No   Sexual activity: Yes    Birth control/protection: None  Other Topics Concern   Not on file  Social History Narrative   Not on file   Social Determinants of Health  Financial Resource Strain: Not on file  Food Insecurity: Not on file  Transportation Needs: Not on file  Physical Activity: Not on file  Stress: Not on file  Social Connections: Not on file    Family History  Problem Relation Age of Onset   Heart disease Maternal Grandmother    Stroke Maternal Grandmother    Cancer Father        prostate    No Known Allergies  Medications Prior to Admission  Medication Sig Dispense Refill Last Dose   Ascorbic Acid (VITAMIN C PO) Take 1 tablet by mouth 2 (two) times a week.      docusate sodium (COLACE) 100 MG capsule Take 100 mg by mouth in the morning.      Prenat-Fe Poly-Methfol-FA-DHA (VITAFOL ULTRA) 29-0.6-0.4-200 MG CAPS Take 1 capsule by mouth daily. 30 capsule 12    Probiotic Product (PROBIOTIC PO) Take 1 capsule by mouth in the morning. [employer] Dr. Gala Lewandowsky Women's Probiotics      aspirin 81 MG chewable tablet Chew 2 tablets (162 mg total) by mouth daily. 60 tablet 7     Review of Systems - Negative except as noted in HPI  Vitals:  BP 133/84    Pulse 80   Temp 98.2 F (36.8 C) (Oral)   Resp 20   Ht 5' 1"$  (1.549 m)   Wt 98.4 kg   LMP 06/21/2022 (Exact Date)   BMI 41.00 kg/m  Physical Examination: CONSTITUTIONAL: Well-developed, well-nourished female in no acute distress.  HENT:  Normocephalic, atraumatic SKIN: Skin is warm and dry. No rash noted. Not diaphoretic. No erythema. No pallor. NEUROLOGIC: Alert and oriented to person, place, and time.  PSYCHIATRIC: Normal mood and affect. Normal behavior. Normal judgment and thought content. CARDIOVASCULAR: Normal heart rate noted, regular rhythm RESPIRATORY: Effort normal, no problems with respiration noted ABDOMEN: Soft, nontender, nondistended  Labs:  No results found for this or any previous visit (from the past 24 hour(s)).  Imaging Studies: No results found.  Assessment and Plan: Patient Active Problem List   Diagnosis Date Noted   AMA (advanced maternal age) multigravida 35+ 09/12/2022   Supervision of high-risk pregnancy 08/20/2022   History of vaginal delivery following previous cesarean delivery 08/20/2022   History of gestational hypertension 08/20/2022   History of preterm premature rupture of membranes 08/20/2022   History of cervical cerclage 09/29/2019   The patient was counseled on her option for a history indicated cerclage. We reviewed the risks of cerclage including pain after procedure, infection/chorioamnionitis, bleeding/hemorrhage, preterm contractions, PPROM, cerclage failure and injury to bladder or rectum. Discussed alternative of no cerclage and progesterone supplementation. Consents signed.  FHT pre and post procedure To OR for history indicated cervical cerclage.  Pre op abx or tocolysis is not indicated at this time. Anticipate discharge home post procedure  Gale Journey, MD [place], [facility], [facility]

## 2022-09-22 NOTE — Op Note (Signed)
Date of procedure: 09/22/22  Pre-procedure diagnosis: 1. Single live intrauterine pregnancy at 13 weeks 2 days 2. History of exam indicated cerclage  Post-procedure diagnosis: 1. As above 2. S/p history indicated McDonald cerclage  Procedure: McDonald Cerclage Anesthesia: Epidural Surgeon: Dr. Gale Journey Assistant: Dr. Simmie Davies. An experienced assistant was required given the standard of surgical care given the complexity of the case.  This assistant was needed for exposure, dissection, suctioning, retraction, instrument exchange, and for overall help during the procedure.  Findings: SCE prior to procedure closed & fingertip at external os with 3cm of cervical length. FHT 155 prior to surgery. SCE post procedure closed with 2.5cm of length in front of cerclage, knot at 12 o'clock with air knot midway to facilitate removal.  EBL: 5cc  IVF/UOP: per anesthesia documentation Complications: None  Indication for procedure: Pt presents to L&D for cervical cerclage placement for history of cerclage in two prior pregnancies (2020 exam indicated/rescue cerclage at 18 weeks that ultimately resulted in demise & 2021 history indicated cerclage with ultimately a VBAC at 36 weeks). FHT were obtained and normal by doppler prior to the procedure. We reviewed the risks of cerclage including pain after procedure, infection/chorioamnionitis, bleeding/hemorrhage, preterm contractions, PPROM, cerclage failure and injury to bladder or rectum. Discussed alternative of no cerclage and progesterone supplementation. Patient confirmed desired for cerclage placement. Informed consent was obtained. Spinal anesthesia was administered without complication.  Details of Procedure: After confirming adequate anesthesia, the patient was placed in dorsal lithotomy and prepped and draped in the usual fashion. The cervix was visualized and grasped with ringed forceps. The cervico-vaginal junction was noted. 2-Ticron  was placed from 12:00 to 9:30, 8:30 to 6:00, 6:00 to 3:30 and 2:30 to 12:00. The suture was placed on tension. Cervical exam performed and no suture palpated within the cervical os. Rectal exam was performed and confirmed no suture material present. The cerclage was then tied down. After 4 knots, an air knot was tied to facilitate removal. Additional 12 knots were thrown. The suture was then cut with a 2 cm tail. All instruments were removed. All instruments and sponge counts were reported to be correct.   The patient tolerated the procedure well and was moved to recovery in good condition.  Rh+ Anticipate discharge home from [place], MD [place], [facility] for [facility], [place]

## 2022-09-22 NOTE — Transfer of Care (Signed)
Immediate Anesthesia Transfer of Care Note  Patient: Rebecca Webster  Procedure(s) Performed: CERCLAGE CERVICAL  Patient Location: PACU  Anesthesia Type:Spinal  Level of Consciousness: awake, alert , and oriented  Airway & Oxygen Therapy: Patient Spontanous Breathing  Post-op Assessment: Report given to RN and Post -op Vital signs reviewed and stable  Post vital signs: Reviewed and stable  Last Vitals:  Vitals Value Taken Time  BP 106/69 09/22/22 1100  Temp    Pulse 74 09/22/22 1101  Resp 16 09/22/22 1101  SpO2 100 % 09/22/22 1101  Vitals shown include unvalidated device data.  Last Pain:  Vitals:   09/22/22 0800  TempSrc: Oral  PainSc:          Complications: No notable events documented.

## 2022-09-22 NOTE — Anesthesia Procedure Notes (Signed)
Spinal  Patient location during procedure: OR Start time: 09/22/2022 9:45 AM End time: 09/22/2022 9:50 AM Reason for block: surgical anesthesia Staffing Performed: anesthesiologist  Anesthesiologist: Pervis Hocking, DO Performed by: Pervis Hocking, DO Authorized by: Pervis Hocking, DO   Preanesthetic Checklist Completed: patient identified, IV checked, risks and benefits discussed, surgical consent, monitors and equipment checked, pre-op evaluation and timeout performed Spinal Block Patient position: sitting Prep: DuraPrep and site prepped and draped Patient monitoring: cardiac monitor, continuous pulse ox and blood pressure Approach: midline Location: L3-4 Injection technique: single-shot Needle Needle type: Pencan  Needle gauge: 24 G Needle length: 9 cm Assessment Sensory level: T6 Events: CSF return Additional Notes Functioning IV was confirmed and monitors were applied. Sterile prep and drape, including hand hygiene and sterile gloves were used. The patient was positioned and the spine was prepped. The skin was anesthetized with lidocaine.  Free flow of clear CSF was obtained prior to injecting local anesthetic into the CSF.  The spinal needle aspirated freely following injection.  The needle was carefully withdrawn.  The patient tolerated the procedure well.

## 2022-09-22 NOTE — OB Triage Note (Signed)
Discharge paperwork given

## 2022-09-23 ENCOUNTER — Encounter (HOSPITAL_COMMUNITY): Payer: Self-pay | Admitting: Obstetrics and Gynecology

## 2022-09-28 ENCOUNTER — Ambulatory Visit (INDEPENDENT_AMBULATORY_CARE_PROVIDER_SITE_OTHER): Payer: Medicaid Other | Admitting: Obstetrics & Gynecology

## 2022-09-28 ENCOUNTER — Encounter: Payer: Self-pay | Admitting: Obstetrics & Gynecology

## 2022-09-28 VITALS — BP 122/74 | HR 82 | Wt 220.0 lb

## 2022-09-28 DIAGNOSIS — O3432 Maternal care for cervical incompetence, second trimester: Secondary | ICD-10-CM

## 2022-09-28 DIAGNOSIS — O0992 Supervision of high risk pregnancy, unspecified, second trimester: Secondary | ICD-10-CM

## 2022-09-28 DIAGNOSIS — Z3A14 14 weeks gestation of pregnancy: Secondary | ICD-10-CM

## 2022-09-28 MED ORDER — PROGESTERONE 200 MG PO CAPS: 200.0000 mg | ORAL_CAPSULE | Freq: Every day | ORAL | 11 refills | Status: DC

## 2022-09-28 NOTE — Progress Notes (Signed)
HIGH-RISK PREGNANCY VISIT Patient name: Rebecca Webster MRN [id number]  Date of birth: 1981/08/03 Chief Complaint:   Routine Prenatal Visit (Vaginal irritation) and Routine Post Op  History of Present Illness:   Rebecca Webster is a 42 y.o. [id number] female at 9w1dwith an Estimated Date of Delivery: 03/28/23 being seen today for ongoing management of a high-risk pregnancy complicated by AMA, history of cervical incompetence-->McDonald cerclage in place.    Today she reports no complaints. Contractions: Not present. Vag. Bleeding: None.   . denies leaking of fluid.      09/12/2022    1:42 PM 04/27/2022    8:41 AM  Depression screen PHQ 2/9  Decreased Interest 0 0  Down, Depressed, Hopeless 0 0  PHQ - 2 Score 0 0  Altered sleeping 2   Tired, decreased energy 1   Change in appetite 0   Feeling bad or failure about yourself  0   Trouble concentrating 0   Moving slowly or fidgety/restless 0   Suicidal thoughts 0   PHQ-9 Score 3         09/12/2022    1:43 PM  GAD 7 : Generalized Anxiety Score  Nervous, Anxious, on Edge 1  Control/stop worrying 0  Worry too much - different things 0  Trouble relaxing 0  Restless 0  Easily annoyed or irritable 0  Afraid - awful might happen 0  Total GAD 7 Score 1     Review of Systems:   Pertinent items are noted in HPI Denies abnormal vaginal discharge w/ itching/odor/irritation, headaches, visual changes, shortness of breath, chest pain, abdominal pain, severe nausea/vomiting, or problems with urination or bowel movements unless otherwise stated above. Pertinent History Reviewed:  Reviewed past medical,surgical, social, obstetrical and family history.  Reviewed problem list, medications and allergies. Physical Assessment:   Vitals:   09/28/22 1206  BP: 122/74  Pulse: 82  Weight: 220 lb (99.8 kg)  Body mass index is 41.57 kg/m.           Physical Examination:   General appearance: alert, well appearing, and in no  distress  Mental status: alert, oriented to person, place, and time  Skin: warm & dry   Extremities: Edema: None    Cardiovascular: normal heart rate noted  Respiratory: normal respiratory effort, no distress  Abdomen: gravid, soft, non-tender  Pelvic: cerclage in place visually 2-3 cm long         Fetal Status:          Fetal Surveillance Testing today: FHR 160s   Chaperone: Tish Cresenzo  No results found for this or any previous visit (from the past 24 hour(s)).  Assessment & Plan:  High-risk pregnancy: [id number] 18w1dith an Estimated Date of Delivery: 03/28/23      ICD-10-CM   1. Supervision of high risk pregnancy in second trimester  O09.92     2. Incompetent cervix, McDonald cerclage in place  O34.32    continue prometrium 200 qhs        Meds:  Meds ordered this encounter  Medications   progesterone (PROMETRIUM) 200 MG capsule    Sig: Take 1 capsule (200 mg total) by mouth daily.    Dispense:  30 capsule    Refill:  11    Orders: No orders of the defined types were placed in this encounter.    Labs/procedures today: none  Treatment Plan:  q 2 weeks sonogram    Follow-up: Return in about 2 weeks (  around 10/12/2022) for HROB, cervical length.   Future Appointments  Date Time Provider [place]  10/10/2022  8:50 AM Myrtis Ser, CNM [facility]  10/31/2022 10:00 AM [facility] [place] [facility] None  10/31/2022 10:50 AM Janyth Pupa, DO [facility]    No orders of the defined types were placed in this encounter.  Florian Buff  Attending Physician for the [facility] [place] [facility] 09/28/2022 12:37 PM

## 2022-10-10 ENCOUNTER — Encounter: Payer: Medicaid Other | Admitting: Advanced Practice Midwife

## 2022-10-11 ENCOUNTER — Other Ambulatory Visit: Payer: Self-pay | Admitting: Obstetrics & Gynecology

## 2022-10-11 DIAGNOSIS — O3432 Maternal care for cervical incompetence, second trimester: Secondary | ICD-10-CM

## 2022-10-12 ENCOUNTER — Ambulatory Visit (INDEPENDENT_AMBULATORY_CARE_PROVIDER_SITE_OTHER): Payer: Medicaid Other | Admitting: Obstetrics and Gynecology

## 2022-10-12 ENCOUNTER — Encounter: Payer: Self-pay | Admitting: Obstetrics & Gynecology

## 2022-10-12 ENCOUNTER — Encounter: Payer: Self-pay | Admitting: Obstetrics and Gynecology

## 2022-10-12 ENCOUNTER — Ambulatory Visit (INDEPENDENT_AMBULATORY_CARE_PROVIDER_SITE_OTHER): Payer: Medicaid Other

## 2022-10-12 VITALS — BP 119/81 | HR 76 | Wt 222.0 lb

## 2022-10-12 DIAGNOSIS — O09522 Supervision of elderly multigravida, second trimester: Secondary | ICD-10-CM

## 2022-10-12 DIAGNOSIS — O0992 Supervision of high risk pregnancy, unspecified, second trimester: Secondary | ICD-10-CM | POA: Diagnosis not present

## 2022-10-12 DIAGNOSIS — Z3A16 16 weeks gestation of pregnancy: Secondary | ICD-10-CM

## 2022-10-12 DIAGNOSIS — Z1379 Encounter for other screening for genetic and chromosomal anomalies: Secondary | ICD-10-CM

## 2022-10-12 DIAGNOSIS — O3432 Maternal care for cervical incompetence, second trimester: Secondary | ICD-10-CM | POA: Diagnosis not present

## 2022-10-12 DIAGNOSIS — Z8759 Personal history of other complications of pregnancy, childbirth and the puerperium: Secondary | ICD-10-CM

## 2022-10-12 DIAGNOSIS — Z98891 History of uterine scar from previous surgery: Secondary | ICD-10-CM

## 2022-10-12 NOTE — Progress Notes (Signed)
Subjective:  Rebecca Webster is a 42 y.o. [id number] at 2w1dbeing seen today for ongoing prenatal care.  She is currently monitored for the following issues for this high-risk pregnancy and has Supervision of high-risk pregnancy; History of cervical cerclage; History of vaginal delivery following previous cesarean delivery; History of gestational hypertension; History of preterm premature rupture of membranes; AMA (advanced maternal age) multigravida 35+ and Cervical insufficiency during pregnancy in second trimester, antepartum on their problem list.  Patient reports  constipation .  Contractions: Not present. Vag. Bleeding: None.  Movement: Absent. Denies leaking of fluid.   The following portions of the patient's history were reviewed and updated as appropriate: allergies, current medications, past family history, past medical history, past social history, past surgical history and problem list. Problem list updated.  Objective:   Vitals:   10/12/22 1214  BP: 119/81  Pulse: 76  Weight: 222 lb (100.7 kg)    Fetal Status:     Movement: Absent     General:  Alert, oriented and cooperative. Patient is in no acute distress.  Skin: Skin is warm and dry. No rash noted.   Cardiovascular: Normal heart rate noted  Respiratory: Normal respiratory effort, no problems with respiration noted  Abdomen: Soft, gravid, appropriate for gestational age. Pain/Pressure: Absent     Pelvic:  Cervical exam deferred        Extremities: Normal range of motion.     Mental Status: Normal mood and affect. Normal behavior. Normal judgment and thought content.   Urinalysis:      Assessment and Plan:  Pregnancy: [id number] 125w1d1. Supervision of high risk pregnancy in second trimester Stable - INTEGRATED 2  2. Cervical insufficiency during pregnancy in second trimester, antepartum CL today Start Prometrium  3. History of gestational hypertension BP stable No S/Sx presently  4. History of preterm  premature rupture of membranes Stable  5. History of vaginal delivery following previous cesarean delivery Stable  6. Genetic screening  - INTEGRATED 2  Preterm labor symptoms and general obstetric precautions including but not limited to vaginal bleeding, contractions, leaking of fluid and fetal movement were reviewed in detail with the patient. Please refer to After Visit Summary for other counseling recommendations.  Return in about 4 weeks (around 11/09/2022) for OB visit, face to face, MD only.   ErChancy MilroyMD

## 2022-10-12 NOTE — Progress Notes (Signed)
US TV/TA CX length:16+1 wks,cx length w/o pressure 2.2-2.4 cm,cerclage to external OS 1.4 cm,cx with pressure 2 cm,amnionic sludge,anterior placenta,FHR 150 bpm,SVP of fluid 4.3 cm,normal ovaries

## 2022-10-14 ENCOUNTER — Inpatient Hospital Stay (HOSPITAL_COMMUNITY)
Admission: AD | Admit: 2022-10-14 | Discharge: 2022-10-14 | Disposition: A | Discharge status: Home / Self Care | Payer: Medicaid Other | Attending: Obstetrics & Gynecology | Admitting: Obstetrics & Gynecology

## 2022-10-14 ENCOUNTER — Encounter (HOSPITAL_COMMUNITY): Payer: Self-pay | Admitting: Obstetrics & Gynecology

## 2022-10-14 ENCOUNTER — Inpatient Hospital Stay (HOSPITAL_BASED_OUTPATIENT_CLINIC_OR_DEPARTMENT_OTHER): Payer: Medicaid Other

## 2022-10-14 DIAGNOSIS — O09522 Supervision of elderly multigravida, second trimester: Secondary | ICD-10-CM

## 2022-10-14 DIAGNOSIS — O26892 Other specified pregnancy related conditions, second trimester: Secondary | ICD-10-CM | POA: Insufficient documentation

## 2022-10-14 DIAGNOSIS — O09212 Supervision of pregnancy with history of pre-term labor, second trimester: Secondary | ICD-10-CM | POA: Diagnosis not present

## 2022-10-14 DIAGNOSIS — Z3A16 16 weeks gestation of pregnancy: Secondary | ICD-10-CM | POA: Insufficient documentation

## 2022-10-14 DIAGNOSIS — O0942 Supervision of pregnancy with grand multiparity, second trimester: Secondary | ICD-10-CM | POA: Insufficient documentation

## 2022-10-14 DIAGNOSIS — O3432 Maternal care for cervical incompetence, second trimester: Secondary | ICD-10-CM | POA: Diagnosis not present

## 2022-10-14 DIAGNOSIS — R102 Pelvic and perineal pain: Secondary | ICD-10-CM | POA: Diagnosis not present

## 2022-10-14 DIAGNOSIS — O09292 Supervision of pregnancy with other poor reproductive or obstetric history, second trimester: Secondary | ICD-10-CM | POA: Diagnosis not present

## 2022-10-14 LAB — WET PREP, GENITAL
Clue Cells Wet Prep HPF POC: NONE SEEN
Sperm: NONE SEEN
Trich, Wet Prep: NONE SEEN
WBC, Wet Prep HPF POC: >=10 — AB (ref <10)
Yeast Wet Prep HPF POC: NONE SEEN

## 2022-10-14 LAB — URINALYSIS, ROUTINE W REFLEX MICROSCOPIC
Bilirubin Urine: NEGATIVE
Glucose, UA: NEGATIVE mg/dL
Hgb urine dipstick: NEGATIVE
Ketones, ur: NEGATIVE mg/dL
Leukocytes,Ua: NEGATIVE
Nitrite: NEGATIVE
Protein, ur: NEGATIVE mg/dL
Specific Gravity, Urine: 1.003 — ABNORMAL LOW (ref 1.005–1.030)
pH: 6 (ref 5.0–8.0)

## 2022-10-14 MED ORDER — CEFADROXIL 500 MG PO CAPS: 500.0000 mg | ORAL_CAPSULE | Freq: Two times a day (BID) | ORAL | 0 refills | Status: AC

## 2022-10-14 NOTE — MAU Note (Signed)
Rebecca Webster is a 42 y.o. at 56w3dhere in MAU reporting: cerclage placed about 3 wks ago.   Went in for exam on Friday they did an internal.  Yesterday  started feeling some tightening in lower abd, still feeling that. Constant discomfort, but there is a change in intensity on top of it, like waves. some vag irritation since about a wk after procedure. No bleeding, some d/c- is using prometrium- vag suppositories, did notice some kind of chunky d/c prior to inserting the first day.  Onset of complaint: yesterday Pain score: 4/vag 3 Vitals:   10/14/22 1332  BP: 129/76  Pulse: 88  Resp: 17  Temp: 99.4 F (37.4 C)  SpO2: 100%     FHT:154 Lab orders placed from triage:  urine

## 2022-10-14 NOTE — Progress Notes (Signed)
Bladder scan showed 578 ml present in bladder Patient voided in [place] approx. 1 hour ago  Output 400 ml pale yellow urine collected in hat.

## 2022-10-14 NOTE — MAU Provider Note (Signed)
History     CSN: [id number]  Arrival date and time: 10/14/22 1242   Event Date/Time   First Provider Initiated Contact with Patient 10/14/22 1359      Chief Complaint  Patient presents with   Abdominal Pain   vaginal irritation   Rebecca Webster , a  42 y.o. [id number] at 72w3dpresents to MAU with complaints of abdominal pain and vaginal irritation. Patient states since her [place] Friday she has been feeling "pressure" like lower abdominal pain. She denies attempting to relieve symptoms and denies worsening or alleviating symptoms. She is currently rating pain a 4/10. She also states that she is having "slight burning" with urination. She states she had some white clumpy discharge prior to starting Progesterone inserts and noted some vaginal itching but states "its not all the time." She denies vaginal bleeding. She reports uncertain fetal movement at this time.  She denies nausea and vomiting, fever, chills.         OB History     Gravida  10   Para  5   Term  3   Preterm  2   AB  4   Living  6      SAB  3   IAB      Ectopic  1   Multiple  1   Live Births  6           Past Medical History:  Diagnosis Date   Anemia    not currently anemic   Ectopic pregnancy    Gestational HTN    Incompetent cervix    Miscarriage     Past Surgical History:  Procedure Laterality Date   abdominalplasty     CERVICAL CERCLAGE N/A 05/06/2019   Procedure: CERCLAGE CERVICAL;  Surgeon: CServando Salina MD;  Location: [facility];  Service: Gynecology;  Laterality: N/A;   CERVICAL CERCLAGE N/A 09/29/2019   Procedure: CERCLAGE CERVICAL;  Surgeon: CServando Salina MD;  Location: [facility];  Service: Gynecology;  Laterality: N/A;  EDD: 03/28/20   CERVICAL CERCLAGE N/A 09/22/2022   Procedure: CERCLAGE CERVICAL;  Surgeon: FInez Catalina MD;  Location: [facility];  Service: Gynecology;  Laterality: N/A;   CESAREAN SECTION     Reversal of tubal ligation     TUBAL  LIGATION     tubalr eversal      Family History  Problem Relation Age of Onset   Heart disease Maternal Grandmother    Stroke Maternal Grandmother    Cancer Father        prostate    Social History   Tobacco Use   Smoking status: Never   Smokeless tobacco: Never  Vaping Use   Vaping Use: Never used  Substance Use Topics   Alcohol use: No   Drug use: No    Allergies: No Known Allergies  Medications Prior to Admission  Medication Sig Dispense Refill Last Dose   Ascorbic Acid (VITAMIN C PO) Take 1 tablet by mouth 2 (two) times a week.   10/14/2022   aspirin 81 MG chewable tablet Chew 2 tablets (162 mg total) by mouth daily. 60 tablet 7 10/13/2022   docusate sodium (COLACE) 100 MG capsule Take 100 mg by mouth in the morning.   10/14/2022   polyethylene glycol powder (GLYCOLAX/MIRALAX) 17 GM/SCOOP powder Take 1 Container by mouth once.   10/14/2022   Prenat-Fe Poly-Methfol-FA-DHA (VITAFOL ULTRA) 29-0.6-0.4-200 MG CAPS Take 1 capsule by mouth daily. 30 capsule 12 10/14/2022  Probiotic Product (PROBIOTIC PO) Take 1 capsule by mouth in the morning. [employer] Dr. Gala Lewandowsky Women's Probiotics   10/14/2022   progesterone 200 MG SUPP Place 200 mg vaginally at bedtime.   10/14/2022   progesterone (PROMETRIUM) 200 MG capsule Take 1 capsule (200 mg total) by mouth daily. (Patient not taking: Reported on 10/12/2022) 30 capsule 11     Review of Systems  Constitutional:  Negative for chills, fatigue and fever.  Eyes:  Negative for pain and visual disturbance.  Respiratory:  Negative for apnea, shortness of breath and wheezing.   Cardiovascular:  Negative for chest pain and palpitations.  Gastrointestinal:  Positive for abdominal pain. Negative for constipation, diarrhea, nausea and vomiting.  Genitourinary:  Positive for dysuria, pelvic pain and vaginal discharge. Negative for difficulty urinating, vaginal bleeding and vaginal pain.  Musculoskeletal:  Negative for back pain.  Neurological:   Negative for seizures, weakness and headaches.  Psychiatric/Behavioral:  Negative for suicidal ideas.    Physical Exam   Blood pressure (!) 140/81, pulse 88, temperature 99.4 F (37.4 C), temperature source Oral, resp. rate 17, height 5' 10.08" (1.78 m), weight 103.3 kg, last menstrual period 06/21/2022, SpO2 100 %, unknown if currently breastfeeding.  Physical Exam Vitals and nursing note reviewed.  Constitutional:      General: She is not in acute distress.    Appearance: Normal appearance.  HENT:     Head: Normocephalic.  Pulmonary:     Effort: Pulmonary effort is normal.  Abdominal:     Palpations: Abdomen is soft.     Tenderness: There is abdominal tenderness in the suprapubic area.       Comments: Previous C/S and tummy tuck scar observed  - Tenderness on palpation noted with slight distension.   Musculoskeletal:     Cervical back: Normal range of motion.  Skin:    General: Skin is warm and dry.  Neurological:     Mental Status: She is alert and oriented to person, place, and time.  Psychiatric:        Mood and Affect: Mood normal.    FHT obtained in triage  MAU Course  Procedures Orders Placed This Encounter  Procedures   Wet prep, genital   [place] MFM OB Limited   Urinalysis, Routine w reflex microscopic -Urine, Clean Catch   Results for orders placed or performed during the hospital encounter of 10/14/22 (from the past 24 hour(s))  Urinalysis, Routine w reflex microscopic -Urine, Clean Catch     Status: Abnormal   Collection Time: 10/14/22  1:41 PM  Result Value Ref Range   Color, Urine STRAW (A) YELLOW   APPearance CLEAR CLEAR   Specific Gravity, Urine 1.003 (L) 1.005 - 1.030   pH 6.0 5.0 - 8.0   Glucose, UA NEGATIVE NEGATIVE mg/dL   Hgb urine dipstick NEGATIVE NEGATIVE   Bilirubin Urine NEGATIVE NEGATIVE   Ketones, ur NEGATIVE NEGATIVE mg/dL   Protein, ur NEGATIVE NEGATIVE mg/dL   Nitrite NEGATIVE NEGATIVE   Leukocytes,Ua NEGATIVE NEGATIVE  Wet prep,  genital     Status: Abnormal   Collection Time: 10/14/22  2:17 PM   Specimen: PATH Cytology Cervicovaginal Ancillary Only  Result Value Ref Range   Yeast Wet Prep HPF POC NONE SEEN NONE SEEN   Trich, Wet Prep NONE SEEN NONE SEEN   Clue Cells Wet Prep HPF POC NONE SEEN NONE SEEN   WBC, Wet Prep HPF POC >=10 (A) <10   Sperm NONE SEEN  MDM - Recommended  patient have a speculum exam to observe cervix and cerclage, as well as abnormal vaginal discharge. Patient declined and would like to limit as much vaginal manipulation as possible.   - UA straw colored, UA reflexed to culture.  - Toco Quiet  - PreLim [place] results revealed a normal living IUP in oblique presentation. Anterior placenta. Normal AFI Cervical length 3cm. [place] completed on 10/12/22 revealed : cx length w/o pressure 2.2-2.4 cm,cerclage to external OS 1.4 cm,cx with pressure 2 cm,amnionic sludge.  - Low suspicion for preterm labor.  - Patient to [place] and was recommended to urinate d/t bladder being distended on [place]. Patient states she did not "go much at that time" and states she "went just prior to [place]."  - Reassessment @ 1545. Distention and tenderness still present. Bladder scan ordered.  - Bladder scan 518m observed with tenderness during scan. Patient able to urinate in hat 4057m  - Consulted to Dr. ArRoselie Awkwardn treatment for suspected UTI given burning and frequency with urination, distension and tenderness UA results. MD agrees to treat empirically and await culture results.   - Plan for discharge   Assessment and Plan   1. Pelvic pain affecting pregnancy in second trimester, antepartum   2. Multigravida of advanced maternal age in second trimester   3. Cervical cerclage suture present in second trimester   4. [redacted] weeks gestation of pregnancy    - Reviewed results and plan of care with patient and patient agreeable to plan of care.  - Rx for Duricef sent to outpatient pharmacy for pick up.  - Worsening signs and return  precautions reviewed.  - Early pregnancy precautions reviewed. - Patient discharged home in stable conditions and may return to MAU as needed.   ShJacquiline DoeMSN CNM  10/14/2022, 1:59 PM

## 2022-10-15 LAB — GC/CHLAMYDIA PROBE AMP (~~LOC~~) NOT AT ARMC
Chlamydia: NEGATIVE
Comment: NEGATIVE
Comment: NORMAL
Neisseria Gonorrhea: NEGATIVE

## 2022-10-15 LAB — CULTURE, OB URINE: Culture: NO GROWTH

## 2022-10-15 MED ORDER — CLINDAMYCIN HCL 300 MG PO CAPS: 300.0000 mg | ORAL_CAPSULE | Freq: Three times a day (TID) | ORAL | 0 refills | Status: DC

## 2022-10-15 MED ORDER — CEPHALEXIN 500 MG PO CAPS: 500.0000 mg | ORAL_CAPSULE | Freq: Three times a day (TID) | ORAL | 0 refills | Status: DC

## 2022-10-16 LAB — INTEGRATED 2
AFP MoM: 1
Alpha-Fetoprotein: 27.8 ng/mL
Crown Rump Length: 58.9 mm
DIA MoM: 1.41
DIA Value: 171.4 pg/mL
Estriol, Unconjugated: 0.62 ng/mL
Gest. Age on Collection Date: 12.3 weeks
Gestational Age: 16.6 weeks
Maternal Age at EDD: 41.8 yr
Nuchal Translucency (NT): 1.4 mm
Nuchal Translucency MoM: 1.07
Number of Fetuses: 1
PAPP-A MoM: 1.03
PAPP-A Value: 615.6 ng/mL
Test Results:: NEGATIVE
Weight: 217 [lb_av]
Weight: 221 [lb_av]
hCG MoM: 0.91
hCG Value: 22.9 IU/mL
uE3 MoM: 0.66

## 2022-10-31 ENCOUNTER — Ambulatory Visit (INDEPENDENT_AMBULATORY_CARE_PROVIDER_SITE_OTHER): Payer: Medicaid Other | Admitting: Obstetrics & Gynecology

## 2022-10-31 ENCOUNTER — Encounter: Payer: Self-pay | Admitting: Obstetrics & Gynecology

## 2022-10-31 ENCOUNTER — Encounter: Payer: Self-pay | Admitting: Advanced Practice Midwife

## 2022-10-31 ENCOUNTER — Other Ambulatory Visit: Payer: Self-pay | Admitting: Advanced Practice Midwife

## 2022-10-31 ENCOUNTER — Ambulatory Visit (INDEPENDENT_AMBULATORY_CARE_PROVIDER_SITE_OTHER): Payer: Medicaid Other

## 2022-10-31 VITALS — BP 123/78 | HR 87 | Wt 231.4 lb

## 2022-10-31 DIAGNOSIS — Z9889 Other specified postprocedural states: Secondary | ICD-10-CM | POA: Diagnosis not present

## 2022-10-31 DIAGNOSIS — Z3A11 11 weeks gestation of pregnancy: Secondary | ICD-10-CM

## 2022-10-31 DIAGNOSIS — O26812 Pregnancy related exhaustion and fatigue, second trimester: Secondary | ICD-10-CM | POA: Diagnosis not present

## 2022-10-31 DIAGNOSIS — O0992 Supervision of high risk pregnancy, unspecified, second trimester: Secondary | ICD-10-CM

## 2022-10-31 DIAGNOSIS — O09522 Supervision of elderly multigravida, second trimester: Secondary | ICD-10-CM

## 2022-10-31 DIAGNOSIS — O09521 Supervision of elderly multigravida, first trimester: Secondary | ICD-10-CM

## 2022-10-31 DIAGNOSIS — D561 Beta thalassemia: Secondary | ICD-10-CM | POA: Diagnosis not present

## 2022-10-31 DIAGNOSIS — O3432 Maternal care for cervical incompetence, second trimester: Secondary | ICD-10-CM

## 2022-10-31 DIAGNOSIS — Z3481 Encounter for supervision of other normal pregnancy, first trimester: Secondary | ICD-10-CM

## 2022-10-31 DIAGNOSIS — Z3A18 18 weeks gestation of pregnancy: Secondary | ICD-10-CM

## 2022-10-31 DIAGNOSIS — O0991 Supervision of high risk pregnancy, unspecified, first trimester: Secondary | ICD-10-CM

## 2022-10-31 DIAGNOSIS — Z363 Encounter for antenatal screening for malformations: Secondary | ICD-10-CM

## 2022-10-31 DIAGNOSIS — Z98891 History of uterine scar from previous surgery: Secondary | ICD-10-CM

## 2022-10-31 DIAGNOSIS — Z8759 Personal history of other complications of pregnancy, childbirth and the puerperium: Secondary | ICD-10-CM | POA: Diagnosis not present

## 2022-10-31 DIAGNOSIS — Z1379 Encounter for other screening for genetic and chromosomal anomalies: Secondary | ICD-10-CM

## 2022-10-31 NOTE — Progress Notes (Signed)
[place] PREGNANCY VISIT Patient name: Rebecca Webster MRN [id number]  Date of birth: January 23, 1981 Chief Complaint:   Routine Prenatal Visit  History of Present Illness:   Rebecca Webster is a 42 y.o. [id number] female at [redacted]w[redacted]d with an Estimated Date of Delivery: 03/28/23 being seen today for ongoing management of a high-risk pregnancy complicated by:  - Cervical insuff- s/p cerclage placement -followed by serial cervical lengths -on vaginal progesterone  -AMA -Beta Thal  Today she reports no complaints.    . Vag. Bleeding: None.  Movement: Present. denies leaking of fluid.      09/12/2022    1:42 PM 04/27/2022    8:41 AM  Depression screen PHQ 2/9  Decreased Interest 0 0  Down, Depressed, Hopeless 0 0  PHQ - 2 Score 0 0  Altered sleeping 2   Tired, decreased energy 1   Change in appetite 0   Feeling bad or failure about yourself  0   Trouble concentrating 0   Moving slowly or fidgety/restless 0   Suicidal thoughts 0   PHQ-9 Score 3      Current Outpatient Medications  Medication Instructions   Ascorbic Acid (VITAMIN C PO) 1 tablet, Oral, 2 times weekly   aspirin 162 mg, Oral, Daily   polyethylene glycol powder (GLYCOLAX/MIRALAX) 17 GM/SCOOP powder 1 Container, Oral,  Once   Prenat-Fe Poly-Methfol-FA-DHA (VITAFOL ULTRA) 29-0.6-0.4-200 MG CAPS 1 capsule, Oral, Daily   Probiotic Product (PROBIOTIC PO) 1 capsule, Oral, Every morning, [employer] Dr. Gala Lewandowsky Women's Probiotics    progesterone (PROMETRIUM) 200 mg, Oral, Daily   progesterone 200 mg, Vaginal, Daily at bedtime     Review of Systems:   Pertinent items are noted in HPI Denies abnormal vaginal discharge w/ itching/odor/irritation, headaches, visual changes, shortness of breath, chest pain, abdominal pain, severe nausea/vomiting, or problems with urination or bowel movements unless otherwise stated above. Pertinent History Reviewed:  Reviewed past medical,surgical, social, obstetrical and family history.   Reviewed problem list, medications and allergies. Physical Assessment:   Vitals:   10/31/22 1037  BP: 123/78  Pulse: 87  Weight: 231 lb 6.4 oz (105 kg)  Body mass index is 33.13 kg/m.           Physical Examination:   General appearance: alert, well appearing, and in no distress  Mental status: normal mood, behavior, speech, dress, motor activity, and thought processes  Skin: warm & dry   Extremities:      Cardiovascular: normal heart rate noted  Respiratory: normal respiratory effort, no distress  Abdomen: gravid, soft, non-tender  Pelvic: Cervical exam deferred         Fetal Status:     Movement: Present    Fetal Surveillance Testing today: cephalic,short cervix with funneling and sludge,cervix w/o pressure 1.9 cm,with pressure 1.6 cm,1.5 cm from cerclage to external OS,FHR 159 bpm,anterior placenta gr 0,SVP of fluid 6.1 cm    Chaperone: N/A    No results found for this or any previous visit (from the past 24 hour(s)).   Assessment & Plan:  High-risk pregnancy: [id number] at [redacted]w[redacted]d with an Estimated Date of Delivery: 03/28/23   1) Cervical insuff. -cervix with some shortening since last visit -pt currently asymptomatic -continue serial exams as scheduled -pt asked about MFM referral- discussed that if she desires referral this can be arranged.   ADDENDUM: Reviewed case with MFM- possible consideration for cerclage revision with Dr. Donalee Citrin.  Advise repeat TVUS next week to note if further cervical shortening occurs -  pt called and scheduled for [place] next week  2) Beta thal -pt request CBC, iron panel  3) AMA  Meds: No orders of the defined types were placed in this encounter.   Labs/procedures today: [place] as above  Treatment Plan:  as outlined above. Discussed potential for hospitalization pending preterm labor/cervical status  Reviewed: Preterm labor symptoms and general obstetric precautions including but not limited to vaginal bleeding, contractions, leaking of fluid  and fetal movement were reviewed in detail with the patient.  All questions were answered. Pt has home bp cuff. Check bp weekly, let us know if >140/90.   Follow-up: as scheduled   Future Appointments  Date Time Provider [place]  11/15/2022 10:45 AM [facility] [place] [facility] None  11/15/2022 11:50 AM Eure, Mertie Clause, MD [facility]    Orders Placed This Encounter  Procedures   US OB Comp + 14 Wk   US OB Transvaginal   CBC   Iron, TIBC and Ferritin Panel    Janyth Pupa, DO Attending [place], [place] for [employer], [place]

## 2022-10-31 NOTE — Progress Notes (Addendum)
[place] [id number] wks,cephalic,short cervix with funneling and sludge,cervix w/o pressure 1.9 cm,with pressure 1.6 cm,1.5 cm from cerclage to external OS,FHR 159 bpm,anterior placenta gr 0,SVP of fluid 6.1 cm

## 2022-11-01 ENCOUNTER — Other Ambulatory Visit: Payer: Self-pay | Admitting: Obstetrics & Gynecology

## 2022-11-01 DIAGNOSIS — D509 Iron deficiency anemia, unspecified: Secondary | ICD-10-CM

## 2022-11-01 DIAGNOSIS — O0992 Supervision of high risk pregnancy, unspecified, second trimester: Secondary | ICD-10-CM

## 2022-11-01 DIAGNOSIS — O3432 Maternal care for cervical incompetence, second trimester: Secondary | ICD-10-CM

## 2022-11-01 LAB — CBC
Hematocrit: 36.6 % (ref 34.0–46.6)
Hemoglobin: 11.2 g/dL (ref 11.1–15.9)
MCH: 22.6 pg — ABNORMAL LOW (ref 26.6–33.0)
MCHC: 30.6 g/dL — ABNORMAL LOW (ref 31.5–35.7)
MCV: 74 fL — ABNORMAL LOW (ref 79–97)
Platelets: 243 10*3/uL (ref 150–450)
RBC: 4.95 x10E6/uL (ref 3.77–5.28)
RDW: 16.1 % — ABNORMAL HIGH (ref 11.7–15.4)
WBC: 6.4 10*3/uL (ref 3.4–10.8)

## 2022-11-01 LAB — IRON,TIBC AND FERRITIN PANEL
Ferritin: 11 ng/mL — ABNORMAL LOW (ref 15–150)
Iron Saturation: 19 % (ref 15–55)
Iron: 109 ug/dL (ref 27–159)
Total Iron Binding Capacity: 577 ug/dL (ref 250–450)
UIBC: 468 ug/dL — ABNORMAL HIGH (ref 131–425)

## 2022-11-01 MED ORDER — FERROUS GLUCONATE 324 (38 FE) MG PO TABS: 324.0000 mg | ORAL_TABLET | ORAL | 2 refills | Status: DC

## 2022-11-01 NOTE — Progress Notes (Signed)
Iron supplement sent in

## 2022-11-03 ENCOUNTER — Encounter (HOSPITAL_COMMUNITY): Payer: Self-pay | Admitting: Family Medicine

## 2022-11-03 ENCOUNTER — Inpatient Hospital Stay (HOSPITAL_BASED_OUTPATIENT_CLINIC_OR_DEPARTMENT_OTHER): Payer: Medicaid Other

## 2022-11-03 ENCOUNTER — Inpatient Hospital Stay (HOSPITAL_COMMUNITY)
Admission: AD | Admit: 2022-11-03 | Discharge: 2022-11-03 | Disposition: A | Discharge status: Home / Self Care | Payer: Medicaid Other | Attending: Family Medicine | Admitting: Family Medicine

## 2022-11-03 DIAGNOSIS — O26892 Other specified pregnancy related conditions, second trimester: Secondary | ICD-10-CM | POA: Insufficient documentation

## 2022-11-03 DIAGNOSIS — R109 Unspecified abdominal pain: Secondary | ICD-10-CM | POA: Insufficient documentation

## 2022-11-03 DIAGNOSIS — Z3A19 19 weeks gestation of pregnancy: Secondary | ICD-10-CM | POA: Diagnosis not present

## 2022-11-03 DIAGNOSIS — O09212 Supervision of pregnancy with history of pre-term labor, second trimester: Secondary | ICD-10-CM

## 2022-11-03 DIAGNOSIS — O99612 Diseases of the digestive system complicating pregnancy, second trimester: Secondary | ICD-10-CM | POA: Diagnosis not present

## 2022-11-03 DIAGNOSIS — O3432 Maternal care for cervical incompetence, second trimester: Secondary | ICD-10-CM | POA: Diagnosis not present

## 2022-11-03 DIAGNOSIS — O09522 Supervision of elderly multigravida, second trimester: Secondary | ICD-10-CM | POA: Insufficient documentation

## 2022-11-03 DIAGNOSIS — O99891 Other specified diseases and conditions complicating pregnancy: Secondary | ICD-10-CM

## 2022-11-03 DIAGNOSIS — K5900 Constipation, unspecified: Secondary | ICD-10-CM | POA: Diagnosis not present

## 2022-11-03 LAB — URINALYSIS, ROUTINE W REFLEX MICROSCOPIC
Bilirubin Urine: NEGATIVE
Glucose, UA: NEGATIVE mg/dL
Hgb urine dipstick: NEGATIVE
Ketones, ur: NEGATIVE mg/dL
Leukocytes,Ua: NEGATIVE
Nitrite: NEGATIVE
Protein, ur: NEGATIVE mg/dL
Specific Gravity, Urine: 1.004 — ABNORMAL LOW (ref 1.005–1.030)
pH: 6 (ref 5.0–8.0)

## 2022-11-03 NOTE — Discharge Instructions (Signed)
You have constipation which is hard stools that are difficult to pass. It is important to have regular bowel movements every 1-3 days that are soft and easy to pass. Hard stools increase your risk of hemorrhoids and are very uncomfortable.   To prevent constipation you can increase the amount of fiber in your diet. Examples of foods with fiber are leafy greens, whole grain breads, oatmeal and other grains.  It is also important to drink at least 64 ounces of water everyday.   If you have not had a bowel movement in 4-5 days, you made need to clean out your bowel.  This will help you establish normal movements through your bowel.    Miralax Clean out Take 8 capfuls of miralax in 64 oz of gatorade. You can use any fluid that appeals to you (gatorade, water, juice) Continue to drink at least 64 ounces of water throughout the day You can repeat with another 8 capfuls of miralax in 64 oz of gatorade if you are not having a large amount of stools You will need to be at home and close to a bathroom for about 8 hours when you do the above as you may need to go to the bathroom frequently.   After you are cleaned out: - Start Colace100mg twice daily - Start Miralax once daily - Start a daily fiber supplement like metamucil or citrucel - You can safely use enemas in pregnancy  - if you are having diarrhea you can reduce to Colace once a day or miralax every other day or a 1/2 capful daily.   

## 2022-11-03 NOTE — MAU Note (Signed)
Pt says she had  vag U/S done on Wed - at [facility]. The ultrasonography leaned on her abd  and since then  pt has had abd pain  and back ache . She told Dr Nelda Marseille- about this .  Pain has not gotten worse - but has not gone away .  Feels baby moving

## 2022-11-03 NOTE — MAU Provider Note (Signed)
History     [id number]  Arrival date and time: 11/03/22 [id number]    Chief Complaint  Patient presents with   Abdominal Pain   Back Pain     HPI Rebecca Webster is a 42 y.o. at [redacted]w[redacted]d by LMP who presents for abdominal pain & back pain. Symptoms started Tuesday after her ultrasound. Reports constant abdominal/back pain & pressure that has been persistent. Hasn't treated symptoms. Nothing makes better or worse. Has history indicated cerclage in place. Denies n/v/d, dysuria, vaginal bleeding, vaginal discharge, or LOF. Has had issues with constipation throughout the pregnancy. Last BM was yesterday.    O/Positive/-- (02/07 1619)  OB History     Gravida  10   Para  5   Term  3   Preterm  2   AB  4   Living  6      SAB  3   IAB      Ectopic  1   Multiple  1   Live Births  6           Past Medical History:  Diagnosis Date   Anemia    not currently anemic   Gestational HTN    Incompetent cervix     Past Surgical History:  Procedure Laterality Date   ABDOMINOPLASTY     CERVICAL CERCLAGE N/A 05/06/2019   Procedure: CERCLAGE CERVICAL;  Surgeon: Servando Salina, MD;  Location: [facility];  Service: Gynecology;  Laterality: N/A;   CERVICAL CERCLAGE N/A 09/29/2019   Procedure: CERCLAGE CERVICAL;  Surgeon: Servando Salina, MD;  Location: [facility];  Service: Gynecology;  Laterality: N/A;  EDD: 03/28/20   CERVICAL CERCLAGE N/A 09/22/2022   Procedure: CERCLAGE CERVICAL;  Surgeon: Inez Catalina, MD;  Location: [facility];  Service: Gynecology;  Laterality: N/A;   CESAREAN SECTION  2001   Reversal of tubal ligation     TUBAL LIGATION      Family History  Problem Relation Age of Onset   Heart disease Maternal Grandmother    Stroke Maternal Grandmother    Cancer Father        prostate    Social History   Socioeconomic History   Marital status: Married    Spouse name: Not on file   Number of children: Not on file   Years of education: Not on  file   Highest education level: Not on file  Occupational History   Not on file  Tobacco Use   Smoking status: Never   Smokeless tobacco: Never  Vaping Use   Vaping Use: Never used  Substance and Sexual Activity   Alcohol use: No   Drug use: No   Sexual activity: Yes    Birth control/protection: None  Other Topics Concern   Not on file  Social History Narrative   Not on file   Social Determinants of Health   Financial Resource Strain: Not on file  Food Insecurity: Not on file  Transportation Needs: Not on file  Physical Activity: Not on file  Stress: Not on file  Social Connections: Not on file  Intimate Partner Violence: Not on file    No Known Allergies  No current facility-administered medications on file prior to encounter.   Current Outpatient Medications on File Prior to Encounter  Medication Sig Dispense Refill   Ascorbic Acid (VITAMIN C PO) Take 1 tablet by mouth 2 (two) times a week.     aspirin 81 MG chewable tablet Chew 2 tablets (162 mg  total) by mouth daily. 60 tablet 7   ferrous gluconate (FERGON) 324 MG tablet Take 1 tablet (324 mg total) by mouth every other day. 45 tablet 2   polyethylene glycol powder (GLYCOLAX/MIRALAX) 17 GM/SCOOP powder Take 1 Container by mouth once.     Prenat-Fe Poly-Methfol-FA-DHA (VITAFOL ULTRA) 29-0.6-0.4-200 MG CAPS Take 1 capsule by mouth daily. 30 capsule 12   Probiotic Product (PROBIOTIC PO) Take 1 capsule by mouth in the morning. [employer] Dr. Gala Lewandowsky Women's Probiotics     progesterone 200 MG SUPP Place 200 mg vaginally at bedtime.     progesterone (PROMETRIUM) 200 MG capsule Take 1 capsule (200 mg total) by mouth daily. (Patient not taking: Reported on 10/31/2022) 30 capsule 11     ROS Pertinent positives and negative per HPI, all others reviewed and negative  Physical Exam   BP 121/68   Pulse 79   Temp 98.5 F (36.9 C) (Oral)   Resp 16   Ht 5\' 10"  (1.778 m)   Wt 105.1 kg   LMP 06/21/2022 (Exact Date)    BMI 33.26 kg/m   Patient Vitals for the past 24 hrs:  BP Temp Temp src Pulse Resp Height Weight  11/03/22 0728 121/68 -- -- 79 -- -- --  11/03/22 0542 131/76 98.5 F (36.9 C) Oral 85 16 5\' 10"  (1.778 m) 105.1 kg    Physical Exam Vitals and nursing note reviewed. Exam conducted with a chaperone present.  Constitutional:      General: She is not in acute distress.    Appearance: She is well-developed. She is not ill-appearing.  HENT:     Head: Normocephalic and atraumatic.  Eyes:     General: No scleral icterus.       Right eye: No discharge.        Left eye: No discharge.     Conjunctiva/sclera: Conjunctivae normal.  Pulmonary:     Effort: Pulmonary effort is normal. No respiratory distress.  Genitourinary:    Comments: Pelvic: no blood, thick white discharge, no pooling of fluid. Cervix visually closed with cerclage in place, knot at 12 o'clock Neurological:     General: No focal deficit present.     Mental Status: She is alert.  Psychiatric:        Mood and Affect: Mood normal.        Behavior: Behavior normal.      Labs Results for orders placed or performed during the hospital encounter of 11/03/22 (from the past 24 hour(s))  Urinalysis, Routine w reflex microscopic -Urine, Clean Catch     Status: Abnormal   Collection Time: 11/03/22  6:14 AM  Result Value Ref Range   Color, Urine STRAW (A) YELLOW   APPearance CLEAR CLEAR   Specific Gravity, Urine 1.004 (L) 1.005 - 1.030   pH 6.0 5.0 - 8.0   Glucose, UA NEGATIVE NEGATIVE mg/dL   Hgb urine dipstick NEGATIVE NEGATIVE   Bilirubin Urine NEGATIVE NEGATIVE   Ketones, ur NEGATIVE NEGATIVE mg/dL   Protein, ur NEGATIVE NEGATIVE mg/dL   Nitrite NEGATIVE NEGATIVE   Leukocytes,Ua NEGATIVE NEGATIVE    Imaging No results found.  MAU Course  Procedures Lab Orders         Urinalysis, Routine w reflex microscopic -Urine, Clean Catch     No orders of the defined types were placed in this encounter.  Imaging Orders          [place] MFM OB LIMITED  US MFM OB Transvaginal      MDM low  Assessment and Plan   1. Abdominal pain during pregnancy in second trimester  -Constant abdominal pain & back pain. Cerclage remains in place & no evidence of further shortening on ultrasound. U/a is negative. Symptoms possibly related to her issues with constipation. Instructed to increase colace to BID & start taking dulcolax as needed. Reviewed reasons to return to MAU  2. Constipation during pregnancy in second trimester   3. Cervical cerclage suture present in second trimester   4. [redacted] weeks gestation of pregnancy     #FWB: FHT 150 by doppler    Dispo: discharged to home in stable condition.   Discharge Instructions     Discharge patient   Complete by: As directed    Discharge disposition: 01-Home or Self Care   Discharge patient date: 11/03/2022       Jorje Guild, NP 11/03/22 7:35 AM  Allergies as of 11/03/2022   No Known Allergies      Medication List     TAKE these medications    aspirin 81 MG chewable tablet Chew 2 tablets (162 mg total) by mouth daily.   ferrous gluconate 324 MG tablet Commonly known as: FERGON Take 1 tablet (324 mg total) by mouth every other day.   polyethylene glycol powder 17 GM/SCOOP powder Commonly known as: GLYCOLAX/MIRALAX Take 1 Container by mouth once.   PROBIOTIC PO Take 1 capsule by mouth in the morning. [employer] Dr. Gala Lewandowsky Women's Probiotics   progesterone 200 MG capsule Commonly known as: Prometrium Take 1 capsule (200 mg total) by mouth daily.   progesterone 200 MG Supp Place 200 mg vaginally at bedtime.   Vitafol Ultra 29-0.6-0.4-200 MG Caps Take 1 capsule by mouth daily.   VITAMIN C PO Take 1 tablet by mouth 2 (two) times a week.

## 2022-11-05 ENCOUNTER — Telehealth: Payer: Self-pay | Admitting: Obstetrics & Gynecology

## 2022-11-05 ENCOUNTER — Encounter: Payer: Self-pay | Admitting: Obstetrics & Gynecology

## 2022-11-05 ENCOUNTER — Other Ambulatory Visit: Payer: Self-pay | Admitting: Women's Health

## 2022-11-05 ENCOUNTER — Other Ambulatory Visit: Payer: Self-pay | Admitting: Obstetrics and Gynecology

## 2022-11-05 ENCOUNTER — Ambulatory Visit (INDEPENDENT_AMBULATORY_CARE_PROVIDER_SITE_OTHER): Payer: Medicaid Other

## 2022-11-05 DIAGNOSIS — Z3A19 19 weeks gestation of pregnancy: Secondary | ICD-10-CM

## 2022-11-05 DIAGNOSIS — O3432 Maternal care for cervical incompetence, second trimester: Secondary | ICD-10-CM | POA: Diagnosis not present

## 2022-11-05 DIAGNOSIS — O09522 Supervision of elderly multigravida, second trimester: Secondary | ICD-10-CM

## 2022-11-05 DIAGNOSIS — O0992 Supervision of high risk pregnancy, unspecified, second trimester: Secondary | ICD-10-CM | POA: Diagnosis not present

## 2022-11-05 MED ORDER — NIFEDIPINE 10 MG PO CAPS: 10.0000 mg | ORAL_CAPSULE | Freq: Three times a day (TID) | ORAL | 6 refills | Status: DC | PRN

## 2022-11-05 MED ORDER — PROGESTERONE 200 MG PO CAPS: 200.0000 mg | ORAL_CAPSULE | Freq: Every day | ORAL | 6 refills | Status: DC

## 2022-11-05 NOTE — Progress Notes (Signed)
[place] TA/TV: 0000000 wks,cephalic,anterior fundal placenta gr 0,cerclage is present,short cervix with funneling,amnionic sludge,cervical length with and w/o valsalva 1.4 cm,pt refused pressure,FHR 135 bpm,SVP of fluid 5.4 cm,Kim Booker discussed results with MFM and patient   Chaperone Tish

## 2022-11-05 NOTE — Progress Notes (Signed)
Notified by [employer], ultrasonographer CL today 1.4cm (w/o pressure d/t pt declining), which is shorter than last week. Currently has cerclage. Per Dr. Annie Main note on 3/27 she spoke w/ MFM and would consider revision of cerclage if cx shorter this week. No MD in office today. Called Dr. Elly Modena (2nd attending at [facility]) and she will contact MFM about plan and she will call pt directly to let her know. Pt's phone number verified.  Roma Schanz, CNM, [facility] 11/05/2022 12:37 PM

## 2022-11-05 NOTE — Progress Notes (Signed)
Pt called RN and wanted to know if could be rx'd procardia for cramping. Discussed w/ Dr. Elly Modena (2nd attending [facility]) and recommended 10mg  prn, stay hydrated. Rx sent.  Roma Schanz, CNM, [facility] 11/05/2022 4:48 PM

## 2022-11-05 NOTE — Telephone Encounter (Signed)
Returned patient's call.  States she was placed on Procardia in her early second trimester last pregnancy.  Reviewed Dr Garwin Brothers note and patient requested Procardia and was started on it PRN at 22wks.  Discussed with KRB who discussed with Dr Elly Modena and prescription will be sent in.  Advised to check back with her pharmacy later this evening.

## 2022-11-05 NOTE — Progress Notes (Signed)
Case reviewed with Dr. Donalee Citrin, [place] attending on call. He reviewed all ultrasounds since 10/31/22 and current ultrasound showing a cervical length of 1.4 cm. He does not recommend cerclage revision at this time. He recommends repeat cervical length in 2 weeks and to continue vaginal progesterone.   Patient was contacted and informed of current recommendations and plan. We reviewed her upcoming appointments. Patient verbalized understanding and all questions were answered Refill for prometrium will be provided

## 2022-11-05 NOTE — Telephone Encounter (Signed)
Hrob patient was wanting to know about getting started on procardia. Please advise.

## 2022-11-15 ENCOUNTER — Ambulatory Visit (INDEPENDENT_AMBULATORY_CARE_PROVIDER_SITE_OTHER): Payer: Medicaid Other | Admitting: Obstetrics & Gynecology

## 2022-11-15 ENCOUNTER — Encounter: Payer: Self-pay | Admitting: Obstetrics & Gynecology

## 2022-11-15 ENCOUNTER — Ambulatory Visit (INDEPENDENT_AMBULATORY_CARE_PROVIDER_SITE_OTHER): Payer: Medicaid Other

## 2022-11-15 VITALS — BP 120/79 | HR 84 | Wt 236.0 lb

## 2022-11-15 DIAGNOSIS — O09522 Supervision of elderly multigravida, second trimester: Secondary | ICD-10-CM

## 2022-11-15 DIAGNOSIS — O3432 Maternal care for cervical incompetence, second trimester: Secondary | ICD-10-CM

## 2022-11-15 DIAGNOSIS — Z3A21 21 weeks gestation of pregnancy: Secondary | ICD-10-CM | POA: Diagnosis not present

## 2022-11-15 DIAGNOSIS — O0992 Supervision of high risk pregnancy, unspecified, second trimester: Secondary | ICD-10-CM

## 2022-11-15 NOTE — Progress Notes (Signed)
[place] 21 wks,cephalic,anterior placenta gr  0,normal ovaries,FHR 164 bpm,SVP of fluid 5 cm,short cervix with funneling, amnionic sludge,cervical length 1-1.2 cm with valsalva only per pt.,EFW 382 g 38%,anatomy complete,no obvious abnormalities

## 2022-11-15 NOTE — Progress Notes (Signed)
HIGH-RISK PREGNANCY VISIT Patient name: Rebecca Webster MRN [id number]  Date of birth: 1981-06-21 Chief Complaint:   Routine Prenatal Visit  History of Present Illness:   Rebecca Webster is a 42 y.o. [id number] female at [redacted]w[redacted]d with an Estimated Date of Delivery: 03/28/23 being seen today for ongoing management of a high-risk pregnancy complicated by cervical incompetence.    Today she reports no complaints. Contractions: Not present. Vag. Bleeding: None.  Movement: Present. denies leaking of fluid.      09/12/2022    1:42 PM 04/27/2022    8:41 AM  Depression screen PHQ 2/9  Decreased Interest 0 0  Down, Depressed, Hopeless 0 0  PHQ - 2 Score 0 0  Altered sleeping 2   Tired, decreased energy 1   Change in appetite 0   Feeling bad or failure about yourself  0   Trouble concentrating 0   Moving slowly or fidgety/restless 0   Suicidal thoughts 0   PHQ-9 Score 3         09/12/2022    1:43 PM  GAD 7 : Generalized Anxiety Score  Nervous, Anxious, on Edge 1  Control/stop worrying 0  Worry too much - different things 0  Trouble relaxing 0  Restless 0  Easily annoyed or irritable 0  Afraid - awful might happen 0  Total GAD 7 Score 1     Review of Systems:   Pertinent items are noted in HPI Denies abnormal vaginal discharge w/ itching/odor/irritation, headaches, visual changes, shortness of breath, chest pain, abdominal pain, severe nausea/vomiting, or problems with urination or bowel movements unless otherwise stated above. Pertinent History Reviewed:  Reviewed past medical,surgical, social, obstetrical and family history.  Reviewed problem list, medications and allergies. Physical Assessment:   Vitals:   11/15/22 1207  BP: 120/79  Pulse: 84  Weight: 236 lb (107 kg)  Body mass index is 33.86 kg/m.           Physical Examination:   General appearance: alert, well appearing, and in no distress  Mental status: alert, oriented to person, place, and time  Skin: warm & dry    Extremities: Edema: None    Cardiovascular: normal heart rate noted  Respiratory: normal respiratory effort, no distress  Abdomen: gravid, soft, non-tender  Pelvic: Cervical exam deferred         Fetal Status:     Movement: Present    Fetal Surveillance Testing today: sonogram   Chaperone: N/A    No results found for this or any previous visit (from the past 24 hour(s)).  Assessment & Plan:  High-risk pregnancy: [id number] at [redacted]w[redacted]d with an Estimated Date of Delivery: 03/28/23      ICD-10-CM   1. Supervision of high risk pregnancy in second trimester  O09.92     2. Cervical insufficiency during pregnancy in second trimester, antepartum  O34.32     3. Cervical cerclage suture present in second trimester  O34.32         Meds: No orders of the defined types were placed in this encounter.   Orders: No orders of the defined types were placed in this encounter.    Labs/procedures today: U/S  Treatment Plan:  continue decreased activity    Follow-up: No follow-ups on file.   Future Appointments  Date Time Provider Department Center  11/28/2022  7:30 AM [facility] NURSE [facility]  11/28/2022  7:45 AM [facility] US4 [facility]    No orders of the defined types were  placed in this encounter.  Lazaro Arms  Attending Physician for the Center for [facility] 11/15/2022 12:41 PM

## 2022-11-28 ENCOUNTER — Other Ambulatory Visit: Payer: Self-pay | Admitting: *Deleted

## 2022-11-28 ENCOUNTER — Ambulatory Visit: Payer: Medicaid Other | Attending: Obstetrics & Gynecology

## 2022-11-28 ENCOUNTER — Encounter: Payer: Self-pay | Admitting: *Deleted

## 2022-11-28 ENCOUNTER — Telehealth: Payer: Self-pay | Admitting: *Deleted

## 2022-11-28 ENCOUNTER — Ambulatory Visit: Payer: Medicaid Other | Admitting: *Deleted

## 2022-11-28 ENCOUNTER — Ambulatory Visit: Payer: Medicaid Other | Attending: Obstetrics | Admitting: Obstetrics

## 2022-11-28 VITALS — BP 124/74 | HR 88

## 2022-11-28 DIAGNOSIS — O3432 Maternal care for cervical incompetence, second trimester: Secondary | ICD-10-CM | POA: Insufficient documentation

## 2022-11-28 DIAGNOSIS — O09212 Supervision of pregnancy with history of pre-term labor, second trimester: Secondary | ICD-10-CM | POA: Diagnosis not present

## 2022-11-28 DIAGNOSIS — O09292 Supervision of pregnancy with other poor reproductive or obstetric history, second trimester: Secondary | ICD-10-CM

## 2022-11-28 DIAGNOSIS — O09522 Supervision of elderly multigravida, second trimester: Secondary | ICD-10-CM | POA: Diagnosis not present

## 2022-11-28 DIAGNOSIS — Z3A22 22 weeks gestation of pregnancy: Secondary | ICD-10-CM

## 2022-11-28 DIAGNOSIS — O26872 Cervical shortening, second trimester: Secondary | ICD-10-CM | POA: Diagnosis not present

## 2022-11-28 DIAGNOSIS — O3430 Maternal care for cervical incompetence, unspecified trimester: Secondary | ICD-10-CM

## 2022-11-28 MED ORDER — INDOMETHACIN 25 MG PO CAPS: ORAL_CAPSULE | ORAL | 0 refills | Status: DC

## 2022-11-28 MED ORDER — NIFEDIPINE ER OSMOTIC RELEASE 30 MG PO TB24: 30.0000 mg | ORAL_TABLET | Freq: Every day | ORAL | 6 refills | Status: DC

## 2022-11-28 NOTE — Telephone Encounter (Signed)
Dr Parke Poisson with MFM spoke with Dr Alysia Penna regarding today's ultrasound.  Indocin ordered after discussion with Dr Parke Poisson, Dr Alysia Penna and patient.

## 2022-11-28 NOTE — Progress Notes (Signed)
MFM Note  Rebecca Webster was seen for a detailed fetal anatomy scan due to advanced maternal age (42 years old).  Her pregnancy has also been complicated by a history of cervical incompetence.  She currently has a cerclage in place and is being treated with daily vaginal progesterone along with Procardia 10 mg as needed for lower abdominal cramping. The patient reports that she feels occasional lower abdominal cramping.  She had a cell free DNA test earlier in her pregnancy which indicated a low risk for trisomy 5, 80, and 13. A female fetus is predicted.  She was informed that the fetal growth and amniotic fluid level were appropriate for her gestational age.   There were no obvious fetal anomalies noted on today's ultrasound exam.  However, today's exam was limited due to the fetal position.  The patient was informed that anomalies may be missed due to technical limitations. If the fetus is in a suboptimal position or maternal habitus is increased, visualization of the fetus in the maternal uterus may be impaired.  The increased risk of fetal aneuploidy due to advanced maternal age was discussed. Due to advanced maternal age, the patient was offered and declined an amniocentesis today for definitive diagnosis of fetal aneuploidy.  She is comfortable with her negative cell free DNA test.   A transvaginal ultrasound performed today shows a dynamic cervix.  Her overall cervical length was 2.29 cm long initially.  The cervical cerclage stitch was visualized.  However, the cervix became shorter and funneled probably during a contraction as we watched her cervical length during the exam.  The patient reports that the same thing happened during her last pregnancy in 2021 around the same gestational age and she received a course of Indocin which was able to prolong her pregnancy until 36+ weeks.  Due to her dynamic cervix, she was advised to continue the daily vaginal progesterone.  I will ask her  providers at [facility] to prescribe a 48-hour course of Indocin (50 mg p.o. x 1 followed by 25 mg p.o. every 6 hours for 48 hours).  She was advised to continue taking Procardia daily after she completes the Indocin course.  She will return to our office in 1 week for another transvaginal cervical length measurement.    The patient was advised that hopefully with the daily vaginal progesterone and the course of Indocin, her cervical length will remain stable and we will be able to help her achieve a good obstetrical outcome.  The patient stated that all of her questions were answered today.  A total of 30 minutes was spent counseling and coordinating the care for this patient.  Greater than 50% of the time was spent in direct face-to-face contact.

## 2022-12-06 ENCOUNTER — Ambulatory Visit: Payer: Medicaid Other | Attending: Obstetrics

## 2022-12-06 ENCOUNTER — Ambulatory Visit: Payer: Medicaid Other | Admitting: *Deleted

## 2022-12-06 VITALS — BP 119/73 | HR 86

## 2022-12-06 DIAGNOSIS — O3432 Maternal care for cervical incompetence, second trimester: Secondary | ICD-10-CM

## 2022-12-06 DIAGNOSIS — O09522 Supervision of elderly multigravida, second trimester: Secondary | ICD-10-CM | POA: Diagnosis not present

## 2022-12-06 DIAGNOSIS — Z3A24 24 weeks gestation of pregnancy: Secondary | ICD-10-CM

## 2022-12-06 DIAGNOSIS — O3430 Maternal care for cervical incompetence, unspecified trimester: Secondary | ICD-10-CM | POA: Diagnosis not present

## 2022-12-06 DIAGNOSIS — O09292 Supervision of pregnancy with other poor reproductive or obstetric history, second trimester: Secondary | ICD-10-CM | POA: Diagnosis not present

## 2022-12-06 DIAGNOSIS — O09212 Supervision of pregnancy with history of pre-term labor, second trimester: Secondary | ICD-10-CM | POA: Diagnosis not present

## 2022-12-13 ENCOUNTER — Ambulatory Visit (INDEPENDENT_AMBULATORY_CARE_PROVIDER_SITE_OTHER): Payer: Medicaid Other | Admitting: Obstetrics & Gynecology

## 2022-12-13 ENCOUNTER — Other Ambulatory Visit: Payer: Medicaid Other

## 2022-12-13 ENCOUNTER — Ambulatory Visit: Payer: Medicaid Other

## 2022-12-13 ENCOUNTER — Encounter: Payer: Self-pay | Admitting: Obstetrics & Gynecology

## 2022-12-13 VITALS — BP 123/86 | HR 108 | Wt 241.0 lb

## 2022-12-13 DIAGNOSIS — K5900 Constipation, unspecified: Secondary | ICD-10-CM

## 2022-12-13 DIAGNOSIS — O99612 Diseases of the digestive system complicating pregnancy, second trimester: Secondary | ICD-10-CM

## 2022-12-13 DIAGNOSIS — O0992 Supervision of high risk pregnancy, unspecified, second trimester: Secondary | ICD-10-CM

## 2022-12-13 DIAGNOSIS — O3432 Maternal care for cervical incompetence, second trimester: Secondary | ICD-10-CM

## 2022-12-13 DIAGNOSIS — Z3A25 25 weeks gestation of pregnancy: Secondary | ICD-10-CM

## 2022-12-13 NOTE — Progress Notes (Signed)
HIGH-RISK PREGNANCY VISIT Patient name: Rebecca Webster MRN [id number]  Date of birth: [birth year] Chief Complaint:   Routine Prenatal Visit  History of Present Illness:   Rebecca Webster is a 42 y.o. [id number] female at [redacted]w[redacted]d with an Estimated Date of Delivery: 03/28/23 being seen today for ongoing management of a high-risk pregnancy complicated by cervical insufficiency with cerclage placement, had amniotic sludge S/P oral antibiotics with resolution.    Today she reports no complaints. Except constipation Contractions: Irritability. Vag. Bleeding: None.  Movement: Present. denies leaking of fluid.      09/12/2022    1:42 PM 04/27/2022    8:41 AM  Depression screen PHQ 2/9  Decreased Interest 0 0  Down, Depressed, Hopeless 0 0  PHQ - 2 Score 0 0  Altered sleeping 2   Tired, decreased energy 1   Change in appetite 0   Feeling bad or failure about yourself  0   Trouble concentrating 0   Moving slowly or fidgety/restless 0   Suicidal thoughts 0   PHQ-9 Score 3         09/12/2022    1:43 PM  GAD 7 : Generalized Anxiety Score  Nervous, Anxious, on Edge 1  Control/stop worrying 0  Worry too much - different things 0  Trouble relaxing 0  Restless 0  Easily annoyed or irritable 0  Afraid - awful might happen 0  Total GAD 7 Score 1     Review of Systems:   Pertinent items are noted in HPI Denies abnormal vaginal discharge w/ itching/odor/irritation, headaches, visual changes, shortness of breath, chest pain, abdominal pain, severe nausea/vomiting, or problems with urination or bowel movements unless otherwise stated above. Pertinent History Reviewed:  Reviewed past medical,surgical, social, obstetrical and family history.  Reviewed problem list, medications and allergies. Physical Assessment:   Vitals:   12/13/22 0918  BP: 123/86  Pulse: (!) 108  Weight: 241 lb (109.3 kg)  Body mass index is 34.58 kg/m.           Physical Examination:   General appearance: alert,  well appearing, and in no distress  Mental status: alert, oriented to person, place, and time  Skin: warm & dry   Extremities:      Cardiovascular: normal heart rate noted  Respiratory: normal respiratory effort, no distress  Abdomen: gravid, soft, non-tender  Pelvic: Cervical exam deferred         Fetal Status: Fetal Heart Rate (bpm): 145 Fundal Height: 25 cm Movement: Present    Fetal Surveillance Testing today: FHR 145   Chaperone: N/A    No results found for this or any previous visit (from the past 24 hour(s)).  Assessment & Plan:  High-risk pregnancy: [id number] at [redacted]w[redacted]d with an Estimated Date of Delivery: 03/28/23      ICD-10-CM   1. Supervision of high risk pregnancy in second trimester  O09.92     2. Cervical insufficiency during pregnancy in second trimester, antepartum  O34.32     3. Cervical cerclage suture present in second trimester  O34.32     4. Constipation during pregnancy in second trimester  O99.612    K59.00    ^miralax powder 1 scoop daily for a week at a time to max of 4 scoops per day        Meds: No orders of the defined types were placed in this encounter.   Orders: No orders of the defined types were placed in this encounter.  Labs/procedures today:   Treatment Plan:  PN2 next visit  Reviewed: Preterm labor symptoms and general obstetric precautions including but not limited to vaginal bleeding, contractions, leaking of fluid and fetal movement were reviewed in detail with the patient.  All questions were answered. Does have home bp cuff. Office bp cuff given: not applicable. Check bp 2 times weekly, let us know if consistently >140 and/or >90.  Follow-up: Return in about 2 weeks (around 12/27/2022) for PN2, HROB.   Future Appointments  Date Time Provider Department Center  12/27/2022  7:30 AM [facility] NURSE [facility]  12/27/2022  7:45 AM [facility] US5 [facility]    No orders of the defined types were placed in this encounter.  Lazaro Arms  Attending Physician for the Center for [facility] 12/13/2022 9:41 AM

## 2022-12-17 ENCOUNTER — Inpatient Hospital Stay (HOSPITAL_COMMUNITY)
Admission: AD | Admit: 2022-12-17 | Discharge: 2023-01-23 | DRG: 831 | Disposition: A | Discharge status: Home / Self Care | Payer: Medicaid Other | Attending: Obstetrics and Gynecology | Admitting: Obstetrics and Gynecology

## 2022-12-17 ENCOUNTER — Encounter (HOSPITAL_COMMUNITY): Payer: Self-pay | Admitting: Obstetrics and Gynecology

## 2022-12-17 DIAGNOSIS — O3430 Maternal care for cervical incompetence, unspecified trimester: Secondary | ICD-10-CM | POA: Diagnosis present

## 2022-12-17 DIAGNOSIS — Z8742 Personal history of other diseases of the female genital tract: Secondary | ICD-10-CM | POA: Diagnosis present

## 2022-12-17 DIAGNOSIS — O34219 Maternal care for unspecified type scar from previous cesarean delivery: Secondary | ICD-10-CM | POA: Diagnosis present

## 2022-12-17 DIAGNOSIS — Z98891 History of uterine scar from previous surgery: Secondary | ICD-10-CM

## 2022-12-17 DIAGNOSIS — O3433 Maternal care for cervical incompetence, third trimester: Secondary | ICD-10-CM

## 2022-12-17 DIAGNOSIS — O321XX0 Maternal care for breech presentation, not applicable or unspecified: Secondary | ICD-10-CM | POA: Diagnosis present

## 2022-12-17 DIAGNOSIS — O09522 Supervision of elderly multigravida, second trimester: Secondary | ICD-10-CM

## 2022-12-17 DIAGNOSIS — O98812 Other maternal infectious and parasitic diseases complicating pregnancy, second trimester: Secondary | ICD-10-CM | POA: Diagnosis present

## 2022-12-17 DIAGNOSIS — Z3A25 25 weeks gestation of pregnancy: Secondary | ICD-10-CM

## 2022-12-17 DIAGNOSIS — Z8759 Personal history of other complications of pregnancy, childbirth and the puerperium: Secondary | ICD-10-CM

## 2022-12-17 DIAGNOSIS — B3731 Acute candidiasis of vulva and vagina: Secondary | ICD-10-CM | POA: Clinically undetermined

## 2022-12-17 DIAGNOSIS — O42919 Preterm premature rupture of membranes, unspecified as to length of time between rupture and onset of labor, unspecified trimester: Secondary | ICD-10-CM | POA: Diagnosis present

## 2022-12-17 DIAGNOSIS — O3432 Maternal care for cervical incompetence, second trimester: Secondary | ICD-10-CM | POA: Diagnosis present

## 2022-12-17 DIAGNOSIS — O99013 Anemia complicating pregnancy, third trimester: Secondary | ICD-10-CM | POA: Diagnosis present

## 2022-12-17 DIAGNOSIS — O132 Gestational [pregnancy-induced] hypertension without significant proteinuria, second trimester: Secondary | ICD-10-CM | POA: Diagnosis present

## 2022-12-17 DIAGNOSIS — Z7982 Long term (current) use of aspirin: Secondary | ICD-10-CM

## 2022-12-17 DIAGNOSIS — O42912 Preterm premature rupture of membranes, unspecified as to length of time between rupture and onset of labor, second trimester: Principal | ICD-10-CM | POA: Diagnosis present

## 2022-12-17 DIAGNOSIS — O0990 Supervision of high risk pregnancy, unspecified, unspecified trimester: Secondary | ICD-10-CM

## 2022-12-17 DIAGNOSIS — O99012 Anemia complicating pregnancy, second trimester: Secondary | ICD-10-CM | POA: Diagnosis present

## 2022-12-17 LAB — URINALYSIS, ROUTINE W REFLEX MICROSCOPIC
Bilirubin Urine: NEGATIVE
Glucose, UA: NEGATIVE mg/dL
Ketones, ur: NEGATIVE mg/dL
Leukocytes,Ua: NEGATIVE
Nitrite: NEGATIVE
Protein, ur: NEGATIVE mg/dL
Specific Gravity, Urine: 1.006 (ref 1.005–1.030)
pH: 7 (ref 5.0–8.0)

## 2022-12-17 NOTE — MAU Note (Signed)
Pt says has a circlage - since 13 weeks . Saw mucus plug pieces come out at 10pm. Last sex- mths ago [facility]  Has lower abd burning pain- not constant- told Dr's - they are unsure what causes this burning .

## 2022-12-18 ENCOUNTER — Inpatient Hospital Stay (HOSPITAL_COMMUNITY): Payer: Medicaid Other

## 2022-12-18 ENCOUNTER — Other Ambulatory Visit: Payer: Self-pay

## 2022-12-18 ENCOUNTER — Inpatient Hospital Stay (HOSPITAL_BASED_OUTPATIENT_CLINIC_OR_DEPARTMENT_OTHER): Payer: Medicaid Other

## 2022-12-18 DIAGNOSIS — Z3A28 28 weeks gestation of pregnancy: Secondary | ICD-10-CM | POA: Diagnosis not present

## 2022-12-18 DIAGNOSIS — O09522 Supervision of elderly multigravida, second trimester: Secondary | ICD-10-CM

## 2022-12-18 DIAGNOSIS — Z7982 Long term (current) use of aspirin: Secondary | ICD-10-CM | POA: Diagnosis not present

## 2022-12-18 DIAGNOSIS — Z3A26 26 weeks gestation of pregnancy: Secondary | ICD-10-CM | POA: Diagnosis not present

## 2022-12-18 DIAGNOSIS — O132 Gestational [pregnancy-induced] hypertension without significant proteinuria, second trimester: Secondary | ICD-10-CM | POA: Diagnosis not present

## 2022-12-18 DIAGNOSIS — O418X20 Other specified disorders of amniotic fluid and membranes, second trimester, not applicable or unspecified: Secondary | ICD-10-CM | POA: Diagnosis not present

## 2022-12-18 DIAGNOSIS — O09212 Supervision of pregnancy with history of pre-term labor, second trimester: Secondary | ICD-10-CM | POA: Diagnosis not present

## 2022-12-18 DIAGNOSIS — O3432 Maternal care for cervical incompetence, second trimester: Secondary | ICD-10-CM

## 2022-12-18 DIAGNOSIS — Z3A25 25 weeks gestation of pregnancy: Secondary | ICD-10-CM

## 2022-12-18 DIAGNOSIS — O42919 Preterm premature rupture of membranes, unspecified as to length of time between rupture and onset of labor, unspecified trimester: Secondary | ICD-10-CM | POA: Diagnosis not present

## 2022-12-18 DIAGNOSIS — O98812 Other maternal infectious and parasitic diseases complicating pregnancy, second trimester: Secondary | ICD-10-CM | POA: Diagnosis not present

## 2022-12-18 DIAGNOSIS — O3430 Maternal care for cervical incompetence, unspecified trimester: Secondary | ICD-10-CM | POA: Diagnosis present

## 2022-12-18 DIAGNOSIS — O42913 Preterm premature rupture of membranes, unspecified as to length of time between rupture and onset of labor, third trimester: Secondary | ICD-10-CM | POA: Diagnosis not present

## 2022-12-18 DIAGNOSIS — O42912 Preterm premature rupture of membranes, unspecified as to length of time between rupture and onset of labor, second trimester: Secondary | ICD-10-CM

## 2022-12-18 DIAGNOSIS — O09292 Supervision of pregnancy with other poor reproductive or obstetric history, second trimester: Secondary | ICD-10-CM

## 2022-12-18 DIAGNOSIS — Z3A30 30 weeks gestation of pregnancy: Secondary | ICD-10-CM | POA: Diagnosis not present

## 2022-12-18 DIAGNOSIS — O99012 Anemia complicating pregnancy, second trimester: Secondary | ICD-10-CM | POA: Diagnosis not present

## 2022-12-18 DIAGNOSIS — O26892 Other specified pregnancy related conditions, second trimester: Secondary | ICD-10-CM

## 2022-12-18 DIAGNOSIS — B3731 Acute candidiasis of vulva and vagina: Secondary | ICD-10-CM | POA: Diagnosis not present

## 2022-12-18 DIAGNOSIS — Z3A27 27 weeks gestation of pregnancy: Secondary | ICD-10-CM | POA: Diagnosis not present

## 2022-12-18 DIAGNOSIS — O34219 Maternal care for unspecified type scar from previous cesarean delivery: Secondary | ICD-10-CM | POA: Diagnosis not present

## 2022-12-18 DIAGNOSIS — O321XX0 Maternal care for breech presentation, not applicable or unspecified: Secondary | ICD-10-CM | POA: Diagnosis not present

## 2022-12-18 DIAGNOSIS — O0992 Supervision of high risk pregnancy, unspecified, second trimester: Secondary | ICD-10-CM | POA: Diagnosis not present

## 2022-12-18 DIAGNOSIS — O09523 Supervision of elderly multigravida, third trimester: Secondary | ICD-10-CM | POA: Diagnosis not present

## 2022-12-18 DIAGNOSIS — O3433 Maternal care for cervical incompetence, third trimester: Secondary | ICD-10-CM | POA: Diagnosis not present

## 2022-12-18 DIAGNOSIS — Z3A29 29 weeks gestation of pregnancy: Secondary | ICD-10-CM | POA: Diagnosis not present

## 2022-12-18 LAB — CBC
HCT: 33.6 % — ABNORMAL LOW (ref 36.0–46.0)
Hemoglobin: 10.8 g/dL — ABNORMAL LOW (ref 12.0–15.0)
MCH: 23.3 pg — ABNORMAL LOW (ref 26.0–34.0)
MCHC: 32.1 g/dL (ref 30.0–36.0)
MCV: 72.6 fL — ABNORMAL LOW (ref 80.0–100.0)
Platelets: 257 10*3/uL (ref 150–400)
RBC: 4.63 MIL/uL (ref 3.87–5.11)
RDW: 14.8 % (ref 11.5–15.5)
WBC: 9.1 10*3/uL (ref 4.0–10.5)
nRBC: 0 % (ref 0.0–0.2)

## 2022-12-18 LAB — AMNISURE RUPTURE OF MEMBRANE (ROM) NOT AT ARMC: Amnisure ROM: POSITIVE

## 2022-12-18 LAB — GC/CHLAMYDIA PROBE AMP (~~LOC~~) NOT AT ARMC
Chlamydia: NEGATIVE
Comment: NEGATIVE
Comment: NORMAL
Neisseria Gonorrhea: NEGATIVE

## 2022-12-18 LAB — TYPE AND SCREEN
ABO/RH(D): O POS
Antibody Screen: NEGATIVE

## 2022-12-18 LAB — WET PREP, GENITAL
Sperm: NONE SEEN
Trich, Wet Prep: NONE SEEN
WBC, Wet Prep HPF POC: >=10 — AB (ref <10)
Yeast Wet Prep HPF POC: NONE SEEN

## 2022-12-18 LAB — FETAL FIBRONECTIN: Fetal Fibronectin: POSITIVE — AB

## 2022-12-18 MED ORDER — ACETAMINOPHEN 500 MG PO TABS: 1000.0000 mg | ORAL_TABLET | Freq: Four times a day (QID) | ORAL | Status: DC | PRN

## 2022-12-18 MED ORDER — SODIUM CHLORIDE 0.9 % IV SOLN
2.0000 g | Freq: Four times a day (QID) | INTRAVENOUS | Status: AC
  Administered 2022-12-18 – 2022-12-19 (×8): 2 g via INTRAVENOUS
  Filled 2022-12-18 (×8): qty 2000

## 2022-12-18 MED ORDER — AZITHROMYCIN 250 MG PO TABS
1000.0000 mg | ORAL_TABLET | Freq: Once | ORAL | Status: AC
  Administered 2022-12-18: 1000 mg via ORAL
  Filled 2022-12-18: qty 4

## 2022-12-18 MED ORDER — DOCUSATE SODIUM 100 MG PO CAPS
100.0000 mg | ORAL_CAPSULE | Freq: Every day | ORAL | Status: DC
  Administered 2022-12-18 – 2023-01-23 (×37): 100 mg via ORAL
  Filled 2022-12-18 (×38): qty 1

## 2022-12-18 MED ORDER — SENNA 8.6 MG PO TABS: 1.0000 | ORAL_TABLET | Freq: Every day | ORAL | Status: DC | PRN

## 2022-12-18 MED ORDER — CYCLOBENZAPRINE HCL 10 MG PO TABS: 10.0000 mg | ORAL_TABLET | Freq: Three times a day (TID) | ORAL | Status: DC | PRN

## 2022-12-18 MED ORDER — AMOXICILLIN 500 MG PO CAPS
500.0000 mg | ORAL_CAPSULE | Freq: Three times a day (TID) | ORAL | Status: AC
  Administered 2022-12-20 – 2022-12-24 (×15): 500 mg via ORAL
  Filled 2022-12-18 (×15): qty 1

## 2022-12-18 MED ORDER — LACTATED RINGERS IV SOLN: 125.0000 mL/h | INTRAVENOUS | Status: AC

## 2022-12-18 MED ORDER — CALCIUM CARBONATE ANTACID 500 MG PO CHEW
2.0000 | CHEWABLE_TABLET | ORAL | Status: DC | PRN
  Administered 2022-12-18 – 2023-01-18 (×6): 400 mg via ORAL
  Filled 2022-12-18 (×7): qty 2

## 2022-12-18 MED ORDER — ENOXAPARIN SODIUM 40 MG/0.4ML IJ SOSY
40.0000 mg | PREFILLED_SYRINGE | INTRAMUSCULAR | Status: DC
  Filled 2022-12-18 (×3): qty 0.4

## 2022-12-18 MED ORDER — PRENATAL MULTIVITAMIN CH
1.0000 | ORAL_TABLET | Freq: Every day | ORAL | Status: DC
  Administered 2022-12-18 – 2023-01-14 (×21): 1 via ORAL
  Filled 2022-12-18 (×26): qty 1

## 2022-12-18 MED ORDER — ASPIRIN 81 MG PO CHEW
162.0000 mg | CHEWABLE_TABLET | Freq: Every day | ORAL | Status: DC
  Administered 2022-12-18 – 2023-01-19 (×33): 162 mg via ORAL
  Filled 2022-12-18 (×34): qty 2

## 2022-12-18 MED ORDER — BETAMETHASONE SOD PHOS & ACET 6 (3-3) MG/ML IJ SUSP
12.0000 mg | INTRAMUSCULAR | Status: AC
  Administered 2022-12-18 – 2022-12-19 (×2): 12 mg via INTRAMUSCULAR
  Filled 2022-12-18: qty 5

## 2022-12-18 MED ORDER — POLYETHYLENE GLYCOL 3350 17 G PO PACK
17.0000 g | PACK | Freq: Every day | ORAL | Status: DC | PRN
  Administered 2022-12-18 – 2023-01-16 (×17): 17 g via ORAL
  Filled 2022-12-18 (×19): qty 1

## 2022-12-18 NOTE — Consult Note (Signed)
MFM Note  Rebecca Webster is a 42 year old gravida 10 para 6 currently at 25 weeks and 5 days.  She was admitted due to PPROM which was confirmed with a positive AmniSure test.    The patient reports that she presented to the hospital last evening after she thought passed her mucous plug about half an hour after inserting the vaginal progesterone into her vagina.    On admission, her AmniSure test along with FFN test were positive.  Her cervix appeared visually closed with the cerclage in place. The patient denied any leakage of fluid and has not leaked any fluid since being admitted to the hospital.  Her pregnancy has been complicated by a history of cervical incompetence.  She had a cerclage placed earlier in her current pregnancy.  A dynamic cervix where her cervical length varied from normal to shortened was noted about 3 weeks ago.    Due to the dynamic cervix, she was given a course of Indocin about 3 weeks ago and was advised to take Procardia as needed for contractions.  She was continued on daily vaginal progesterone.    Due to PPROM, she was given a course of antenatal corticosteroids and started on latency antibiotics.  An ultrasound exam performed today shows that the fetus is in the vertex presentation.  The overall EFW is 1 pounds 15 ounces (50th percentile).  There was normal amniotic fluid with a maximal vertical pocket of 6.1 cm.  The following were discussed during today's consultation:  Possible PPROM with cerclage in place  The usual management and implications of PPROM were discussed with the patient.    As the patient is not leaking any fluid and there is normal amniotic fluid noted on her ultrasound exam, there is a possibility that her AmniSure test was a false positive.    She was advised that we will watch her here in the hospital at least until the end of the week.    Should she continue to not have any leakage of fluid and her amniotic fluid level remains within  normal limits, a repeat rupture of membrane check with the ferning test should be performed at that time to determine if she has truly ruptured membranes.  She was advised that should rupture of membranes be confirmed either through her reports of leakage of fluid or the ferning test is positive, she will require inpatient management until delivery with daily fetal testing.    She was advised that as she has a dynamic cervix with a positive FFN, she is still at increased risk for preterm delivery and therefore it is reasonable for her to receive a complete course of antenatal corticosteroids.  Magnesium sulfate for fetal neuroprotection should be given if she is at risk for delivery prior to 32 weeks.    She should complete a 7-day course of latency antibiotics.  As she is currently stable and not leaking any fluid, I would keep her cerclage stitch in for now.    Her cerclage stitch should be removed for delivery, should she have frequent contractions, or should she show any signs of an intrauterine infection.  Should PPROM be confirmed, delivery is recommended at around 34 weeks.    However should PPROM be ruled out, outpatient management with close follow-up may be pursued.  As she may have ruptured membranes, I would hold Procardia for now.  The patient has also requested that Lovenox be held for now unless she will require prolonged hospitalization.  I will reassess  the patient later this week and will perform another ultrasound on Friday to assess the amniotic fluid levels. We will make a determination regarding her PPROM status at that time.  At the end of the consultation, the patient stated that all her questions had been answered to her complete satisfaction.

## 2022-12-18 NOTE — H&P (Addendum)
ANTEPARTUM ADMISSION HISTORY AND PHYSICAL NOTE  Rebecca Webster is a 42 y.o. female [phone] with IUP at [redacted]w[redacted]d by LMP presenting for PPROM 12/18/2022 at 2200 hours. She reports positive fetal movement. She denies vaginal bleeding or contractions.   Patient also reports lower abdominal "burning", recurrent since placement of her cerclage. Pain is sometimes suprapubic and sometimes LLQ radiating across the bottom of her abdomen. She states she has mentioned this in both the office and previous MAU encounters and been told they don't know the cause. She believes it is "related to the colon" as she has had significant constipation this pregnancy. She has incorporated Miralax and Colace and is able to achieve regular bowel movements. Most recent bowel movement today.  She plans on breast feeding.   Patient endorses pelvic rest and has not had sexual intercourse since prior to her cerclage placement.  Prenatal History/Complications: PNC at [facility]:  @[redacted]w[redacted]d , CWD, normal anatomy, variable presentation, anterior placenta, 89%ile, EFW 640g  Pregnancy complications:  - AMA - Hx McDonald cerclage placement in current pregnancy - Hx previable PPROM - Hx Gestational Hypertension - Hx cesarean birth - Hx VBAC  Past Medical History: Past Medical History:  Diagnosis Date   Anemia    not currently anemic   Gestational HTN    Incompetent cervix     Past Surgical History: Past Surgical History:  Procedure Laterality Date   ABDOMINOPLASTY     CERVICAL CERCLAGE N/A 05/06/2019   Procedure: CERCLAGE CERVICAL;  Surgeon: Maxie Better, MD;  Location: [facility] LD [facility]S;  Service: Gynecology;  Laterality: N/A;   CERVICAL CERCLAGE N/A 09/29/2019   Procedure: CERCLAGE CERVICAL;  Surgeon: Maxie Better, MD;  Location: [facility] LD ORS;  Service: Gynecology;  Laterality: N/A;  EDD: 03/28/20   CERVICAL CERCLAGE N/A 09/22/2022   Procedure: CERCLAGE CERVICAL;  Surgeon: Lennart Pall, MD;  Location:  [facility] LD ORS;  Service: Gynecology;  Laterality: N/A;   CESAREAN SECTION  2001   Reversal of tubal ligation     TUBAL LIGATION      Obstetrical History: OB History     Gravida  10   Para  5   Term  3   Preterm  2   AB  4   Living  6      SAB  3   IAB      Ectopic  1   Multiple  1   Live Births  6           Social History: Social History   Socioeconomic History   Marital status: Married    Spouse name: Not on file   Number of children: Not on file   Years of education: Not on file   Highest education level: Not on file  Occupational History   Not on file  Tobacco Use   Smoking status: Never   Smokeless tobacco: Never  Vaping Use   Vaping Use: Never used  Substance and Sexual Activity   Alcohol use: No   Drug use: No   Sexual activity: Not Currently    Birth control/protection: None  Other Topics Concern   Not on file  Social History Narrative   Not on file   Social Determinants of Health   Financial Resource Strain: Not on file  Food Insecurity: Not on file  Transportation Needs: Not on file  Physical Activity: Not on file  Stress: Not on file  Social Connections: Not on file    Family History: Family History  Problem Relation Age of Onset   Heart disease Maternal Grandmother    Stroke Maternal Grandmother    Cancer Father        prostate    Allergies: No Known Allergies  Medications Prior to Admission  Medication Sig Dispense Refill Last Dose   aspirin 81 MG chewable tablet Chew 2 tablets (162 mg total) by mouth daily. 60 tablet 7 12/17/2022 at 2030   NIFEdipine (PROCARDIA-XL/NIFEDICAL-XL) 30 MG 24 hr tablet Take 1 tablet (30 mg total) by mouth daily. 30 tablet 6 12/17/2022 at 0930   progesterone (PROMETRIUM) 200 MG capsule Take 1 capsule (200 mg total) by mouth daily. 30 capsule 6 12/17/2022 at 2030   Ascorbic Acid (VITAMIN C PO) Take 1 tablet by mouth 2 (two) times a week.      ferrous gluconate (FERGON) 324 MG tablet Take 1  tablet (324 mg total) by mouth every other day. (Patient not taking: Reported on 11/28/2022) 45 tablet 2    indomethacin (INDOCIN) 25 MG capsule Take 50mg  (2 tabs) now then in 6 hours take 25mg  every 6 hours for 48hours (Patient not taking: Reported on 12/06/2022) 10 capsule 0    polyethylene glycol powder (GLYCOLAX/MIRALAX) 17 GM/SCOOP powder Take 1 Container by mouth once.      Prenat-Fe Poly-Methfol-FA-DHA (VITAFOL ULTRA) 29-0.6-0.4-200 MG CAPS Take 1 capsule by mouth daily. 30 capsule 12    Probiotic Product (PROBIOTIC PO) Take 1 capsule by mouth in the morning. [employer] Dr. Gillermina Phy Women's Probiotics        Review of Systems  All systems reviewed and negative except as stated in HPI  Physical Exam Blood pressure 134/83, pulse 98, temperature 98.1 F (36.7 C), temperature source Oral, resp. rate 16, height 5\' 10"  (1.778 m), weight 110.7 kg, last menstrual period 06/21/2022, unknown if currently breastfeeding. General appearance: alert, oriented Lungs: normal respiratory effort Heart: regular rate Abdomen: soft, non-tender; gravid Extremities: No calf swelling or tenderness Pelvic exam: External genitalia normal, vaginal walls pink and well rugated, cervix visually closed, no lesions noted. McDonald suture visible. Thin white discharge visible at introitus and throughout vaginal vault. Small clumps of discharge visible on surface of cervix.  Fetal monitoring: Reassuring fetal surveillance for gestational age Uterine activity: none   Prenatal labs: ABO, Rh: O/Positive/-- (02/07 1619) Antibody: Negative (02/07 1619) Rubella: 1.67 (02/07 1619) RPR: Non Reactive (02/07 1619)  HBsAg: Negative (02/07 1619)  HIV: Non Reactive (02/07 1619)  GC/Chlamydia: Neg  GBS:   Unknown 2-hr GTT: Unknown Genetic screening:  Low risk   Prenatal Transfer Tool  Maternal Diabetes: No Genetic Screening: Normal Maternal Ultrasounds/Referrals: Normal Fetal Ultrasounds or other Referrals:   Other: for cerclage placement Maternal Substance Abuse:  No Significant Maternal Medications:  None Significant Maternal Lab Results: None  Results for orders placed or performed during the hospital encounter of 12/17/22 (from the past 24 hour(s))  Urinalysis, Routine w reflex microscopic -Urine, Clean Catch   Collection Time: 12/17/22 11:29 PM  Result Value Ref Range   Color, Urine STRAW (A) YELLOW   APPearance CLEAR CLEAR   Specific Gravity, Urine 1.006 1.005 - 1.030   pH 7.0 5.0 - 8.0   Glucose, UA NEGATIVE NEGATIVE mg/dL   Hgb urine dipstick SMALL (A) NEGATIVE   Bilirubin Urine NEGATIVE NEGATIVE   Ketones, ur NEGATIVE NEGATIVE mg/dL   Protein, ur NEGATIVE NEGATIVE mg/dL   Nitrite NEGATIVE NEGATIVE   Leukocytes,Ua NEGATIVE NEGATIVE   RBC / HPF 0-5 0 - 5 RBC/hpf  WBC, UA 0-5 0 - 5 WBC/hpf   Bacteria, UA RARE (A) NONE SEEN   Squamous Epithelial / HPF 0-5 0 - 5 /HPF  Wet prep, genital   Collection Time: 12/17/22 11:50 PM  Result Value Ref Range   Yeast Wet Prep HPF POC NONE SEEN NONE SEEN   Trich, Wet Prep NONE SEEN NONE SEEN   Clue Cells Wet Prep HPF POC PRESENT (A) NONE SEEN   WBC, Wet Prep HPF POC >=10 (A) <10   Sperm NONE SEEN   Amnisure rupture of membrane (rom)not at [facility]   Collection Time: 12/17/22 11:50 PM  Result Value Ref Range   Amnisure ROM POSITIVE     Patient Active Problem List   Diagnosis Date Noted   Cervical insufficiency during pregnancy in second trimester, antepartum 09/22/2022   AMA (advanced maternal age) multigravida 35+ 09/12/2022   Supervision of high-risk pregnancy 08/20/2022   History of vaginal delivery following previous cesarean delivery 08/20/2022   History of gestational hypertension 08/20/2022   History of preterm premature rupture of membranes 08/20/2022   History of cervical cerclage 09/29/2019    Assessment: -ACE HOCUTT is a 42 y.o. [id number] at [redacted]w[redacted]d here for PPROM at 2200 hours this evening (Amnisure positive) - McDonald  cerclage in place - No contractions on toco or reported by patient - Per Dr. Berton Lan, admit to [facility] for latency antibiotics  Calvert Cantor, MSA, MSN, CNM 12/18/2022, 12:50 AM

## 2022-12-19 DIAGNOSIS — O42912 Preterm premature rupture of membranes, unspecified as to length of time between rupture and onset of labor, second trimester: Secondary | ICD-10-CM | POA: Diagnosis not present

## 2022-12-19 DIAGNOSIS — Z3A25 25 weeks gestation of pregnancy: Secondary | ICD-10-CM | POA: Diagnosis not present

## 2022-12-19 LAB — CULTURE, OB URINE: Culture: NO GROWTH

## 2022-12-19 NOTE — Progress Notes (Signed)
Patient ID: Rebecca Webster, female   DOB: Jul 27, 1981, 42 y.o.   MRN: [id number] FACULTY PRACTICE ANTEPARTUM(COMPREHENSIVE) NOTE  Rebecca Webster is a 42 y.o. [id number] at [redacted]w[redacted]d by LMP who is admitted for rupture of membranes.   Fetal presentation is cephalic. Length of Stay:  1  Days  Subjective: No leaking Patient reports the fetal movement as active. Patient reports uterine contraction  activity as none. Patient reports  vaginal bleeding as none. Patient describes fluid per vagina as None.  Vitals:  Blood pressure 129/76, pulse 95, temperature 97.8 F (36.6 C), temperature source Oral, resp. rate 18, height 5\' 10"  (1.778 m), weight 110.7 kg, last menstrual period 06/21/2022, SpO2 100 %, unknown if currently breastfeeding. Physical Examination:  General appearance - alert, well appearing, and in no distress Heart - normal rate and regular rhythm Abdomen - soft, nontender, nondistended Fundal Height:  size equals dates Cervical Exam: Not evaluated. Extremities: extremities normal, atraumatic, no cyanosis or edema and Homans sign is negative, no sign of DVT    Fetal Monitoring:  Baseline 150 no decels positive accelerations  Labs:  No results found for this or any previous visit (from the past 24 hour(s)).   Medications:  Scheduled  [START ON 12/20/2022] amoxicillin  500 mg Oral TID   aspirin  162 mg Oral Daily   docusate sodium  100 mg Oral Daily   enoxaparin (LOVENOX) injection  40 mg Subcutaneous Q24H   prenatal multivitamin  1 tablet Oral Q1200   I have reviewed the patient's current medications.  ASSESSMENT: Patient Active Problem List   Diagnosis Date Noted   Preterm premature rupture of membranes (PPROM) with unknown onset of labor 12/18/2022   Cervical cerclage suture present 12/18/2022   Cervical insufficiency during pregnancy in second trimester, antepartum 09/22/2022   AMA (advanced maternal age) multigravida 35+ 09/12/2022   Supervision of high-risk pregnancy  08/20/2022   History of vaginal delivery following previous cesarean delivery 08/20/2022   History of gestational hypertension 08/20/2022   History of preterm premature rupture of membranes 08/20/2022   History of cervical cerclage 09/29/2019    PLAN: Per Dr. Loretta Plume note, reevaluate on Friday if no clear signs of more leaking prior  Rebecca Webster 12/19/2022,11:10 AM

## 2022-12-20 DIAGNOSIS — O42912 Preterm premature rupture of membranes, unspecified as to length of time between rupture and onset of labor, second trimester: Secondary | ICD-10-CM | POA: Diagnosis not present

## 2022-12-20 DIAGNOSIS — Z3A26 26 weeks gestation of pregnancy: Secondary | ICD-10-CM | POA: Diagnosis not present

## 2022-12-20 LAB — CULTURE, BETA STREP (GROUP B ONLY)

## 2022-12-20 MED ORDER — METRONIDAZOLE 500 MG PO TABS
500.0000 mg | ORAL_TABLET | Freq: Two times a day (BID) | ORAL | Status: AC
  Administered 2022-12-20 – 2022-12-26 (×14): 500 mg via ORAL
  Filled 2022-12-20 (×14): qty 1

## 2022-12-20 NOTE — Progress Notes (Signed)
Daily Antepartum Note  Admission Date: 12/17/2022 Current Date: 12/20/2022 11:11 AM  Rebecca Webster is a 42 y.o. [id number] at [redacted]w[redacted]d, admitted for PPROM.  Pregnancy complicated by: Patient Active Problem List   Diagnosis Date Noted   Preterm premature rupture of membranes (PPROM) with unknown onset of labor 12/18/2022   Cervical cerclage suture present 12/18/2022   Cervical insufficiency during pregnancy in second trimester, antepartum 09/22/2022   AMA (advanced maternal age) multigravida 35+ 09/12/2022   Supervision of high-risk pregnancy 08/20/2022   History of vaginal delivery following previous cesarean delivery 08/20/2022   History of gestational hypertension 08/20/2022   History of preterm premature rupture of membranes 08/20/2022   History of cervical cerclage 09/29/2019    Overnight/24hr events:  none  Subjective:  No OB s/s.   Objective:    Current Vital Signs 24h Vital Sign Ranges  T 98.3 F (36.8 C) Temp  Avg: 98.5 F (36.9 C)  Min: 97.9 F (36.6 C)  Max: 98.9 F (37.2 C)  BP 118/69 BP  Min: 118/69  Max: 135/68  HR 93 Pulse  Avg: 93.4  Min: 92  Max: 96  RR 18 Resp  Avg: 18  Min: 17  Max: 19  SaO2 100 % Room Air SpO2  Avg: 99.3 %  Min: 98 %  Max: 100 %       24 Hour I/O Current Shift I/O  Time Ins Outs 05/15 0701 - 05/16 0700 In: 700  Out: -  No intake/output data recorded.   Fetal Heart Tones: 155 baseline, no accel or decel, min to mod variability Tocometry: toco quiet x 97m  Physical exam: General: Well nourished, well developed female in no acute distress. Abdomen: gravid nttp Respiratory: No respiratory distress Extremities: no clubbing, cyanosis or edema Skin: Warm and dry.   Medications: Current Facility-Administered Medications  Medication Dose Route Frequency Provider Last Rate Last Admin   acetaminophen (TYLENOL) tablet 1,000 mg  1,000 mg Oral Q6H PRN Harvie Bridge C, MD       amoxicillin (AMOXIL) capsule 500 mg  500 mg Oral TID  Harvie Bridge C, MD   500 mg at 12/20/22 1052   aspirin chewable tablet 162 mg  162 mg Oral Daily Lennart Pall, MD   162 mg at 12/20/22 1054   calcium carbonate (TUMS - dosed in mg elemental calcium) chewable tablet 400 mg of elemental calcium  2 tablet Oral Q4H PRN Harvie Bridge C, MD   400 mg of elemental calcium at 12/18/22 0636   cyclobenzaprine (FLEXERIL) tablet 10 mg  10 mg Oral TID PRN Lennart Pall, MD       docusate sodium (COLACE) capsule 100 mg  100 mg Oral Daily Harvie Bridge C, MD   100 mg at 12/20/22 1053   enoxaparin (LOVENOX) injection 40 mg  40 mg Subcutaneous Q24H Harvie Bridge C, MD       polyethylene glycol (MIRALAX / GLYCOLAX) packet 17 g  17 g Oral Daily PRN Lennart Pall, MD   17 g at 12/19/22 1612   prenatal multivitamin tablet 1 tablet  1 tablet Oral Q1200 Lennart Pall, MD   1 tablet at 12/18/22 1034   senna (SENOKOT) tablet 8.6 mg  1 tablet Oral Daily PRN Lennart Pall, MD        Labs:  No new labs  Radiology:  No new imaging 5/14: cephalic, 50%, efw 874g, ac 60%, afi wnl  Assessment & Plan:  Patient stable  *Pregnancy: EFM  appropriate for gestational age *PPROM: continue latency abx. MFM said to consider re-testing amnisure and FFN. Can finish out latency abx and consider re-testing if AFI still wnl. Flagyl x 7d ordered for BV *Preterm: needs NICU consult. Mg PRN -s/p bmz on 5/14 and 5/15.  *Cerclage: prophylactic mcdonald cerclage with ticron stitch still in place. Remove for any s/s of labor or need for delivery.  H/o GHTN: continue low dose ASA *AMA: no issues *h/o VBAC: consent signed 5/16 *PPx: SCDs, OOB ad lib *Dispo: inpatient since likely PPROM.   Cornelia Copa MD Attending Center for [facility] (Faculty [facility]) GYN Consult Phone: [phone] (M-F, [phone]) & [phone]  (Off hours, weekends, holidays)

## 2022-12-21 ENCOUNTER — Inpatient Hospital Stay (HOSPITAL_BASED_OUTPATIENT_CLINIC_OR_DEPARTMENT_OTHER): Payer: Medicaid Other

## 2022-12-21 DIAGNOSIS — O42919 Preterm premature rupture of membranes, unspecified as to length of time between rupture and onset of labor, unspecified trimester: Secondary | ICD-10-CM

## 2022-12-21 DIAGNOSIS — Z3A26 26 weeks gestation of pregnancy: Secondary | ICD-10-CM

## 2022-12-21 DIAGNOSIS — O09292 Supervision of pregnancy with other poor reproductive or obstetric history, second trimester: Secondary | ICD-10-CM

## 2022-12-21 DIAGNOSIS — O26892 Other specified pregnancy related conditions, second trimester: Secondary | ICD-10-CM | POA: Diagnosis not present

## 2022-12-21 DIAGNOSIS — O09212 Supervision of pregnancy with history of pre-term labor, second trimester: Secondary | ICD-10-CM

## 2022-12-21 DIAGNOSIS — O0992 Supervision of high risk pregnancy, unspecified, second trimester: Secondary | ICD-10-CM | POA: Diagnosis not present

## 2022-12-21 DIAGNOSIS — O3432 Maternal care for cervical incompetence, second trimester: Secondary | ICD-10-CM | POA: Diagnosis not present

## 2022-12-21 DIAGNOSIS — O42912 Preterm premature rupture of membranes, unspecified as to length of time between rupture and onset of labor, second trimester: Secondary | ICD-10-CM

## 2022-12-21 DIAGNOSIS — O09522 Supervision of elderly multigravida, second trimester: Secondary | ICD-10-CM

## 2022-12-21 LAB — CBC
HCT: 28.8 % — ABNORMAL LOW (ref 36.0–46.0)
Hemoglobin: 9.1 g/dL — ABNORMAL LOW (ref 12.0–15.0)
MCH: 22.8 pg — ABNORMAL LOW (ref 26.0–34.0)
MCHC: 31.6 g/dL (ref 30.0–36.0)
MCV: 72.2 fL — ABNORMAL LOW (ref 80.0–100.0)
Platelets: 233 10*3/uL (ref 150–400)
RBC: 3.99 MIL/uL (ref 3.87–5.11)
RDW: 14.8 % (ref 11.5–15.5)
WBC: 11.5 10*3/uL — ABNORMAL HIGH (ref 4.0–10.5)
nRBC: 0.3 % — ABNORMAL HIGH (ref 0.0–0.2)

## 2022-12-21 LAB — TYPE AND SCREEN
ABO/RH(D): O POS
Antibody Screen: NEGATIVE

## 2022-12-21 MED ORDER — FERROUS SULFATE 325 (65 FE) MG PO TABS
325.0000 mg | ORAL_TABLET | ORAL | Status: DC
  Administered 2022-12-22 – 2022-12-28 (×4): 325 mg via ORAL
  Filled 2022-12-21 (×4): qty 1

## 2022-12-21 MED ORDER — FLUCONAZOLE 150 MG PO TABS
150.0000 mg | ORAL_TABLET | Freq: Once | ORAL | Status: AC
  Administered 2022-12-21: 150 mg via ORAL
  Filled 2022-12-21: qty 1

## 2022-12-21 NOTE — Consult Note (Signed)
Consultation Service: Neonatology   Dr. Vergie Living has asked for consultation on Rebecca Webster regarding the care of a premature infant at [redacted]w[redacted]d. Thank you for inviting [place] to see this patient.   Reason for consult:  Explain the possible complications, the prognosis, and the care of a premature infant at 28 and 1/7 weeks.  Chief complaint: 42 y.o. female with a singleton IUP with an estimated weight of 874 grams (last 5/14). Pregnancy has been complicated by PPROM and cervical change s/p cerclage placement.  Plan is for delivery via ceasarean section.  My key findings of this patient's HPI are:  I have reviewed the patient's chart and have met with her. The salient information is as follows:   Mom is admitted to L&D for PPROM rule in by +amnisure and FFN was also positive. Most recent US showed fluid still present so provided BMZ on latency antibiotics and monitoring closely. Monitoring for  and fetal/maternal distress with no concerns at this time.   Prenatal labs:   Prenatal care:   good Pregnancy complications:  PPROM and prior history of 33 week twins and 20 week loss Maternal antibiotics: This patient's mother is not on file. Maternal Steroids: yes Most recent dose:  5/15   My recommendations for this patient and my actions included:   1. In the presence of the [name] , I spent 30 minutes discussing the possible complications and outcomes of prematurity at this gestational age. I discussed specific complications at this gestational age referencing the need for resuscitation at birth due to respiratory distress which may require mechanical ventilation, CPAP, and surfactant administration. In addition infant may require IV fluids pending establishment of enteral feeds (encouraged breast milk feeding), antibiotics for possible sepsis, temperature support, and continuous monitoring. I also discussed the potential risk of complications such as intracranial hemorrhage,  retinopathy, hearing deficit, and chronic lung disease. I discussed this with parents in detail and they expressed an understanding of the risks and complications of prematurity.   2. I also discussed the expected survival of an infant born at 75 weeks, which is (Good to Moderate). We further discussed that (Few) of the neonates born at this age have profound or severe neurological complications and school difficulties. In addition, (Some) of the neonates born at this age will have some for of mild to moderate neurological complications. She expressed an understanding of this information.   3. I informed her that the NICU team would be present at the delivery. She agreed that all appropriate medical measures could be taken to resuscitate her infant at the delivery. She also understood that our team will always be available for any questions that come up during their infant's hospitalization and we will continue to partner with their family to support them through this difficult time. Visitation policy was discussed and all questions were addressed.   Final Impression:  42 y.o. female with a singleton IUP who is threatening to deliver and who now understands the possible complications and prognosis of her infant. The mother agrees with plan for resuscitation and ICU care. Rebecca Webster's questions were answered. She is planning to try and provide breast milk for her infant.    ______________________________________________________________________  Thank you for asking [place] to participate in the care of this patient. Please do not hesitate to contact us again if you are aware of any further ways we can be of assistance.   Sincerely,  Harlow Mares, MD Attending Neonatologist   I  spent ~35 minutes in consultation time, of which 15 minutes was spent in direct face to face counseling.

## 2022-12-21 NOTE — Consult Note (Signed)
MFM Note  This patient has been hospitalized due to possible PPROM.  She is currently at 26 weeks and 1 day.  She continues to deny any leakage of fluid.  A repeat ultrasound performed this morning continues to show normal amniotic fluid with a maximal vertical pocket of 4.8 cm.  The fetus is in the transverse presentation.  As it is questionable if she has truly ruptured membranes based on the fact that she has not leaked any fluid and there is normal amniotic fluid noted on her ultrasound exam, a plan is in place to repeat her rupture membrane check once she completes her IV latency antibiotic course.  Should her repeat rupture membrane check be negative, she may be discharged home.    Should she be discharged, she should have a repeat ultrasound scheduled in our office next week to monitor the amniotic fluid level.    The patient is happy and comfortable with this plan.

## 2022-12-21 NOTE — Progress Notes (Signed)
Daily Antepartum Note  Admission Date: 12/17/2022 Current Date: 12/21/2022 1:08 PM  Rebecca Webster is a 42 y.o. [id number] at [redacted]w[redacted]d, admitted for PPROM.  Pregnancy complicated by: Patient Active Problem List   Diagnosis Date Noted   Preterm premature rupture of membranes (PPROM) with unknown onset of labor 12/18/2022   Cervical cerclage suture present 12/18/2022   Cervical insufficiency during pregnancy in second trimester, antepartum 09/22/2022   AMA (advanced maternal age) multigravida 35+ 09/12/2022   Supervision of high-risk pregnancy 08/20/2022   History of vaginal delivery following previous cesarean delivery 08/20/2022   History of gestational hypertension 08/20/2022   History of preterm premature rupture of membranes 08/20/2022   History of cervical cerclage 09/29/2019    Overnight/24hr events:  none  Subjective:  No OB s/s. No vaginitis s/s but having some white, creamy like d/c.   Objective:    Current Vital Signs 24h Vital Sign Ranges  T 97.7 F (36.5 C) Temp  Avg: 98.1 F (36.7 C)  Min: 97.7 F (36.5 C)  Max: 98.2 F (36.8 C)  BP 132/69 BP  Min: 119/63  Max: 132/69  HR 90 Pulse  Avg: 85.8  Min: 81  Max: 90  RR 18 Resp  Avg: 18.5  Min: 18  Max: 20  SaO2 100 % Room Air SpO2  Avg: 99.3 %  Min: 98 %  Max: 100 %       24 Hour I/O Current Shift I/O  Time Ins Outs No intake/output data recorded. No intake/output data recorded.   Fetal Heart Tones: 140 baseline, + accels or decel, min to mod variability Tocometry: toco quiet x 44m  Physical exam: General: Well nourished, well developed female in no acute distress. Abdomen: gravid nttp Respiratory: No respiratory distress Extremities: no clubbing, cyanosis or edema Skin: Warm and dry.   Medications: Current Facility-Administered Medications  Medication Dose Route Frequency Provider Last Rate Last Admin   acetaminophen (TYLENOL) tablet 1,000 mg  1,000 mg Oral Q6H PRN Harvie Bridge C, MD       amoxicillin  (AMOXIL) capsule 500 mg  500 mg Oral TID Harvie Bridge C, MD   500 mg at 12/21/22 8657   aspirin chewable tablet 162 mg  162 mg Oral Daily Lennart Pall, MD   162 mg at 12/21/22 0931   calcium carbonate (TUMS - dosed in mg elemental calcium) chewable tablet 400 mg of elemental calcium  2 tablet Oral Q4H PRN Harvie Bridge C, MD   400 mg of elemental calcium at 12/18/22 0636   cyclobenzaprine (FLEXERIL) tablet 10 mg  10 mg Oral TID PRN Lennart Pall, MD       docusate sodium (COLACE) capsule 100 mg  100 mg Oral Daily Harvie Bridge C, MD   100 mg at 12/21/22 0931   fluconazole (DIFLUCAN) tablet 150 mg  150 mg Oral Once Durant Bing, MD       metroNIDAZOLE (FLAGYL) tablet 500 mg  500 mg Oral Q12H Salado Bing, MD   500 mg at 12/21/22 0931   polyethylene glycol (MIRALAX / GLYCOLAX) packet 17 g  17 g Oral Daily PRN Lennart Pall, MD   17 g at 12/20/22 1131   prenatal multivitamin tablet 1 tablet  1 tablet Oral Q1200 Lennart Pall, MD   1 tablet at 12/18/22 1034   senna (SENOKOT) tablet 8.6 mg  1 tablet Oral Daily PRN Lennart Pall, MD        Labs: no new labs  Radiology:  5/17: transverse, normal af level 5/14: cephalic, 50%, efw 874g, ac 60%, afi wnl  Assessment & Plan:  Patient stable  *Pregnancy: EFM appropriate for gestational age *PPROM: continue latency abx. Plan to repeat spec and afi on Sunday. Continue flagyl D#2/7. Diflucan x 1 ordered.  Flagyl x 7d ordered for BV *Preterm: NICU called yesterday and to come by and see. Mg PRN -s/p bmz on 5/14 and 5/15.  *Cerclage: prophylactic mcdonald cerclage with ticron stitch still in place. Remove for any s/s of labor or need for delivery.  H/o GHTN: continue low dose ASA *AMA: no issues *h/o VBAC: consent signed 5/16 *PPx: SCDs, OOB ad lib *Dispo: inpatient since likely PPROM.   Cornelia Copa MD Attending Center for [facility] (Faculty [facility]) GYN Consult Phone: [phone]  (M-F, 0800-1700) & [phone]  (Off hours, weekends, holidays)

## 2022-12-21 NOTE — Progress Notes (Signed)
Patient ID: Rebecca Webster, female   DOB: 04-24-1981, 42 y.o.   MRN: [id number] FACULTY PRACTICE ANTEPARTUM(COMPREHENSIVE) NOTE  Rebecca Webster is a 42 y.o. [id number] with Estimated Date of Delivery: 03/28/23   By  early ultrasound [redacted]w[redacted]d  who is admitted for PROM.    Fetal presentation is unsure.-->last presentation by sonogram was cephalic Length of Stay:  3  Days  Date of admission:12/17/2022  Subjective: No fluid passage no contractions no complaints Patient reports the fetal movement as active. Patient reports uterine contraction  activity as none. Patient reports  vaginal bleeding as none. Patient describes fluid per vagina as None.  Vitals:  Blood pressure 119/63, pulse 89, temperature 98.2 F (36.8 C), temperature source Oral, resp. rate 18, height 5\' 10"  (1.778 m), weight 110.7 kg, last menstrual period 06/21/2022, SpO2 98 %, unknown if currently breastfeeding. Vitals:   12/20/22 0815 12/20/22 1104 12/20/22 1520 12/20/22 2331  BP: 118/69 121/71 122/68 119/63  Pulse: 93 86 88 89  Resp: 18 18 20 18   Temp: 98.4 F (36.9 C) 98.3 F (36.8 C) 98.2 F (36.8 C) 98.2 F (36.8 C)  TempSrc: Oral Oral Oral Oral  SpO2: 100% 100% 99% 98%  Weight:      Height:       Physical Examination:  General appearance - alert, well appearing, and in no distress Abdomen - soft, nontender, nondistended, no masses or organomegaly Fundal Height:  size equals dates Pelvic Exam:  examination not indicated Cervical Exam: Not evaluated. Extremities: extremities normal, atraumatic, no cyanosis or edema with DTRs 2+ bilaterally Membranes:ruptured, clear fluid  Fetal Monitoring:  Baseline: 160 bpm, Variability: Good {> 6 bpm), Accelerations: Non-reactive but appropriate for gestational age, and Decelerations: Absent   appropriate for GA  Labs:  Results for orders placed or performed during the hospital encounter of 12/17/22 (from the past 24 hour(s))  Type and screen [facility]    Collection Time: 12/21/22  1:04 AM  Result Value Ref Range   ABO/RH(D) O POS    Antibody Screen NEG    Sample Expiration      12/24/2022,2359 Performed at [facility], [address], [place], [place] [address]   CBC   Collection Time: 12/21/22  1:07 AM  Result Value Ref Range   WBC 11.5 (H) 4.0 - 10.5 K/uL   RBC 3.99 3.87 - 5.11 MIL/uL   Hemoglobin 9.1 (L) 12.0 - 15.0 g/dL   HCT 57.8 (L) 46.9 - 62.9 %   MCV 72.2 (L) 80.0 - 100.0 fL   MCH 22.8 (L) 26.0 - 34.0 pg   MCHC 31.6 30.0 - 36.0 g/dL   RDW 52.8 41.3 - 24.4 %   Platelets 233 150 - 400 K/uL   nRBC 0.3 (H) 0.0 - 0.2 %    Imaging Studies:    [place] MFM OB FOLLOW UP  Result Date: 12/18/2022 ----------------------------------------------------------------------  OBSTETRICS REPORT                       (Signed Final 12/18/2022 05:26 pm) ---------------------------------------------------------------------- Patient Info  ID #:       [id number]                          D.O.B.:  1981-07-15 (41 yrs)  Name:       Rebecca Webster [facility]                Visit Date: 12/18/2022 08:05  am ---------------------------------------------------------------------- Performed By  Attending:        Ma Rings MD         Referred By:      [facility] MAU/Triage  Performed By:     Percell Boston          [facility] ---------------------------------------------------------------------- Orders  #  Description                           Code        Ordered By  1  [place] MFM OB FOLLOW UP                   16109.60    Clayton Bibles ----------------------------------------------------------------------  #  Order #                     Accession #                Episode #  1  [id number]                   [id number]                 [id number] ---------------------------------------------------------------------- Indications  Premature  rupture of membranes - leaking       O42.90  fluid  Maternal indication (passed mucus plug         O35.8XX0  12/17/22)  Advanced maternal age multigravida 24+,        O79.522  second trimester (41 yrs)  Cervical cerclage suture present, second       O34.32  trimester (09/22/22)  Poor obstetric history: Previous preterm       O09.219  delivery, antepartum (33 wks)  Cervical incompetence, second trimester        O34.32  (Procardia)  Poor obstetric history: Previous               O09.299  preeclampsia / eclampsia/gestational HTN  Low Risk NIPS  History of cervical cerclage, currently        O09.299  pregnant  [redacted] weeks gestation of pregnancy                Z3A.25 ---------------------------------------------------------------------- Fetal Evaluation  Num Of Fetuses:         1  Fetal Heart Rate(bpm):  158  Cardiac Activity:       Observed  Presentation:           Cephalic  Placenta:               Anterior  P. Cord Insertion:      Visualized, central  Amniotic Fluid  AFI FV:      Within normal limits  Largest Pocket(cm)                              4.7 ---------------------------------------------------------------------- Biometry  BPD:      68.5  mm     G. Age:  27w 4d         92  %    CI:        85.59   %    70 - 86                                                          FL/HC:      19.9   %    18.6 - 20.4  HC:      233.2  mm     G. Age:  25w 3d         16  %    HC/AC:      1.07        1.04 - 1.22  AC:      218.4  mm     G. Age:  26w 2d         60  %    FL/BPD:     67.6   %    71 - 87  FL:       46.3  mm     G. Age:  25w 3d         26  %    FL/AC:      21.2   %    20 - 24  Est. FW:     874  gm    1 lb 15 oz      50  % ---------------------------------------------------------------------- OB History  Gravidity:    10        Term:   3        Prem:   2        SAB:   3  Ectopic:      1        Living:  6 ---------------------------------------------------------------------- Gestational Age  LMP:            25w 5d        Date:  06/21/22                 EDD:   03/28/23  U/S Today:     26w 1d                                        EDD:   03/25/23  Best:          25w 5d     Det. By:  LMP  (06/21/22)          EDD:   03/28/23 ---------------------------------------------------------------------- Anatomy  Cranium:               Appears normal         LVOT:                   Appears normal  Cavum:                 Previously seen  Aortic Arch:            Not well visualized  Ventricles:            Appears normal         Ductal Arch:            Not well visualized  Choroid Plexus:        Previously seen        Diaphragm:              Appears normal  Cerebellum:            Previously seen        Stomach:                Appears normal, left                                                                        sided  Posterior Fossa:       Previously seen        Abdomen:                Previously seen  Nuchal Fold:           Not applicable (>20    Abdominal Wall:         Previously seen                         wks GA)  Face:                  Appears normal         Cord Vessels:           Previously seen                         (orbits and profile)  Lips:                  Appears normal         Kidneys:                Previously seen  Palate:                Appears normal         Bladder:                Appears normal  Thoracic:              Appears normal         Spine:                  Previously seen  Heart:                 Not well visualized    Upper Extremities:      Previously seen  RVOT:                  Previously seen        Lower Extremities:      Previously seen  Other:  Fetus appears to be a female. Technically difficult due to maternal          habitus and fetal position. ----------------------------------------------------------------------  Cervix Uterus Adnexa  Cervix  Not visualized (advanced GA >24wks)  Uterus  No abnormality visualized.  Right Ovary  No adnexal mass visualized.  Left Ovary  No adnexal  mass visualized.  Cul De Sac  No free fluid seen.  Adnexa  No abnormality visualized ---------------------------------------------------------------------- Comments  Rebecca Webster is a 42 year old gravida 10 para 6 currently at  25 weeks and 5 days.  She was admitted due to PPROM  which was confirmed with a positive AmniSure test.  The patient reports that she presented to the hospital last  evening after she thought passed her mucous plug about half  an hour after inserting the vaginal progesterone into her  vagina.  On admission, her AmniSure test along with FFN test were  positive.  Her cervix appeared visually closed with the  cerclage in place. The patient denied any leakage of fluid and  has not leaked any fluid since being admitted to the hospital.  Her pregnancy has been complicated by a history of cervical  incompetence.  She had a cerclage placed earlier in her  current pregnancy.  A dynamic cervix where her cervical  length varied from normal to shortened was noted about 3  weeks ago.  Due to the dynamic cervix, she was given a course of Indocin  about 3 weeks ago and was advised to take Procardia as  needed for contractions.  She was continued on daily vaginal  progesterone.  Due to PPROM, she was given a course of antenatal  corticosteroids and started on latency antibiotics.  An ultrasound exam performed today shows that the fetus is  in the vertex presentation.  The overall EFW is 1 pounds 15  ounces (50th percentile).  There was normal amniotic fluid  with a maximal vertical pocket of 6.1 cm.  The following were discussed during today's consultation:  Possible PPROM with cerclage in place  The usual management and implications of PPROM were  discussed with the patient.  As the patient is not leaking any fluid and there is normal  amniotic fluid noted on her ultrasound exam, there is a  possibility that her AmniSure test was a false positive.  She was advised that we will watch her here in the  hospital at  least until the end of the week.  Should she continue to not have any leakage of fluid and her  amniotic fluid level remains within normal limits, a repeat  rupture of membrane check with the ferning test should be  performed at that time to determine if she has truly ruptured  membranes.  She was advised that should rupture of membranes be  confirmed either through her reports of leakage of fluid or the  ferning test is positive, she will require inpatient management  until delivery with daily fetal testing.  She was advised that as she has a dynamic cervix with a  positive FFN, she is still at increased risk for preterm delivery  and therefore it is reasonable for her to receive a complete  course of antenatal corticosteroids.  Magnesium sulfate for fetal neuroprotection should be given if  she is at risk for delivery prior to 32 weeks.  She should complete a 7-day course of latency antibiotics.  As she is currently stable and not leaking any fluid, I would  keep her cerclage stitch in for now.  Her cerclage stitch should be removed for delivery, should  she have frequent contractions, or should she show any  signs of an  intrauterine infection.  Should PPROM be confirmed, delivery is recommended at  around 34 weeks.  However should PPROM be ruled out, outpatient  management with close follow-up may be pursued.  As she may have ruptured membranes, I would hold  Procardia for now.  The patient has also requested that Lovenox be held for now  unless she will require prolonged hospitalization.  I will reassess the patient later this week and will perform  another ultrasound on Friday to assess the amniotic fluid  levels. We will make a determination regarding her PPROM  status at that time.  At the end of the consultation, the patient stated that all her  questions had been answered to her complete satisfaction. ----------------------------------------------------------------------                    Ma Rings, MD Electronically Signed Final Report   12/18/2022 05:26 pm ----------------------------------------------------------------------  [place] MFM OB Limited  Result Date: 12/18/2022 ----------------------------------------------------------------------  OBSTETRICS REPORT                       (Signed Final 12/18/2022 03:46 pm) ---------------------------------------------------------------------- Patient Info  ID #:       [id number]                          D.O.B.:  [birth year] (41 yrs)  Name:       Rebecca Webster                Visit Date: 12/18/2022 12:00 am ---------------------------------------------------------------------- Performed By  Attending:        Ma Rings MD         Referred By:      [facility] MAU/Triage  Performed By:     Isac Sarna        [facility] ---------------------------------------------------------------------- Orders  #  Description                           Code        Ordered By  1  [place] MFM OB LIMITED                     40981.19    Clayton Bibles ----------------------------------------------------------------------  #  Order #                     Accession #                Episode #  1  [id number]                   [id number]                 [id number] ----------------------------------------------------------------------  Indications  Premature rupture of membranes - leaking       O42.90  fluid  Maternal indication (passed mucus plug         O35.8XX0  12/17/22)  Advanced maternal age multigravida 66+,        O67.522  second trimester (41 yrs)  Cervical cerclage suture present, second       O34.32  trimester (09/22/22)  Poor obstetric history: Previous preterm       O09.219  delivery, antepartum (33 wks)  Cervical incompetence, second trimester        O34.32  (Procardia)  Poor obstetric history: Previous               O09.299   preeclampsia / eclampsia/gestational HTN  Low Risk NIPS  [redacted] weeks gestation of pregnancy                Z3A.25  History of cervical cerclage, currently        O09.299  pregnant ---------------------------------------------------------------------- Fetal Evaluation  Num Of Fetuses:         1  Fetal Heart Rate(bpm):  150  Cardiac Activity:       Observed  Presentation:           Cephalic  Placenta:               Anterior  P. Cord Insertion:      Previously visualized  Amniotic Fluid  AFI FV:      Within normal limits                              Largest Pocket(cm)                              6.1  Comment:    No placental abruption or previa identified. ---------------------------------------------------------------------- OB History  Gravidity:    10        Term:   3        Prem:   2        SAB:   3  Ectopic:      1        Living:  6 ---------------------------------------------------------------------- Gestational Age  LMP:           25w 5d        Date:  06/21/22                 EDD:   03/28/23  Best:          25w 5d     Det. By:  LMP  (06/21/22)          EDD:   03/28/23 ---------------------------------------------------------------------- Anatomy  Cranium:               Appears normal         LVOT:                   Previously seen  Cavum:                 Previously seen        Aortic Arch:            Previously seen  Ventricles:            Previously seen        Ductal Arch:            Previously seen  Choroid Plexus:        Previously seen        Diaphragm:              Appears normal  Cerebellum:            Previously seen        Stomach:                Appears normal, left                                                                        sided  Posterior Fossa:       Previously seen        Abdomen:                Previously seen  Nuchal Fold:           Previously seen        Abdominal Wall:         Previously seen  Face:                  Orbits and profile     Cord Vessels:           Previously seen                          previously seen  Lips:                  Previously seen        Kidneys:                Appear normal  Palate:                Previously             Bladder:                Appears normal                         visualized  Thoracic:              Appears normal         Spine:                  Previously seen  Heart:                 Previously seen        Upper Extremities:      Previously seen  RVOT:                  Previously seen        Lower Extremities:      Previously seen  Other:  SVC/IVC, 3VV, 3VTV not well visualized. Heels previously seen.          Hands and feet previously visualized.  Nasal bone, lenses, maxilla,          mandible and falx previously visualized. Female gender prev seen. ---------------------------------------------------------------------- Comments  This patient has been admitted due to PPROM.  A limited ultrasound performed today shows that the fetus is  in the vertex presentation.  There was  normal amniotic fluid noted with a maximal vertical  pocket of 6.1 cm. ----------------------------------------------------------------------                   Ma Rings, MD Electronically Signed Final Report   12/18/2022 03:46 pm ----------------------------------------------------------------------    Medications:  Scheduled  amoxicillin  500 mg Oral TID   aspirin  162 mg Oral Daily   docusate sodium  100 mg Oral Daily   metroNIDAZOLE  500 mg Oral Q12H   prenatal multivitamin  1 tablet Oral Q1200   I have reviewed the patient's current medications.  ASSESSMENT: [id number] 109w1d Estimated Date of Delivery: 03/28/23  P/PROM-->normal AFI McDonald cerclage in place, elective/prophylactic placement Previous C section for TOLAC   PLAN: >continue latency antibiotics + flagyl for BV >recheck sonogram Day &(5/21) and if AFI normal with no fluid passed until then recheck amniosure >neonatology to see today  Lazaro Arms 12/21/2022,7:52 AM

## 2022-12-22 DIAGNOSIS — O3432 Maternal care for cervical incompetence, second trimester: Secondary | ICD-10-CM | POA: Diagnosis not present

## 2022-12-22 DIAGNOSIS — Z3A26 26 weeks gestation of pregnancy: Secondary | ICD-10-CM | POA: Diagnosis not present

## 2022-12-22 DIAGNOSIS — O0992 Supervision of high risk pregnancy, unspecified, second trimester: Secondary | ICD-10-CM | POA: Diagnosis not present

## 2022-12-22 DIAGNOSIS — O42919 Preterm premature rupture of membranes, unspecified as to length of time between rupture and onset of labor, unspecified trimester: Secondary | ICD-10-CM | POA: Diagnosis not present

## 2022-12-22 NOTE — Progress Notes (Signed)
Patient ID: Rebecca Webster, female   DOB: 10-16-1980, 42 y.o.   MRN: [id number] ACULTY PRACTICE ANTEPARTUM COMPREHENSIVE PROGRESS NOTE  Rebecca Webster is a 42 y.o. [id number] at [redacted]w[redacted]d  who is admitted for PROM.   Fetal presentation is cephalic. Length of Stay:  4  Days  Subjective: Pt has no complaints this morning Patient reports good fetal movement.  She reports no uterine contractions, no bleeding and no loss of fluid per vagina.  Vitals:  Blood pressure 126/74, pulse 88, temperature 98 F (36.7 C), temperature source Oral, resp. rate 19, height 5\' 10"  (1.778 m), weight 110.7 kg, last menstrual period 06/21/2022, SpO2 100 %, unknown if currently breastfeeding.  Physical Examination: Lungs clear Heart RRR Abd soft + BS gravid non tender Ext non tender  Fetal Monitoring:  Baseline: 150 bpm, Variability: Fair (1-6 bpm), Accelerations: Non-reactive but appropriate for gestational age, and Decelerations: Absent  Labs:  No results found for this or any previous visit (from the past 24 hour(s)).  Imaging Studies:    NA   Medications:  Scheduled  amoxicillin  500 mg Oral TID   aspirin  162 mg Oral Daily   docusate sodium  100 mg Oral Daily   ferrous sulfate  325 mg Oral QODAY   metroNIDAZOLE  500 mg Oral Q12H   prenatal multivitamin  1 tablet Oral Q1200   I have reviewed the patient's current medications.  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 S/P BMZ . Continue with latency antibiotics. No S/Sx of infection at present. Delivery at 34 weeks or for maternal/fetal indications. Repeat AFI and speculum exam tomorrow.  Continue routine antenatal care.   Hermina Staggers 12/22/2022,7:47 AM

## 2022-12-23 DIAGNOSIS — O42912 Preterm premature rupture of membranes, unspecified as to length of time between rupture and onset of labor, second trimester: Secondary | ICD-10-CM | POA: Diagnosis not present

## 2022-12-23 DIAGNOSIS — Z3A26 26 weeks gestation of pregnancy: Secondary | ICD-10-CM | POA: Diagnosis not present

## 2022-12-23 LAB — AMNISURE RUPTURE OF MEMBRANE (ROM) NOT AT ARMC: Amnisure ROM: POSITIVE

## 2022-12-23 MED ORDER — FAMOTIDINE 20 MG PO TABS
20.0000 mg | ORAL_TABLET | Freq: Two times a day (BID) | ORAL | Status: DC
  Filled 2022-12-23: qty 1

## 2022-12-23 MED ORDER — FAMOTIDINE 20 MG PO TABS
20.0000 mg | ORAL_TABLET | Freq: Two times a day (BID) | ORAL | Status: DC | PRN
  Administered 2023-01-10 – 2023-01-17 (×7): 20 mg via ORAL
  Filled 2022-12-23 (×7): qty 1

## 2022-12-23 NOTE — Progress Notes (Signed)
Daily Antepartum Note  Admission Date: 12/17/2022 Current Date: 12/23/2022 8:59 AM  Rebecca Webster is a 42 y.o. [id number] at [redacted]w[redacted]d, admitted for PPROM.  Pregnancy complicated by: Patient Active Problem List   Diagnosis Date Noted   Preterm premature rupture of membranes (PPROM) with unknown onset of labor 12/18/2022   Cervical cerclage suture present 12/18/2022   Cervical insufficiency during pregnancy in second trimester, antepartum 09/22/2022   AMA (advanced maternal age) multigravida 35+ 09/12/2022   Supervision of high-risk pregnancy 08/20/2022   History of vaginal delivery following previous cesarean delivery 08/20/2022   History of gestational hypertension 08/20/2022   History of preterm premature rupture of membranes 08/20/2022   History of cervical cerclage 09/29/2019    Overnight/24hr events:  none  Subjective:  No OB s/s. No vaginitis s/s   Objective:    Current Vital Signs 24h Vital Sign Ranges  T 98.2 F (36.8 C) Temp  Avg: 98.4 F (36.9 C)  Min: 98.2 F (36.8 C)  Max: 98.5 F (36.9 C)  BP 118/73 BP  Min: 118/73  Max: 135/70  HR 88 Pulse  Avg: 90  Min: 88  Max: 92  RR 17 Resp  Avg: 17.2  Min: 16  Max: 18  SaO2 98 % Room Air SpO2  Avg: 99.4 %  Min: 98 %  Max: 100 %       24 Hour I/O Current Shift I/O  Time Ins Outs No intake/output data recorded. No intake/output data recorded.   Fetal Heart Tones: 140 baseline, + accels or decel, min to mod variability Tocometry: toco quiet x 75m  Physical exam: General: Well nourished, well developed female in no acute distress. SSE: amnisure done for one minute and then SSE done. Scant white, thin d/c in vault (no obvious AF), negative cough test. Cervix appears closed/long and stitch wnl and does not appear to be under tension with knot at 12 o'clock Abdomen: gravid nttp Respiratory: No respiratory distress Extremities: no clubbing, cyanosis or edema Skin: Warm and dry.   Medications: Current  Facility-Administered Medications  Medication Dose Route Frequency Provider Last Rate Last Admin   acetaminophen (TYLENOL) tablet 1,000 mg  1,000 mg Oral Q6H PRN Harvie Bridge C, MD       amoxicillin (AMOXIL) capsule 500 mg  500 mg Oral TID Harvie Bridge C, MD   500 mg at 12/22/22 2131   aspirin chewable tablet 162 mg  162 mg Oral Daily Lennart Pall, MD   162 mg at 12/22/22 1005   calcium carbonate (TUMS - dosed in mg elemental calcium) chewable tablet 400 mg of elemental calcium  2 tablet Oral Q4H PRN Harvie Bridge C, MD   400 mg of elemental calcium at 12/18/22 0636   cyclobenzaprine (FLEXERIL) tablet 10 mg  10 mg Oral TID PRN Lennart Pall, MD       docusate sodium (COLACE) capsule 100 mg  100 mg Oral Daily Harvie Bridge C, MD   100 mg at 12/22/22 1006   ferrous sulfate tablet 325 mg  325 mg Oral Lottie Mussel, MD   325 mg at 12/22/22 1006   metroNIDAZOLE (FLAGYL) tablet 500 mg  500 mg Oral Q12H [place] Bing, MD   500 mg at 12/22/22 2131   polyethylene glycol (MIRALAX / GLYCOLAX) packet 17 g  17 g Oral Daily PRN Lennart Pall, MD   17 g at 12/22/22 1014   prenatal multivitamin tablet 1 tablet  1 tablet Oral Q1200 Lennart Pall, MD  1 tablet at 12/18/22 1034   senna (SENOKOT) tablet 8.6 mg  1 tablet Oral Daily PRN Lennart Pall, MD        Labs: no new labs  Radiology:  5/17: transverse, normal af level 5/14: cephalic, 50%, efw 874g, ac 60%, afi wnl  Assessment & Plan:  Patient stable  *Pregnancy: EFM appropriate for gestational age *?PPROM: if amnisure positive, continue inpatient care. If negative, repeat afi and if still wnl then okay for d/c to home.  -continue latency abx #D6/7. Continue flagyl D#4/7 for BV. S/p diflucan on 5/17  *Preterm: Mg PRN for fetal NP -s/p bmz on 5/14 and 5/15.  -s/p NICU consult *Cerclage: prophylactic mcdonald cerclage with ticron stitch still in place. Remove for any s/s of labor or need for  delivery.  H/o GHTN: continue low dose ASA *AMA: no issues *h/o VBAC: consent signed 5/16 *PPx: SCDs, OOB ad lib *Dispo: inpatient since likely PPROM.   Cornelia Copa MD Attending Center for [facility] (Faculty Practice) GYN Consult Phone: [phone] (M-F, [phone]) & [phone]  (Off hours, weekends, holidays)

## 2022-12-24 DIAGNOSIS — Z3A26 26 weeks gestation of pregnancy: Secondary | ICD-10-CM | POA: Diagnosis not present

## 2022-12-24 DIAGNOSIS — O42919 Preterm premature rupture of membranes, unspecified as to length of time between rupture and onset of labor, unspecified trimester: Secondary | ICD-10-CM | POA: Diagnosis not present

## 2022-12-24 DIAGNOSIS — O0992 Supervision of high risk pregnancy, unspecified, second trimester: Secondary | ICD-10-CM | POA: Diagnosis not present

## 2022-12-24 DIAGNOSIS — O3432 Maternal care for cervical incompetence, second trimester: Secondary | ICD-10-CM | POA: Diagnosis not present

## 2022-12-24 MED ORDER — SODIUM CHLORIDE 0.9% FLUSH
3.0000 mL | Freq: Two times a day (BID) | INTRAVENOUS | Status: DC
  Administered 2022-12-24 – 2023-01-23 (×60): 3 mL via INTRAVENOUS

## 2022-12-24 NOTE — Progress Notes (Signed)
FACULTY PRACTICE ANTEPARTUM PROGRESS NOTE  Rebecca Webster is a 42 y.o. [id number] at [redacted]w[redacted]d who is admitted for PROM.  Estimated Date of Delivery: 03/28/23 Fetal presentation is  transverse head to maternal left .  Length of Stay:  6 Days. Admitted 12/17/2022  Subjective: Pt seen doing well.  No acute complaints.  Pt denies fever/chills.  No real documented continued leakage per pt. Patient reports normal fetal movement.  She denies uterine contractions, denies bleeding and obvious leaking of fluid per vagina.  Vitals:  Blood pressure 138/72, pulse 91, temperature 98.2 F (36.8 C), temperature source Oral, resp. rate 18, height 5\' 10"  (1.778 m), weight 110.7 kg, last menstrual period 06/21/2022, SpO2 99 %, unknown if currently breastfeeding. Physical Examination: CONSTITUTIONAL: Well-developed, well-nourished female in no acute distress.  HENT:  Normocephalic, atraumatic, External right and left ear normal. Oropharynx is clear and moist EYES: Conjunctivae and EOM are normal. NECK: Normal range of motion, supple, no masses. SKIN: Skin is warm and dry. No rash noted. Not diaphoretic. No erythema. No pallor. NEUROLGIC: Alert and oriented to person, place, and time. Normal reflexes, muscle tone coordination. No cranial nerve deficit noted. PSYCHIATRIC: Normal mood and affect. Normal behavior. Normal judgment and thought content. CARDIOVASCULAR: Normal heart rate noted, regular rhythm RESPIRATORY: Effort and breath sounds normal, no problems with respiration noted MUSCULOSKELETAL: Normal range of motion. No edema and no tenderness. ABDOMEN: Soft, nontender, nondistended, gravid. CERVIX: deferred  Fetal monitoring: FHR: 150s bpm, Variability: moderate, Accelerations: Present, Decelerations: Absent  Uterine activity: none   Results for orders placed or performed during the hospital encounter of 12/17/22 (from the past 48 hour(s))  Amnisure rupture of membrane (rom)not at [facility]     Status: None    Collection Time: 12/23/22  8:54 AM  Result Value Ref Range   Amnisure ROM POSITIVE     Comment: Performed at [facility], [address] [place], [place] [address]    I have reviewed the patient's current medications.  ASSESSMENT: Principal Problem:   Preterm premature rupture of membranes (PPROM) with unknown onset of labor Active Problems:   Supervision of high-risk pregnancy   History of vaginal delivery following previous cesarean delivery   Cervical insufficiency during pregnancy in second trimester, antepartum   Cervical cerclage suture present   PLAN: Amnisure recently positive.  If no significant leakage in a few days consider sac may have resealed.May need to reconsult with MFM regarding continued inpatient stay. Latency abx conclude today. Continue flagyl for a total of 7 days. Magnesium sulfate prn for neuroprotection if needed. Pt is s/p BMZ x 2 Cerclage still in place.  Removal if pt begins to show signs of infection or obvious labor. Continue baby ASA Pt desires VBAC, consent signed 5/16. Remain inpatient until 34 weeks and then proceed with IOL. delivery if there are obvious signs of maternal/fetal compromise or worsening status   Continue routine antenatal care.   Mariel Aloe, MD [facility] Faculty Attending, Center for [facility] 12/24/2022 12:15 PM

## 2022-12-24 NOTE — Consult Note (Addendum)
MFM Note  The patient continues to deny any leakage of fluid.  She is currently at 26 weeks and 4 days.    Her repeat AmniSure test performed yesterday was positive, confirming rupture membranes.    She reports feeling fetal movements throughout the day.  The patient was advised that since rupture of membranes has been confirmed, she will require inpatient management until delivery.  She should complete her course of latency antibiotics.  The goal for her delivery would be 34 weeks.    Delivery prior to 34 weeks would be indicated should she show any signs of an intrauterine infection, should she go into spontaneous labor, or for nonreassuring fetal status.  Her cerclage stitch should be removed if she is in labor or should she require delivery.    She should continue to have weekly ultrasounds performed for amniotic fluid assessment.    The patient is comfortable with continued inpatient management.    She stated that all of her questions were answered.

## 2022-12-25 ENCOUNTER — Inpatient Hospital Stay (HOSPITAL_BASED_OUTPATIENT_CLINIC_OR_DEPARTMENT_OTHER): Payer: Medicaid Other

## 2022-12-25 DIAGNOSIS — O09522 Supervision of elderly multigravida, second trimester: Secondary | ICD-10-CM

## 2022-12-25 DIAGNOSIS — O3432 Maternal care for cervical incompetence, second trimester: Secondary | ICD-10-CM

## 2022-12-25 DIAGNOSIS — Z3A26 26 weeks gestation of pregnancy: Secondary | ICD-10-CM

## 2022-12-25 DIAGNOSIS — O42919 Preterm premature rupture of membranes, unspecified as to length of time between rupture and onset of labor, unspecified trimester: Secondary | ICD-10-CM | POA: Diagnosis not present

## 2022-12-25 DIAGNOSIS — O0992 Supervision of high risk pregnancy, unspecified, second trimester: Secondary | ICD-10-CM | POA: Diagnosis not present

## 2022-12-25 DIAGNOSIS — O42912 Preterm premature rupture of membranes, unspecified as to length of time between rupture and onset of labor, second trimester: Secondary | ICD-10-CM

## 2022-12-25 LAB — TYPE AND SCREEN
ABO/RH(D): O POS
Antibody Screen: NEGATIVE

## 2022-12-25 LAB — CBC
HCT: 31.4 % — ABNORMAL LOW (ref 36.0–46.0)
Hemoglobin: 10 g/dL — ABNORMAL LOW (ref 12.0–15.0)
MCH: 22.8 pg — ABNORMAL LOW (ref 26.0–34.0)
MCHC: 31.8 g/dL (ref 30.0–36.0)
MCV: 71.7 fL — ABNORMAL LOW (ref 80.0–100.0)
Platelets: 238 10*3/uL (ref 150–400)
RBC: 4.38 MIL/uL (ref 3.87–5.11)
RDW: 14.7 % (ref 11.5–15.5)
WBC: 10.7 10*3/uL — ABNORMAL HIGH (ref 4.0–10.5)
nRBC: 0.2 % (ref 0.0–0.2)

## 2022-12-25 MED ORDER — SIMETHICONE 80 MG PO CHEW
80.0000 mg | CHEWABLE_TABLET | Freq: Four times a day (QID) | ORAL | Status: DC | PRN
  Administered 2022-12-25 – 2022-12-27 (×2): 80 mg via ORAL
  Filled 2022-12-25 (×2): qty 1

## 2022-12-25 NOTE — Progress Notes (Signed)
FACULTY PRACTICE ANTEPARTUM PROGRESS NOTE  Rebecca Webster is a 42 y.o. [id number] at [redacted]w[redacted]d who is admitted for PROM.  Estimated Date of Delivery: 03/28/23 Fetal presentation is  transverse .  Length of Stay:  7 Days. Admitted 12/17/2022  Subjective: Pt awake and alert.  She denies fever/chills. Patient reports normal fetal movement.  She denies uterine contractions, denies bleeding and leaking of fluid per vagina.  Pt denies any new discharge or leakage of fluid.  Vitals:  Blood pressure 124/74, pulse 80, temperature 98.4 F (36.9 C), temperature source Oral, resp. rate 18, height 5\' 10"  (1.778 m), weight 110.7 kg, last menstrual period 06/21/2022, SpO2 100 %, unknown if currently breastfeeding. Physical Examination: CONSTITUTIONAL: Well-developed, well-nourished female in no acute distress.  HENT:  Normocephalic, atraumatic, External right and left ear normal. Oropharynx is clear and moist EYES: Conjunctivae and EOM are normal.  NECK: Normal range of motion, supple, no masses. SKIN: Skin is warm and dry. No rash noted. Not diaphoretic. No erythema. No pallor. NEUROLGIC: Alert and oriented to person, place, and time. Normal reflexes, muscle tone coordination. No cranial nerve deficit noted. PSYCHIATRIC: Normal mood and affect. Normal behavior. Normal judgment and thought content. CARDIOVASCULAR: Normal heart rate noted, regular rhythm RESPIRATORY: Effort and breath sounds normal, no problems with respiration noted MUSCULOSKELETAL: Normal range of motion. No edema and no tenderness. ABDOMEN: Soft, nontender, nondistended, gravid. CERVIX: deferred  Fetal monitoring: FHR: 140-150s bpm, Variability: moderate, Accelerations: Present, Decelerations: Absent  Uterine activity: none   Results for orders placed or performed during the hospital encounter of 12/17/22 (from the past 48 hour(s))  Type and screen [facility]     Status: None   Collection Time: 12/25/22  5:20 AM   Result Value Ref Range   ABO/RH(D) O POS    Antibody Screen NEG    Sample Expiration      12/28/2022,2359 Performed at [facility], [address], [place], [place] [address]   CBC     Status: Abnormal   Collection Time: 12/25/22  5:23 AM  Result Value Ref Range   WBC 10.7 (H) 4.0 - 10.5 K/uL   RBC 4.38 3.87 - 5.11 MIL/uL   Hemoglobin 10.0 (L) 12.0 - 15.0 g/dL   HCT 62.1 (L) 30.8 - 65.7 %   MCV 71.7 (L) 80.0 - 100.0 fL   MCH 22.8 (L) 26.0 - 34.0 pg   MCHC 31.8 30.0 - 36.0 g/dL   RDW 84.6 96.2 - 95.2 %   Platelets 238 150 - 400 K/uL   nRBC 0.2 0.0 - 0.2 %    Comment: Performed at [facility], [address], [place], [place] [address]    I have reviewed the patient's current medications.  ASSESSMENT: Principal Problem:   Preterm premature rupture of membranes (PPROM) with unknown onset of labor Active Problems:   Supervision of high-risk pregnancy   History of vaginal delivery following previous cesarean delivery   Cervical insufficiency during pregnancy in second trimester, antepartum   Cervical cerclage suture present   PLAN: No s/sx of chorioamnionitis. WBC stable to improved. Pt has completed latency abx. S/P BMZ course. Remain inpatient until 34 weeks. Consider recheck amniosure next week if no further leaking, will discuss with MFM. Appreciate MFM assistance. Daily NST Delivery if signs of maternal/fetal compromise,  CBC q 72 hours.   Continue routine antenatal care.   Mariel Aloe, MD [facility] Faculty Attending, Center for [facility] 12/25/2022 11:36 AM

## 2022-12-25 NOTE — Progress Notes (Signed)
Initial Nutrition Assessment  DOCUMENTATION CODES:   Obesity unspecified  INTERVENTION:  Regular Diet Pt may order double protein portions and snacks TID if she makes request when ordering meals   NUTRITION DIAGNOSIS:   Increased nutrient needs related to  (pregnancy and fetal growth requirements) as evidenced by  (26 weeks IUP).  GOAL:   Patient will meet greater than or equal to 90% of their needs  MONITOR:   Weight trends  REASON FOR ASSESSMENT:   Antenatal, LOS    ASSESSMENT:   Now 26 5/7 weeks IUP, adm due to PROM and cerclage. Weight at initial prenatal visit/ 11 weeks 98.3 kg, BMI 31. Weight gain of 12.4 kg. On prenatal Vits and FeSO4 QOD. 5/21 Hct 31.4% IOL planned at [redacted] weeks gestation   Diet Order:   Diet Order             Diet regular Room service appropriate? Yes; Fluid consistency: Thin  Diet effective now                   EDUCATION NEEDS:   No education needs have been identified at this time  Skin:  Skin Assessment: Reviewed RN Assessment   Height:   Ht Readings from Last 1 Encounters:  12/17/22 5\' 10"  (1.778 m)    Weight:   Wt Readings from Last 1 Encounters:  12/17/22 110.7 kg    Ideal Body Weight:   150 lbs  BMI:  Body mass index is 35.02 kg/m.  Estimated Nutritional Needs:   Kcal:  2400-2600  Protein:  105-115 g  Fluid:  > 2.6 L

## 2022-12-26 DIAGNOSIS — O42919 Preterm premature rupture of membranes, unspecified as to length of time between rupture and onset of labor, unspecified trimester: Secondary | ICD-10-CM | POA: Diagnosis not present

## 2022-12-26 DIAGNOSIS — O0992 Supervision of high risk pregnancy, unspecified, second trimester: Secondary | ICD-10-CM | POA: Diagnosis not present

## 2022-12-26 DIAGNOSIS — O3432 Maternal care for cervical incompetence, second trimester: Secondary | ICD-10-CM | POA: Diagnosis not present

## 2022-12-26 DIAGNOSIS — Z3A26 26 weeks gestation of pregnancy: Secondary | ICD-10-CM | POA: Diagnosis not present

## 2022-12-26 NOTE — Progress Notes (Signed)
FACULTY PRACTICE ANTEPARTUM PROGRESS NOTE  Rebecca Webster is a 42 y.o. [id number] at [redacted]w[redacted]d who is admitted for PROM.  Estimated Date of Delivery: 03/28/23 Fetal presentation is breech.  Length of Stay:  8 Days. Admitted 12/17/2022  Subjective: Pt seen, doing well.  No acute complaints.  Pt denies fever, chills or abdominal tenderness. Patient reports normal fetal movement.  She denies uterine contractions, denies bleeding and leaking of fluid per vagina.  Vitals:  Blood pressure 123/73, pulse 88, temperature 98.5 F (36.9 C), temperature source Oral, resp. rate 16, height 5\' 10"  (1.778 m), weight 110.7 kg, last menstrual period 06/21/2022, SpO2 100 %, unknown if currently breastfeeding. Physical Examination: CONSTITUTIONAL: Well-developed, well-nourished female in no acute distress.  HENT:  Normocephalic, atraumatic, External right and left ear normal. Oropharynx is clear and moist EYES: Conjunctivae and EOM are normal.  NECK: Normal range of motion, supple, no masses. SKIN: Skin is warm and dry. No rash noted. Not diaphoretic. No erythema. No pallor. NEUROLGIC: Alert and oriented to person, place, and time. Normal reflexes, muscle tone coordination. No cranial nerve deficit noted. PSYCHIATRIC: Normal mood and affect. Normal behavior. Normal judgment and thought content. CARDIOVASCULAR: Normal heart rate noted, regular rhythm RESPIRATORY: Effort and breath sounds normal, no problems with respiration noted MUSCULOSKELETAL: Normal range of motion. No edema and no tenderness. ABDOMEN: Soft, nontender, nondistended, gravid. CERVIX: deferred  Fetal monitoring: FHR: 150s bpm, Variability: moderate, Accelerations: Present, Decelerations: Absent  Uterine activity: none  Results for orders placed or performed during the hospital encounter of 12/17/22 (from the past 48 hour(s))  Type and screen [facility]     Status: None   Collection Time: 12/25/22  5:20 AM  Result Value Ref  Range   ABO/RH(D) O POS    Antibody Screen NEG    Sample Expiration      12/28/2022,2359 Performed at [facility], [address] [place], [place] [address]   CBC     Status: Abnormal   Collection Time: 12/25/22  5:23 AM  Result Value Ref Range   WBC 10.7 (H) 4.0 - 10.5 K/uL   RBC 4.38 3.87 - 5.11 MIL/uL   Hemoglobin 10.0 (L) 12.0 - 15.0 g/dL   HCT 78.4 (L) 69.6 - 29.5 %   MCV 71.7 (L) 80.0 - 100.0 fL   MCH 22.8 (L) 26.0 - 34.0 pg   MCHC 31.8 30.0 - 36.0 g/dL   RDW 28.4 13.2 - 44.0 %   Platelets 238 150 - 400 K/uL   nRBC 0.2 0.0 - 0.2 %    Comment: Performed at [facility], [address], [place], [place] [address]    I have reviewed the patient's current medications.  ASSESSMENT: Principal Problem:   Preterm premature rupture of membranes (PPROM) with unknown onset of labor Active Problems:   Supervision of high-risk pregnancy   History of vaginal delivery following previous cesarean delivery   Cervical insufficiency during pregnancy in second trimester, antepartum   Cervical cerclage suture present   PLAN: No s/sx of chorioamnionitis Pt is s/p BMZ and latency abx. 2 hour GTT tomorrow. Will recheck growth /AFI in 1 week.  Per MFM they will review and discuss after that exam; otherwise, remain inpatient until 34 weeks and delivery.   Continue routine antenatal care.   Mariel Aloe, MD [facility] Faculty Attending, Center for [facility] 12/26/2022 11:32 AM

## 2022-12-27 ENCOUNTER — Ambulatory Visit: Payer: Medicaid Other

## 2022-12-27 DIAGNOSIS — O0992 Supervision of high risk pregnancy, unspecified, second trimester: Secondary | ICD-10-CM | POA: Diagnosis not present

## 2022-12-27 DIAGNOSIS — Z3A26 26 weeks gestation of pregnancy: Secondary | ICD-10-CM | POA: Diagnosis not present

## 2022-12-27 DIAGNOSIS — O42919 Preterm premature rupture of membranes, unspecified as to length of time between rupture and onset of labor, unspecified trimester: Secondary | ICD-10-CM | POA: Diagnosis not present

## 2022-12-27 DIAGNOSIS — O3432 Maternal care for cervical incompetence, second trimester: Secondary | ICD-10-CM | POA: Diagnosis not present

## 2022-12-27 LAB — GLUCOSE, 2 HOUR GESTATIONAL: Glucose Tolerance, 2 hour: 133 mg/dL

## 2022-12-27 LAB — GLUCOSE, FASTING GESTATIONAL: Glucose Tolerance, Fasting: 92 mg/dL

## 2022-12-27 NOTE — Progress Notes (Addendum)
FACULTY PRACTICE ANTEPARTUM PROGRESS NOTE  Rebecca Webster is a 42 y.o. [id number] at [redacted]w[redacted]d who is admitted for PROM.  Estimated Date of Delivery: 03/28/23 Fetal presentation is breech.  Length of Stay:  9 Days. Admitted 12/17/2022  Subjective: Doing well without complaints this morning. Denies contractions, bleeding, LOF, fevers/chills/malaise. Reports good fetal movement.   Vitals:  Blood pressure 131/76, pulse 90, temperature 98.3 F (36.8 C), temperature source Oral, resp. rate 16, height 5\' 10"  (1.778 m), weight 110.7 kg, last menstrual period 06/21/2022, SpO2 99 %, unknown if currently breastfeeding. Physical Examination: CONSTITUTIONAL: Well-developed, well-nourished female in no acute distress.  NEUROLGIC: Alert and oriented to person, place, and time.  CARDIOVASCULAR: Normal heart rate noted RESPIRATORY: Effort normal, no problems with respiration noted ABDOMEN: Soft, nontender, nondistended, gravid. No fundal tenderness CERVIX: deferred. Cerclage in place.   Fetal monitoring: Baseline 150bpm, moderate variability, 10x10 accels, 1 variable decel - appropriate for gestational age Uterine activity:flat Results for orders placed or performed during the hospital encounter of 12/17/22 (from the past 48 hour(s))  Glucose, fasting gestational     Status: None   Collection Time: 12/27/22  5:19 AM  Result Value Ref Range   Glucose Tolerance, Fasting 92 mg/dL    Comment: Performed at [facility], [address] [place], [place] [address]  Glucose, 2 hour gestational     Status: None   Collection Time: 12/27/22  8:24 AM  Result Value Ref Range   Glucose Tolerance, 2 hour 133 mg/dL    Comment: Performed at [facility], [address] [place], [place] [address]   I have reviewed the patient's current medications.  ASSESSMENT: Principal Problem:   Preterm premature rupture of membranes (PPROM) with unknown onset of labor Active Problems:   Supervision of high-risk  pregnancy   History of vaginal delivery following previous cesarean delivery   Cervical insufficiency during pregnancy in second trimester, antepartum   Cervical cerclage suture present   PLAN: PPROM, cerclage in place - s/p latency abx - no e/o PTL, IAI or abruption at this time - Will recheck growth & AFI in 1 week.  Per MFM they will review and discuss after that exam; otherwise, remain inpatient until 34 weeks and delivery.  2. FWB - dNSTs - S/p BMZ 5/14-15 - Mag eligible - Growth 5/14 @ 25/5 874g (50%) - S/p NICU consult   3. Routine - 2h GTT today - will discuss tdap  Continue routine antenatal care.  Harvie Bridge, MD Obstetrician & Gynecologist, [facility] for [facility], [facility] 12/27/2022 10:00 AM

## 2022-12-28 ENCOUNTER — Encounter: Payer: Medicaid Other | Admitting: Obstetrics & Gynecology

## 2022-12-28 ENCOUNTER — Other Ambulatory Visit: Payer: Medicaid Other

## 2022-12-28 DIAGNOSIS — O42919 Preterm premature rupture of membranes, unspecified as to length of time between rupture and onset of labor, unspecified trimester: Secondary | ICD-10-CM | POA: Diagnosis not present

## 2022-12-28 DIAGNOSIS — O3432 Maternal care for cervical incompetence, second trimester: Secondary | ICD-10-CM | POA: Diagnosis not present

## 2022-12-28 DIAGNOSIS — O0992 Supervision of high risk pregnancy, unspecified, second trimester: Secondary | ICD-10-CM | POA: Diagnosis not present

## 2022-12-28 DIAGNOSIS — Z3A26 26 weeks gestation of pregnancy: Secondary | ICD-10-CM | POA: Diagnosis not present

## 2022-12-28 LAB — CBC
HCT: 32.3 % — ABNORMAL LOW (ref 36.0–46.0)
Hemoglobin: 10.1 g/dL — ABNORMAL LOW (ref 12.0–15.0)
MCH: 23 pg — ABNORMAL LOW (ref 26.0–34.0)
MCHC: 31.3 g/dL (ref 30.0–36.0)
MCV: 73.4 fL — ABNORMAL LOW (ref 80.0–100.0)
Platelets: 248 10*3/uL (ref 150–400)
RBC: 4.4 MIL/uL (ref 3.87–5.11)
RDW: 15.3 % (ref 11.5–15.5)
WBC: 11.2 10*3/uL — ABNORMAL HIGH (ref 4.0–10.5)
nRBC: 0.2 % (ref 0.0–0.2)

## 2022-12-28 LAB — TYPE AND SCREEN
ABO/RH(D): O POS
Antibody Screen: NEGATIVE

## 2022-12-28 MED ORDER — FERROUS SULFATE 325 (65 FE) MG PO TABS
325.0000 mg | ORAL_TABLET | ORAL | Status: DC
  Administered 2022-12-31 – 2023-01-09 (×4): 325 mg via ORAL
  Filled 2022-12-28 (×4): qty 1

## 2022-12-28 NOTE — Progress Notes (Signed)
FACULTY PRACTICE ANTEPARTUM(COMPREHENSIVE) NOTE  Rebecca Webster is a 42 y.o. [id number] with Estimated Date of Delivery: 03/28/23   By  LMP [redacted]w[redacted]d  who is admitted for PPROM.    Fetal presentation is breech. Length of Stay:  10  Days  Date of admission:12/17/2022  Subjective: Pt resting comfortably, reports no acute complaints or concerns overnight. Patient reports the fetal movement as active. Patient reports uterine contraction  activity as none. Patient reports  vaginal bleeding as none. Patient describes fluid per vagina as None.  Vitals:  Blood pressure 131/74, pulse 90, temperature 98.1 F (36.7 C), temperature source Oral, resp. rate 16, height 5\' 10"  (1.778 m), weight 110.7 kg, last menstrual period 06/21/2022, SpO2 98 %, unknown if currently breastfeeding. Vitals:   12/27/22 1433 12/27/22 1655 12/27/22 2013 12/28/22 0835  BP: 122/71 129/71 135/75 131/74  Pulse: 89 96 89 90  Resp: 16 16 17 16   Temp: 98.4 F (36.9 C) 97.6 F (36.4 C) 97.6 F (36.4 C) 98.1 F (36.7 C)  TempSrc: Oral Oral Oral Oral  SpO2: 99% 100% 100% 98%  Weight:      Height:       Physical Examination:  General appearance - alert, well appearing, and in no distress Mental status - normal mood, behavior, speech, dress, motor activity, and thought processes Chest - clear to auscultation, no wheezes, rales or rhonchi, symmetric air entry Heart - normal rate and regular rhythm Abdomen - gravid, soft and non-tender Extremities - no edema, no calf tenderness bilaterally Skin -warm and dry   Fetal Monitoring:  Baseline: 150 bpm, Variability: Moderate, Accelerations: +10x10 accels, and Decelerations: Absent    Appropriate for current gestational age  Labs:  Results for orders placed or performed during the hospital encounter of 12/17/22 (from the past 24 hour(s))  CBC   Collection Time: 12/28/22  5:02 AM  Result Value Ref Range   WBC 11.2 (H) 4.0 - 10.5 K/uL   RBC 4.40 3.87 - 5.11 MIL/uL   Hemoglobin  10.1 (L) 12.0 - 15.0 g/dL   HCT 54.0 (L) 98.1 - 19.1 %   MCV 73.4 (L) 80.0 - 100.0 fL   MCH 23.0 (L) 26.0 - 34.0 pg   MCHC 31.3 30.0 - 36.0 g/dL   RDW 47.8 29.5 - 62.1 %   Platelets 248 150 - 400 K/uL   nRBC 0.2 0.0 - 0.2 %  Type and screen [facility]   Collection Time: 12/28/22  5:02 AM  Result Value Ref Range   ABO/RH(D) O POS    Antibody Screen NEG    Sample Expiration      12/31/2022,2359 Performed at [facility], [address] [place], [place] [address]     Imaging Studies:    [place] MFM OB LIMITED  Result Date: 12/25/2022 ----------------------------------------------------------------------  OBSTETRICS REPORT                        (Signed Final 12/25/2022 11:11 am) ---------------------------------------------------------------------- Patient Info  ID #:       [id number]                          D.O.B.:  1981/06/15 (41 yrs)  Name:       Rebecca Webster [facility]                Visit Date: 12/25/2022 08:56 am ---------------------------------------------------------------------- Performed By  Attending:  Noralee Space MD        Referred By:       [facility]                                                              Care  Performed By:     Marcellina Millin          [facility] ---------------------------------------------------------------------- Orders  #  Description                           Code        Ordered By  1  [place] MFM OB LIMITED                     16109.60    Duane Lope ----------------------------------------------------------------------  #  Order #                     Accession #                Episode #  1  [id number]                   [id number]                 [id number] ---------------------------------------------------------------------- Indications  Premature rupture of membranes - leaking        O42.90  fluid  [redacted] weeks gestation of pregnancy                  Z3A.26  Advanced maternal age multigravida 71+,         O17.522  second trimester (41 yrs)  Cervical cerclage suture present, second        O34.32  trimester (09/22/22)  Cervical incompetence, second trimester         O34.32  (Procardia) ---------------------------------------------------------------------- Fetal Evaluation  Num Of Fetuses:          1  Fetal Heart Rate(bpm):   150  Cardiac Activity:        Observed  Presentation:            Breech  Placenta:                Anterior  Amniotic Fluid  AFI FV:      Within normal limits                              Largest Pocket(cm)                              4.4 ---------------------------------------------------------------------- OB History  Gravidity:    10        Term:   3        Prem:  2        SAB:   3  Ectopic:      1        Living:  6 ---------------------------------------------------------------------- Gestational Age  LMP:           26w 5d        Date:  06/21/22                 EDD:   03/28/23  Best:          Rebecca Webster 5d     Det. By:  LMP  (06/21/22)          EDD:   03/28/23 ---------------------------------------------------------------------- Anatomy  Cranium:               Previously seen        Stomach:                Appears normal, left                                                                        sided  Ventricles:            Appears normal         Kidneys:                Appear normal  Diaphragm:             Appears normal         Bladder:                Appears normal ---------------------------------------------------------------------- Cervix Uterus Adnexa  Cervix  Not visualized (advanced GA >24wks)  Uterus  No abnormality visualized.  Right Ovary  Within normal limits.  Left Ovary  Within normal limits.  Adnexa  No abnormality visualized ---------------------------------------------------------------------- Impression  Patient is admitted with diagnosis of PPROM.  A limited ultrasound study was performed. Deepest vertical  pocket  measures 4 cm, which is normal but amniotic fluid is  subjectively decreased. Cephalic presentation. ---------------------------------------------------------------------- Recommendations  -Repeat ultrasound in a week. ----------------------------------------------------------------------                 Noralee Space, MD Electronically Signed Final Report   12/25/2022 11:11 am ----------------------------------------------------------------------    Medications:  Scheduled  aspirin  162 mg Oral Daily   docusate sodium  100 mg Oral Daily   ferrous sulfate  325 mg Oral QODAY   prenatal multivitamin  1 tablet Oral Q1200   sodium chloride flush  3 mL Intravenous Q12H   I have reviewed the patient's current medications.  ASSESSMENT: [id number] [redacted]w[redacted]d Estimated Date of Delivery: 03/28/23  Patient Active Problem List   Diagnosis Date Noted   Preterm premature rupture of membranes (PPROM) with unknown onset of labor 12/18/2022   Cervical cerclage suture present 12/18/2022   Cervical insufficiency during pregnancy in second trimester, antepartum 09/22/2022   AMA (advanced maternal age) multigravida 35+ 09/12/2022   Supervision of high-risk pregnancy 08/20/2022   History of vaginal delivery following previous cesarean delivery 08/20/2022   History of gestational hypertension 08/20/2022   History of preterm premature rupture of membranes 08/20/2022   History of cervical cerclage 09/29/2019    PLAN: 1) PPROM-cerclage in place -S/p latency antibiotics -No  evidence of infection or preterm labor -s/p BMZ -Repeat growth and AFI in 1 week, last checked 5/21  2) FWB -Continue NSTs daily, appropriate for current gestational age -Last growth on 5/14-874 g (50%) -seen by NICU  3) OB care -Normal Glucola -Continue routine antenatal care  DISPO: Continue inpatient management with plan for delivery at 34 weeks or sooner pending maternal-fetal status  Myna Hidalgo, DO Attending Obstetrician &  Gynecologist, Faculty Practice Center for [employer], [facility]

## 2022-12-29 DIAGNOSIS — O3432 Maternal care for cervical incompetence, second trimester: Secondary | ICD-10-CM | POA: Diagnosis not present

## 2022-12-29 DIAGNOSIS — Z3A27 27 weeks gestation of pregnancy: Secondary | ICD-10-CM

## 2022-12-29 DIAGNOSIS — O0992 Supervision of high risk pregnancy, unspecified, second trimester: Secondary | ICD-10-CM | POA: Diagnosis not present

## 2022-12-29 DIAGNOSIS — O42919 Preterm premature rupture of membranes, unspecified as to length of time between rupture and onset of labor, unspecified trimester: Secondary | ICD-10-CM | POA: Diagnosis not present

## 2022-12-29 NOTE — Progress Notes (Signed)
FACULTY PRACTICE ANTEPARTUM(COMPREHENSIVE) NOTE  LATIVIA WENDORF is a 42 y.o. [id number] with Estimated Date of Delivery: 03/28/23   By  LMP [redacted]w[redacted]d  who is admitted for PPROM.    Fetal presentation is breech. Length of Stay:  11  Days  Date of admission:12/17/2022  Subjective: No acute complaints or concerns overnight. Patient reports the fetal movement as active. Patient reports uterine contraction  activity as none. Patient reports  vaginal bleeding as none. Patient describes fluid per vagina as None.  Vitals:  Blood pressure 123/69, pulse 84, temperature 98.4 F (36.9 C), temperature source Oral, resp. rate 18, height 5\' 10"  (1.778 m), weight 110.7 kg, last menstrual period 06/21/2022, SpO2 100 %, unknown if currently breastfeeding. Vitals:   12/28/22 1427 12/28/22 1746 12/28/22 2005 12/29/22 0632  BP: 137/77 131/65 (!) 145/71 123/69  Pulse: 88 (!) 102 (!) 101 84  Resp: 18 18 17 18   Temp: 98.3 F (36.8 C) 98.8 F (37.1 C) 97.6 F (36.4 C) 98.4 F (36.9 C)  TempSrc: Oral Oral Oral Oral  SpO2: 99% 100%  100%  Weight:      Height:       Physical Examination:  General appearance - alert, well appearing, and in no distress Mental status - normal mood, behavior, speech, dress, motor activity, and thought processes Chest - clear to auscultation, no wheezes, rales or rhonchi, symmetric air entry Heart - normal rate and regular rhythm Abdomen - gravid, soft and non-tender Extremities - no edema, no calf tenderness bilaterally Skin -warm and dry   Fetal Monitoring:  Baseline: 150 bpm, Variability: Moderate, Accelerations: +10x10 accels, and Decelerations: Absent    Appropriate for current gestational age  Labs:      Latest Ref Rng & Units 12/28/2022    5:02 AM 12/25/2022    5:23 AM 12/21/2022    1:07 AM  CBC  WBC 4.0 - 10.5 K/uL 11.2  10.7  11.5   Hemoglobin 12.0 - 15.0 g/dL 19.1  47.8  9.1   Hematocrit 36.0 - 46.0 % 32.3  31.4  28.8   Platelets 150 - 400 K/uL 248  238  233        Latest Ref Rng & Units 12/27/2022    5:19 AM 09/12/2022    4:19 PM 01/27/2022   10:34 PM  CMP  Glucose mg/dL 92  74  98   BUN 6 - 24 mg/dL  9  18   Creatinine 2.95 - 1.00 mg/dL  6.21  3.08   Sodium 657 - 144 mmol/L  138  137   Potassium 3.5 - 5.2 mmol/L  3.6  3.6   Chloride 96 - 106 mmol/L  102  103   CO2 20 - 29 mmol/L  22  27   Calcium 8.7 - 10.2 mg/dL  9.7  9.0   Total Protein 6.0 - 8.5 g/dL  6.7  7.1   Total Bilirubin 0.0 - 1.2 mg/dL  <8.4  0.2   Alkaline Phos 44 - 121 IU/L  70  79   AST 0 - 40 IU/L  12  22   ALT 0 - 32 IU/L  15  27      Imaging Studies:    [place] MFM OB LIMITED  Result Date: 12/25/2022 ----------------------------------------------------------------------  OBSTETRICS REPORT                        (Signed Final 12/25/2022 11:11 am) ---------------------------------------------------------------------- Patient Info  ID #:  [id number]                          D.O.B.:  1980-11-21 (41 yrs)  Name:       CATHARINE ITKIN [facility]                Visit Date: 12/25/2022 08:56 am ---------------------------------------------------------------------- Performed By  Attending:        Noralee Space MD        Referred By:       [facility]                                                              Care  Performed By:     Marcellina Millin          [facility] ---------------------------------------------------------------------- Orders  #  Description                           Code        Ordered By  1  [place] MFM OB LIMITED                     40981.19    Duane Lope ----------------------------------------------------------------------  #  Order #                     Accession #                Episode #  1  [id number]                   [id number]                 [id number] ---------------------------------------------------------------------- Indications  Premature rupture of membranes - leaking         O42.90  fluid  [redacted] weeks gestation of pregnancy                 Z3A.26  Advanced maternal age multigravida 9+,         O2.522  second trimester (41 yrs)  Cervical cerclage suture present, second        O34.32  trimester (09/22/22)  Cervical incompetence, second trimester         O34.32  (Procardia) ---------------------------------------------------------------------- Fetal Evaluation  Num Of Fetuses:          1  Fetal Heart Rate(bpm):   150  Cardiac Activity:        Observed  Presentation:            Breech  Placenta:                Anterior  Amniotic Fluid  AFI FV:      Within normal limits  Largest Pocket(cm)                              4.4 ---------------------------------------------------------------------- OB History  Gravidity:    10        Term:   3        Prem:   2        SAB:   3  Ectopic:      1        Living:  6 ---------------------------------------------------------------------- Gestational Age  LMP:           26w 5d        Date:  06/21/22                 EDD:   03/28/23  Best:          Altamese Cabal 5d     Det. By:  LMP  (06/21/22)          EDD:   03/28/23 ---------------------------------------------------------------------- Anatomy  Cranium:               Previously seen        Stomach:                Appears normal, left                                                                        sided  Ventricles:            Appears normal         Kidneys:                Appear normal  Diaphragm:             Appears normal         Bladder:                Appears normal ---------------------------------------------------------------------- Cervix Uterus Adnexa  Cervix  Not visualized (advanced GA >24wks)  Uterus  No abnormality visualized.  Right Ovary  Within normal limits.  Left Ovary  Within normal limits.  Adnexa  No abnormality visualized ---------------------------------------------------------------------- Impression  Patient is admitted with diagnosis of PPROM.  A limited  ultrasound study was performed. Deepest vertical  pocket measures 4 cm, which is normal but amniotic fluid is  subjectively decreased. Cephalic presentation. ---------------------------------------------------------------------- Recommendations  -Repeat ultrasound in a week. ----------------------------------------------------------------------                 Noralee Space, MD Electronically Signed Final Report   12/25/2022 11:11 am ----------------------------------------------------------------------    Medications:  Scheduled  aspirin  162 mg Oral Daily   docusate sodium  100 mg Oral Daily   [START ON 12/31/2022] ferrous sulfate  325 mg Oral Q72H   prenatal multivitamin  1 tablet Oral Q1200   sodium chloride flush  3 mL Intravenous Q12H   I have reviewed the patient's current medications.  ASSESSMENT: [id number] [redacted]w[redacted]d Estimated Date of Delivery: 03/28/23  Patient Active Problem List   Diagnosis Date Noted   Preterm premature rupture of membranes (PPROM) with unknown onset of labor 12/18/2022   Cervical cerclage suture present 12/18/2022   Cervical insufficiency during pregnancy in second trimester,  antepartum 09/22/2022   AMA (advanced maternal age) multigravida 35+ 09/12/2022   Supervision of high-risk pregnancy 08/20/2022   History of vaginal delivery following previous cesarean delivery 08/20/2022   History of gestational hypertension 08/20/2022   History of preterm premature rupture of membranes 08/20/2022   History of cervical cerclage 09/29/2019    PLAN: 1) PPROM-cerclage in place -S/p latency antibiotics -No evidence of infection or preterm labor -s/p BMZ -Repeat growth and AFI in 1 week, last checked 5/21  2) FWB -Continue NSTs daily, appropriate for current gestational age -Last growth on 5/14-874 g (50%) -seen by NICU  3) OB care -Normal Glucola -Continue routine antenatal care  DISPO: Continue inpatient management with plan for delivery at 34 weeks or sooner pending  maternal-fetal status  Jaynie Collins, MD Attending Obstetrician & Gynecologist, Faculty Practice Center for [employer], [facility]

## 2022-12-30 DIAGNOSIS — O0992 Supervision of high risk pregnancy, unspecified, second trimester: Secondary | ICD-10-CM | POA: Diagnosis not present

## 2022-12-30 DIAGNOSIS — O42919 Preterm premature rupture of membranes, unspecified as to length of time between rupture and onset of labor, unspecified trimester: Secondary | ICD-10-CM | POA: Diagnosis not present

## 2022-12-30 DIAGNOSIS — O3432 Maternal care for cervical incompetence, second trimester: Secondary | ICD-10-CM | POA: Diagnosis not present

## 2022-12-30 DIAGNOSIS — Z3A26 26 weeks gestation of pregnancy: Secondary | ICD-10-CM | POA: Diagnosis not present

## 2022-12-30 NOTE — Progress Notes (Signed)
FACULTY PRACTICE ANTEPARTUM(COMPREHENSIVE) NOTE  Rebecca Webster is a 42 y.o. [id number] with Estimated Date of Delivery: 03/28/23   By  date of conception [redacted]w[redacted]d  who is admitted for PPROM.    Fetal presentation is breech. Length of Stay:  12  Days  Date of admission:12/17/2022  Subjective: Patient reports no acute complaints or changes overnight.  Denies fevers or chills Patient reports the fetal movement as active. Patient reports uterine contraction  activity as none. Patient reports  vaginal bleeding as none. Patient describes fluid per vagina as None.  Vitals:  Blood pressure 124/68, pulse 93, temperature 98.1 F (36.7 C), temperature source Oral, resp. rate 18, height 5\' 10"  (1.778 m), weight 110.7 kg, last menstrual period 06/21/2022, SpO2 100 %, unknown if currently breastfeeding. Vitals:   12/29/22 2057 12/30/22 0020 12/30/22 0441 12/30/22 0939  BP: 134/72 (!) 121/56 133/66 124/68  Pulse: 98 (!) 105 89 93  Resp: 16 16 16 18   Temp: 98.2 F (36.8 C) 98.6 F (37 C) 97.7 F (36.5 C) 98.1 F (36.7 C)  TempSrc: Oral Oral Oral Oral  SpO2:  100%  100%  Weight:      Height:       Physical Examination:  General appearance - alert, well appearing, and in no distress Mental status - normal mood, behavior, speech, dress, motor activity, and thought processes Chest - CTAB Heart - normal rate and regular rhythm Abdomen -gravid, soft and nontender Extremities - no edema, no calf tenderness bilaterally Skin - warm and dry  Fetal Monitoring:  Baseline: 150 bpm, Variability: moderate, Accelerations: +10x10, and Decelerations: Absent   Appropriate for current gestational age   Medications:  Scheduled  aspirin  162 mg Oral Daily   docusate sodium  100 mg Oral Daily   [START ON 12/31/2022] ferrous sulfate  325 mg Oral Q72H   prenatal multivitamin  1 tablet Oral Q1200   sodium chloride flush  3 mL Intravenous Q12H   I have reviewed the patient's current  medications.  ASSESSMENT: [id number] [redacted]w[redacted]d Estimated Date of Delivery: 03/28/23  Patient Active Problem List   Diagnosis Date Noted   Preterm premature rupture of membranes (PPROM) with unknown onset of labor 12/18/2022   Cervical cerclage suture present 12/18/2022   Cervical insufficiency during pregnancy in second trimester, antepartum 09/22/2022   AMA (advanced maternal age) multigravida 35+ 09/12/2022   Supervision of high-risk pregnancy 08/20/2022   History of vaginal delivery following previous cesarean delivery 08/20/2022   History of gestational hypertension 08/20/2022   History of preterm premature rupture of membranes 08/20/2022   History of cervical cerclage 09/29/2019    PLAN: 1) PPROM -s/p latency antibiotics and BMZ course -No evidence of infection or labor -Plan for AFI weekly  2) FWB -Continue NSTs twice daily, NST appropriate for current gestational age -Last growth on 5/14: 874 g (50%)  -Seen by NICU  3) OB care -Continue routine antenatal care  DISPO: Continue inpatient management with plan for delivery at 34 weeks or sooner pending maternal/fetal status.  Briefly discussed with MFM, Dr. Judeth Cornfield yesterday.  If patient has normal AFI with no evidence of leakage this upcoming week may consider possible outpatient management  Myna Hidalgo, DO Attending Obstetrician & Gynecologist, Faculty Practice Center for [facility], [facility]

## 2022-12-31 DIAGNOSIS — O3432 Maternal care for cervical incompetence, second trimester: Secondary | ICD-10-CM | POA: Diagnosis not present

## 2022-12-31 DIAGNOSIS — O42919 Preterm premature rupture of membranes, unspecified as to length of time between rupture and onset of labor, unspecified trimester: Secondary | ICD-10-CM | POA: Diagnosis not present

## 2022-12-31 DIAGNOSIS — O0992 Supervision of high risk pregnancy, unspecified, second trimester: Secondary | ICD-10-CM | POA: Diagnosis not present

## 2022-12-31 DIAGNOSIS — Z3A27 27 weeks gestation of pregnancy: Secondary | ICD-10-CM | POA: Diagnosis not present

## 2022-12-31 LAB — CBC
HCT: 32.1 % — ABNORMAL LOW (ref 36.0–46.0)
Hemoglobin: 10.1 g/dL — ABNORMAL LOW (ref 12.0–15.0)
MCH: 23.5 pg — ABNORMAL LOW (ref 26.0–34.0)
MCHC: 31.5 g/dL (ref 30.0–36.0)
MCV: 74.8 fL — ABNORMAL LOW (ref 80.0–100.0)
Platelets: 252 10*3/uL (ref 150–400)
RBC: 4.29 MIL/uL (ref 3.87–5.11)
RDW: 16 % — ABNORMAL HIGH (ref 11.5–15.5)
WBC: 11.8 10*3/uL — ABNORMAL HIGH (ref 4.0–10.5)
nRBC: 0 % (ref 0.0–0.2)

## 2022-12-31 LAB — TYPE AND SCREEN
ABO/RH(D): O POS
Antibody Screen: NEGATIVE

## 2022-12-31 NOTE — Progress Notes (Signed)
FACULTY PRACTICE ANTEPARTUM(COMPREHENSIVE) NOTE  Rebecca Webster is a 42 y.o. [id number] with Estimated Date of Delivery: 03/28/23  By  LMP [redacted]w[redacted]d  who is admitted for PPROM.    Fetal presentation is breech. Length of Stay:  13  Days  Date of admission:12/17/2022  Subjective: No acute complaints or concerns overnight. Patient reports the fetal movement as active. Patient reports uterine contraction  activity as none. Patient reports  vaginal bleeding as none. Patient describes fluid per vagina as None.  Vitals:  Blood pressure 123/75, pulse 86, temperature 98 F (36.7 C), temperature source Oral, resp. rate 17, height 5\' 10"  (1.778 m), weight 110.7 kg, last menstrual period 06/21/2022, SpO2 100 %, unknown if currently breastfeeding. Vitals:   12/30/22 1802 12/30/22 1804 12/30/22 2025 12/31/22 0608  BP: 126/70 126/70 125/67 123/75  Pulse: 98 97 (!) 101 86  Resp: 17  16 17   Temp: 98.6 F (37 C)  98.3 F (36.8 C) 98 F (36.7 C)  TempSrc: Oral  Oral Oral  SpO2: 99%  99% 100%  Weight:      Height:       Physical Examination: General appearance - alert, well appearing, and in no distress Mental status - normal mood, behavior, speech, dress, motor activity, and thought processes Chest - clear to auscultation, no wheezes, rales or rhonchi, symmetric air entry Heart - normal rate and regular rhythm Abdomen - gravid, soft and non-tender Extremities - no edema, no calf tenderness bilaterally Skin -warm and dry   Fetal Monitoring:  Baseline: 150 bpm, Variability: Moderate, Accelerations: +10x10 accels, and Decelerations: Absent    Appropriate for current gestational age  Labs:      Latest Ref Rng & Units 12/31/2022    6:36 AM 12/28/2022    5:02 AM 12/25/2022    5:23 AM  CBC  WBC 4.0 - 10.5 K/uL 11.8  11.2  10.7   Hemoglobin 12.0 - 15.0 g/dL 29.5  62.1  30.8   Hematocrit 36.0 - 46.0 % 32.1  32.3  31.4   Platelets 150 - 400 K/uL 252  248  238       Latest Ref Rng & Units 12/27/2022     5:19 AM 09/12/2022    4:19 PM 01/27/2022   10:34 PM  CMP  Glucose mg/dL 92  74  98   BUN 6 - 24 mg/dL  9  18   Creatinine 6.57 - 1.00 mg/dL  8.46  9.62   Sodium 952 - 144 mmol/L  138  137   Potassium 3.5 - 5.2 mmol/L  3.6  3.6   Chloride 96 - 106 mmol/L  102  103   CO2 20 - 29 mmol/L  22  27   Calcium 8.7 - 10.2 mg/dL  9.7  9.0   Total Protein 6.0 - 8.5 g/dL  6.7  7.1   Total Bilirubin 0.0 - 1.2 mg/dL  <8.4  0.2   Alkaline Phos 44 - 121 IU/L  70  79   AST 0 - 40 IU/L  12  22   ALT 0 - 32 IU/L  15  27      Imaging Studies:    No results found.   Medications:  Scheduled  aspirin  162 mg Oral Daily   docusate sodium  100 mg Oral Daily   ferrous sulfate  325 mg Oral Q72H   prenatal multivitamin  1 tablet Oral Q1200   sodium chloride flush  3 mL Intravenous Q12H   I have  reviewed the patient's current medications.  ASSESSMENT: [id number] [redacted]w[redacted]d Estimated Date of Delivery: 03/28/23  Patient Active Problem List   Diagnosis Date Noted   Preterm premature rupture of membranes (PPROM) with unknown onset of labor 12/18/2022   Cervical cerclage suture present 12/18/2022   Cervical insufficiency during pregnancy in second trimester, antepartum 09/22/2022   AMA (advanced maternal age) multigravida 35+ 09/12/2022   Supervision of high-risk pregnancy 08/20/2022   History of vaginal delivery following previous cesarean delivery 08/20/2022   History of gestational hypertension 08/20/2022   History of preterm premature rupture of membranes 08/20/2022   History of cervical cerclage 09/29/2019    PLAN: 1) PPROM-cerclage in place -S/p latency antibiotics -No evidence of infection or preterm labor -s/p BMZ -Repeat growth and AFI in 1 week, last checked 5/21  2) FWB -Continue NSTs daily, appropriate for current gestational age -Last growth on 5/14-874 g (50%) -seen by NICU  3) OB care -Normal Glucola -Continue routine antenatal care  DISPO: Continue inpatient management with plan  for delivery at 34 weeks or sooner pending maternal/fetal status.  Briefly discussed with MFM, Dr. Judeth Cornfield on 12/29/22.  If patient has normal AFI with no evidence of leakage this upcoming week may consider possible outpatient management    Jaynie Collins, MD Attending Obstetrician & Gynecologist, Faculty Practice Center for [facility], [facility]

## 2023-01-01 ENCOUNTER — Inpatient Hospital Stay (HOSPITAL_BASED_OUTPATIENT_CLINIC_OR_DEPARTMENT_OTHER): Payer: Medicaid Other

## 2023-01-01 DIAGNOSIS — O3432 Maternal care for cervical incompetence, second trimester: Secondary | ICD-10-CM | POA: Diagnosis not present

## 2023-01-01 DIAGNOSIS — O0992 Supervision of high risk pregnancy, unspecified, second trimester: Secondary | ICD-10-CM | POA: Diagnosis not present

## 2023-01-01 DIAGNOSIS — O42919 Preterm premature rupture of membranes, unspecified as to length of time between rupture and onset of labor, unspecified trimester: Secondary | ICD-10-CM | POA: Diagnosis not present

## 2023-01-01 DIAGNOSIS — Z3A27 27 weeks gestation of pregnancy: Secondary | ICD-10-CM

## 2023-01-01 DIAGNOSIS — O09522 Supervision of elderly multigravida, second trimester: Secondary | ICD-10-CM

## 2023-01-01 DIAGNOSIS — O42912 Preterm premature rupture of membranes, unspecified as to length of time between rupture and onset of labor, second trimester: Secondary | ICD-10-CM | POA: Diagnosis not present

## 2023-01-01 NOTE — Progress Notes (Signed)
Daily Antepartum Note  Admission Date: 12/17/2022 Current Date: 01/01/2023 11:16 AM  Rebecca Webster is a 42 y.o. [id number] at [redacted]w[redacted]d, admitted for PPROM.  Pregnancy complicated by: Patient Active Problem List   Diagnosis Date Noted   Preterm premature rupture of membranes (PPROM) with unknown onset of labor 12/18/2022   Cervical cerclage suture present 12/18/2022   Cervical insufficiency during pregnancy in second trimester, antepartum 09/22/2022   AMA (advanced maternal age) multigravida 35+ 09/12/2022   Supervision of high-risk pregnancy 08/20/2022   History of vaginal delivery following previous cesarean delivery 08/20/2022   History of gestational hypertension 08/20/2022   History of preterm premature rupture of membranes 08/20/2022   History of cervical cerclage 09/29/2019    Overnight/24hr events:  none  Subjective:  No OB s/s. No LOF  Objective:    Current Vital Signs 24h Vital Sign Ranges  T 98.3 F (36.8 C) Temp  Avg: 98.2 F (36.8 C)  Min: 97.8 F (36.6 C)  Max: 98.7 F (37.1 C)  BP 119/66 BP  Min: 119/66  Max: 132/71  HR 91 Pulse  Avg: 92.3  Min: 88  Max: 98  RR 18 Resp  Avg: 18  Min: 18  Max: 18  SaO2 100 % Room Air SpO2  Avg: 100 %  Min: 100 %  Max: 100 %       24 Hour I/O Current Shift I/O  Time Ins Outs No intake/output data recorded. No intake/output data recorded.   Fetal Heart Tones: 155 baseline, no accel or decel, min to mod variability Tocometry: toco quiet x 55m  Physical exam: General: Well nourished, well developed female in no acute distress. Abdomen: gravid nttp Respiratory: No respiratory distress Extremities: no clubbing, cyanosis or edema Skin: Warm and dry.   Medications: Current Facility-Administered Medications  Medication Dose Route Frequency Provider Last Rate Last Admin   acetaminophen (TYLENOL) tablet 1,000 mg  1,000 mg Oral Q6H PRN Lennart Pall, MD       aspirin chewable tablet 162 mg  162 mg Oral Daily Harvie Bridge C, MD   162 mg at 01/01/23 1037   calcium carbonate (TUMS - dosed in mg elemental calcium) chewable tablet 400 mg of elemental calcium  2 tablet Oral Q4H PRN Harvie Bridge C, MD   400 mg of elemental calcium at 12/24/22 1839   cyclobenzaprine (FLEXERIL) tablet 10 mg  10 mg Oral TID PRN Lennart Pall, MD       docusate sodium (COLACE) capsule 100 mg  100 mg Oral Daily Harvie Bridge C, MD   100 mg at 01/01/23 1037   famotidine (PEPCID) tablet 20 mg  20 mg Oral BID PRN Myna Hidalgo, DO       ferrous sulfate tablet 325 mg  325 mg Oral Q72H Anyanwu, Ugonna A, MD   325 mg at 12/31/22 0952   polyethylene glycol (MIRALAX / GLYCOLAX) packet 17 g  17 g Oral Daily PRN Lennart Pall, MD   17 g at 12/30/22 0954   prenatal multivitamin tablet 1 tablet  1 tablet Oral Q1200 Lennart Pall, MD   1 tablet at 12/31/22 5409   senna (SENOKOT) tablet 8.6 mg  1 tablet Oral Daily PRN Lennart Pall, MD       simethicone [facility]) chewable tablet 80 mg  80 mg Oral QID PRN Warden Fillers, MD   80 mg at 12/27/22 1930   sodium chloride flush (NS) 0.9 % injection 3 mL  3 mL  Intravenous Q12H Warden Fillers, MD   3 mL at 01/01/23 1037    Labs: no new labs  Radiology:  5/21: ceph, afi wnl 5/17: transverse, normal af level 5/14: cephalic, 50%, efw 874g, ac 60%, afi wnl  Assessment & Plan:  Patient stable  *Pregnancy: EFM appropriate for gestational age *PPROM: inpatient until delivery, goal for 34wks at least. Qwk AFIs recommended by MFM, ordered for today -s/p aminsure + x 2 & s/p latency abx, flagyl x 7d, diflucan x 1  *Preterm: Mg PRN for fetal NP -s/p bmz on 5/14 and 5/15.  -s/p NICU consult *Cerclage: prophylactic mcdonald cerclage with ticron stitch still in place. Remove for any s/s of labor or need for delivery.  H/o GHTN: continue low dose ASA *AMA: no issues *h/o VBAC: consent signed 5/16 *PPx: SCDs, OOB ad lib  Cornelia Copa MD Attending Center for  [facility] (Faculty Practice) GYN Consult Phone: [phone] (M-F, [phone]) & [phone]  (Off hours, weekends, holidays)

## 2023-01-02 DIAGNOSIS — O42919 Preterm premature rupture of membranes, unspecified as to length of time between rupture and onset of labor, unspecified trimester: Secondary | ICD-10-CM | POA: Diagnosis not present

## 2023-01-02 DIAGNOSIS — Z3A27 27 weeks gestation of pregnancy: Secondary | ICD-10-CM | POA: Diagnosis not present

## 2023-01-02 DIAGNOSIS — O42912 Preterm premature rupture of membranes, unspecified as to length of time between rupture and onset of labor, second trimester: Secondary | ICD-10-CM | POA: Diagnosis not present

## 2023-01-02 NOTE — Progress Notes (Signed)
Daily Antepartum Note  Admission Date: 12/17/2022 Current Date: 01/02/2023 2:20 PM  Rebecca Webster is a 42 y.o. [id number] at [redacted]w[redacted]d, admitted for PPROM.  Pregnancy complicated by: Patient Active Problem List   Diagnosis Date Noted   Preterm premature rupture of membranes (PPROM) with unknown onset of labor 12/18/2022   Cervical cerclage suture present 12/18/2022   Cervical insufficiency during pregnancy in second trimester, antepartum 09/22/2022   AMA (advanced maternal age) multigravida 35+ 09/12/2022   Supervision of high-risk pregnancy 08/20/2022   History of vaginal delivery following previous cesarean delivery 08/20/2022   History of gestational hypertension 08/20/2022   History of preterm premature rupture of membranes 08/20/2022   History of cervical cerclage 09/29/2019    Overnight/24hr events:  none  Subjective:  No OB s/s. Patient continues to deny any LOF  Objective:    Current Vital Signs 24h Vital Sign Ranges  T 98.2 F (36.8 C) Temp  Avg: 98.4 F (36.9 C)  Min: 98.2 F (36.8 C)  Max: 98.6 F (37 C)  BP 127/75 BP  Min: 127/75  Max: 148/83  HR 88 Pulse  Avg: 91  Min: 88  Max: 93  RR 18 Resp  Avg: 18.3  Min: 18  Max: 19  SaO2 100 % Room Air SpO2  Avg: 100 %  Min: 100 %  Max: 100 %       24 Hour I/O Current Shift I/O  Time Ins Outs No intake/output data recorded. No intake/output data recorded.   Fetal Heart Tones: 140 baseline, + accels, no decel, min to mod variability Tocometry: toco quiet x 68m  Physical exam: General: Well nourished, well developed female in no acute distress. Abdomen: gravid nttp Respiratory: No respiratory distress Extremities: no clubbing, cyanosis or edema Skin: Warm and dry.   Medications: Current Facility-Administered Medications  Medication Dose Route Frequency Provider Last Rate Last Admin   acetaminophen (TYLENOL) tablet 1,000 mg  1,000 mg Oral Q6H PRN Lennart Pall, MD       aspirin chewable tablet 162 mg  162 mg  Oral Daily Harvie Bridge C, MD   162 mg at 01/02/23 1028   calcium carbonate (TUMS - dosed in mg elemental calcium) chewable tablet 400 mg of elemental calcium  2 tablet Oral Q4H PRN Harvie Bridge C, MD   400 mg of elemental calcium at 12/24/22 1839   cyclobenzaprine (FLEXERIL) tablet 10 mg  10 mg Oral TID PRN Lennart Pall, MD       docusate sodium (COLACE) capsule 100 mg  100 mg Oral Daily Harvie Bridge C, MD   100 mg at 01/02/23 1028   famotidine (PEPCID) tablet 20 mg  20 mg Oral BID PRN Myna Hidalgo, DO       ferrous sulfate tablet 325 mg  325 mg Oral Q72H Anyanwu, Ugonna A, MD   325 mg at 12/31/22 0952   polyethylene glycol (MIRALAX / GLYCOLAX) packet 17 g  17 g Oral Daily PRN Lennart Pall, MD   17 g at 12/30/22 0954   prenatal multivitamin tablet 1 tablet  1 tablet Oral Q1200 Lennart Pall, MD   1 tablet at 01/02/23 1028   senna (SENOKOT) tablet 8.6 mg  1 tablet Oral Daily PRN Lennart Pall, MD       simethicone [facility]) chewable tablet 80 mg  80 mg Oral QID PRN Warden Fillers, MD   80 mg at 12/27/22 1930   sodium chloride flush (NS) 0.9 % injection 3  mL  3 mL Intravenous Q12H Warden Fillers, MD   3 mL at 01/02/23 1029    Labs: no new labs  Radiology:  5/28 (prelim): ceph, af low normal with 2.7cm largest pocket 5/21: ceph, afi wnl 5/17: transverse, normal af level 5/14: cephalic, 50%, efw 874g, ac 60%, afi wnl  Assessment & Plan:  Patient stable  *Pregnancy: EFM appropriate for gestational age *PPROM: inpatient until delivery, goal for 34wks at least. Qwk AFIs recommended by MFM, next one due 6/4. D/w her re: talking to MFM about a dye test, but she declines this.  -s/p aminsure + x 2 & s/p latency abx, flagyl x 7d, diflucan x 1  *Preterm: Mg PRN for fetal NP -s/p bmz on 5/14 and 5/15.  -s/p NICU consult *Cerclage: prophylactic mcdonald cerclage with ticron stitch still in place. Remove for any s/s of labor or need for delivery.  H/o  GHTN: continue low dose ASA *AMA: no issues *h/o VBAC: consent signed 5/16 *PPx: SCDs, OOB ad lib  Cornelia Copa MD Attending Center for [facility] (Faculty Practice) GYN Consult Phone: [phone] (M-F, [phone]) & [phone]  (Off hours, weekends, holidays)

## 2023-01-02 NOTE — Consult Note (Signed)
MFM Note  Rebecca Webster has been hospitalized due to PPROM which was confirmed with two AmniSure tests.  She has a cerclage in place.  She is currently at 27 weeks and 6 days.  The patient continues to deny any leakage of fluid.  Her fetal testing has been reassuring.  An ultrasound performed yesterday showed decreased amniotic fluid compared to her prior ultrasound exams.  There was only a single 2.7 cm pocket of fluid noted around the fetus.  The patient was advised that due to the decreased amniotic fluid, she most likely has ruptured membranes and will require continued inpatient management until delivery.  The goal for her delivery will be at around 34 weeks.    She should continue daily fetal testing with weekly ultrasounds to check the amniotic fluid.  She should have another growth ultrasound performed in 2 weeks.    The patient stated that all of her questions were answered today.

## 2023-01-03 DIAGNOSIS — O42913 Preterm premature rupture of membranes, unspecified as to length of time between rupture and onset of labor, third trimester: Secondary | ICD-10-CM | POA: Diagnosis not present

## 2023-01-03 DIAGNOSIS — O3433 Maternal care for cervical incompetence, third trimester: Secondary | ICD-10-CM

## 2023-01-03 DIAGNOSIS — Z3A28 28 weeks gestation of pregnancy: Secondary | ICD-10-CM | POA: Diagnosis not present

## 2023-01-03 NOTE — Progress Notes (Signed)
Patient ID: Rebecca Webster, female   DOB: April 07, 1981, 42 y.o.   MRN: [id number] Daily Antepartum Note  Admission Date: 12/17/2022 Current Date: 01/03/2023 10:48 AM  Rebecca Webster is a 42 y.o. [id number] at [redacted]w[redacted]d, admitted for PPROM.  Pregnancy complicated by: Patient Active Problem List   Diagnosis Date Noted   Preterm premature rupture of membranes (PPROM) with unknown onset of labor 12/18/2022   Cervical cerclage suture present 12/18/2022   Cervical insufficiency during pregnancy in second trimester, antepartum 09/22/2022   AMA (advanced maternal age) multigravida 35+ 09/12/2022   Supervision of high-risk pregnancy 08/20/2022   History of vaginal delivery following previous cesarean delivery 08/20/2022   History of gestational hypertension 08/20/2022   History of preterm premature rupture of membranes 08/20/2022   History of cervical cerclage 09/29/2019    Overnight/24hr events:  none  Subjective:  Patient reports feeling well and is without complaints. She reports good fetal movement. She denies abdominal pain/contractions, leakage of fluid or vaginal bleeding  Objective:    Current Vital Signs 24h Vital Sign Ranges  T 97.7 F (36.5 C) Temp  Avg: 98.2 F (36.8 C)  Min: 97.7 F (36.5 C)  Max: 98.9 F (37.2 C)  BP 118/71 BP  Min: 118/71  Max: 139/65  HR 88 Pulse  Avg: 94.8  Min: 88  Max: 102  RR 14 Resp  Avg: 16.8  Min: 14  Max: 18  SaO2 100 % Room Air SpO2  Avg: 100 %  Min: 100 %  Max: 100 %       24 Hour I/O Current Shift I/O  Time Ins Outs No intake/output data recorded. No intake/output data recorded.   Fetal Heart Tones: 150 baseline, + accels, no decel, min to mod variability Tocometry: no contractions  Physical exam: General: Well nourished, well developed female in no acute distress. Abdomen: gravid nttp Respiratory: No respiratory distress Extremities: no clubbing, cyanosis or edema Skin: Warm and dry.   Medications: Current Facility-Administered  Medications  Medication Dose Route Frequency Provider Last Rate Last Admin   acetaminophen (TYLENOL) tablet 1,000 mg  1,000 mg Oral Q6H PRN Lennart Pall, MD       aspirin chewable tablet 162 mg  162 mg Oral Daily Lennart Pall, MD   162 mg at 01/03/23 1041   calcium carbonate (TUMS - dosed in mg elemental calcium) chewable tablet 400 mg of elemental calcium  2 tablet Oral Q4H PRN Lennart Pall, MD   400 mg of elemental calcium at 12/24/22 1839   cyclobenzaprine (FLEXERIL) tablet 10 mg  10 mg Oral TID PRN Lennart Pall, MD       docusate sodium (COLACE) capsule 100 mg  100 mg Oral Daily Harvie Bridge C, MD   100 mg at 01/03/23 1041   famotidine (PEPCID) tablet 20 mg  20 mg Oral BID PRN Myna Hidalgo, DO       ferrous sulfate tablet 325 mg  325 mg Oral Q72H Anyanwu, Ugonna A, MD   325 mg at 01/03/23 1041   polyethylene glycol (MIRALAX / GLYCOLAX) packet 17 g  17 g Oral Daily PRN Lennart Pall, MD   17 g at 12/30/22 0954   prenatal multivitamin tablet 1 tablet  1 tablet Oral Q1200 Lennart Pall, MD   1 tablet at 01/02/23 1028   senna (SENOKOT) tablet 8.6 mg  1 tablet Oral Daily PRN Lennart Pall, MD       simethicone [facility]) chewable tablet 80  mg  80 mg Oral QID PRN Warden Fillers, MD   80 mg at 12/27/22 1930   sodium chloride flush (NS) 0.9 % injection 3 mL  3 mL Intravenous Q12H Warden Fillers, MD   3 mL at 01/03/23 1042    Labs: no new labs  Radiology:  5/28: ceph, af low normal with 2.7cm largest pocket 5/21: ceph, afi wnl 5/17: transverse, normal af level 5/14: cephalic, 50%, efw 874g, ac 60%, afi wnl  Assessment & Plan:  Patient stable  *Pregnancy: EFM appropriate for gestational age *PPROM: inpatient until delivery, goal for 34wks at least. Qwk AFIs recommended by MFM, next one due 6/4.  -s/p aminsure + x 2 & s/p latency abx, flagyl x 7d, diflucan x 1  *Preterm: Mg PRN for fetal NP -s/p bmz on 5/14 and 5/15.  -s/p NICU  consult *Cerclage: prophylactic mcdonald cerclage with ticron stitch still in place. Remove for any s/s of labor or need for delivery.  H/o GHTN: continue low dose ASA *AMA: no issues *h/o VBAC: consent signed 5/16 *PPx: SCDs, OOB ad lib  Catalina Antigua MD Attending [facility] [employer] [facility])

## 2023-01-04 DIAGNOSIS — Z3A28 28 weeks gestation of pregnancy: Secondary | ICD-10-CM | POA: Diagnosis not present

## 2023-01-04 DIAGNOSIS — O42913 Preterm premature rupture of membranes, unspecified as to length of time between rupture and onset of labor, third trimester: Secondary | ICD-10-CM | POA: Diagnosis not present

## 2023-01-04 NOTE — Progress Notes (Signed)
Patient ID: Rebecca Webster, female   DOB: 26-May-1981, 42 y.o.   MRN: [id number] FACULTY PRACTICE ANTEPARTUM(COMPREHENSIVE) NOTE  Rebecca Webster is a 42 y.o. [id number] with Estimated Date of Delivery: 03/28/23   By  early ultrasound [redacted]w[redacted]d  who is admitted for PROM.    Fetal presentation is breech. Length of Stay:  17  Days  Date of admission:12/17/2022  Subjective: No complaints Patient reports the fetal movement as active. Patient reports uterine contraction  activity as none. Patient reports  vaginal bleeding as none. Patient describes fluid per vagina as Clear.  Vitals:  Blood pressure 123/71, pulse 85, temperature 98.1 F (36.7 C), temperature source Oral, resp. rate 15, height 5\' 10"  (1.778 m), weight 110.7 kg, last menstrual period 06/21/2022, SpO2 100 %, unknown if currently breastfeeding. Vitals:   01/03/23 1148 01/03/23 1638 01/03/23 2010 01/04/23 0620  BP: 136/76 132/74 (!) 149/77 123/71  Pulse: 91 97 92 85  Resp: 14 13 15 15   Temp: 98.3 F (36.8 C) 98 F (36.7 C) 98.6 F (37 C) 98.1 F (36.7 C)  TempSrc: Oral Oral Oral Oral  SpO2: 100% 100% 100% 100%  Weight:      Height:       Physical Examination:  General appearance - alert, well appearing, and in no distress Abdomen - soft, nontender, nondistended, no masses or organomegaly Fundal Height:  size equals dates Pelvic Exam:  examination not indicated Cervical Exam: Not evaluated. . Extremities: extremities normal, atraumatic, no cyanosis or edema with DTRs 2+ bilaterally Membranes:ruptured, clear fluid  Fetal Monitoring:  Baseline: 140 bpm, Variability: Good {> 6 bpm), and Accelerations: Non-reactive but appropriate for gestational age   reactive  Labs:  No results found for this or any previous visit (from the past 24 hour(s)).  Imaging Studies:    [place] MFM OB LIMITED  Result Date: 01/02/2023 ----------------------------------------------------------------------  OBSTETRICS REPORT                       (Signed  Final 01/02/2023 03:42 pm) ---------------------------------------------------------------------- Patient Info  ID #:       [id number]                          D.O.B.:  11/17/1980 (41 yrs)  Name:       Rebecca Webster                Visit Date: 01/01/2023 03:02 pm ---------------------------------------------------------------------- Performed By  Attending:        Ma Rings MD         Referred By:      [facility]                                                             Care  Performed By:     Percell Boston          [facility] ---------------------------------------------------------------------- Orders  #  Description                           Code        Ordered By  1  [place] MFM OB LIMITED                     [id number]    Rebecca Webster ----------------------------------------------------------------------  #  Order #                     Accession #                Episode #  1  [id number]                   [id number]                 [id number] ---------------------------------------------------------------------- Indications  Premature rupture of membranes - leaking       O42.90  fluid  Advanced maternal age multigravida 27+,        O29.522  second trimester (41 yrs)  Cervical cerclage suture present, second       O34.32  trimester (09/22/22)  Cervical incompetence, second trimester        O34.32  (Procardia)  [redacted] weeks gestation of pregnancy                Z3A.27 ---------------------------------------------------------------------- Fetal Evaluation  Num Of Fetuses:         1  Fetal Heart Rate(bpm):  153  Cardiac Activity:       Observed  Presentation:           Cephalic  Placenta:               Anterior  P. Cord Insertion:      Previously visualized  Amniotic Fluid  AFI FV:      Subjectively low-normal                              Largest Pocket(cm)                              2.7  ---------------------------------------------------------------------- OB History  Gravidity:    10        Term:   3        Prem:   2        SAB:   3  Ectopic:      1        Living:  6 ---------------------------------------------------------------------- Gestational Age  LMP:           27w 5d        Date:  06/21/22                 EDD:   03/28/23  Best:          27w 5d     Det. By:  LMP  (06/21/22)          EDD:   03/28/23 ---------------------------------------------------------------------- Anatomy  Cranium:               Appears normal         Stomach:                Appears normal, left  sided  Thoracic:              Appears normal         Kidneys:                Appear normal  Diaphragm:             Appears normal         Bladder:                Appears normal ---------------------------------------------------------------------- Cervix Uterus Adnexa  Cervix  Not visualized (advanced GA >24wks)  Uterus  No abnormality visualized.  Right Ovary  No adnexal mass visualized.  Left Ovary  No adnexal mass visualized.  Cul De Sac  No free fluid seen.  Adnexa  No abnormality visualized ---------------------------------------------------------------------- Comments  Rebecca Webster has been hospitalized due to PPROM which  was confirmed with two AmniSure tests.  She has a cerclage  in place.  She is currently at 27 weeks and 6 days.  The patient continues to deny any leakage of fluid.  Her fetal  testing has been reassuring.  An ultrasound performed yesterday showed decreased  amniotic fluid compared to her prior ultrasound exams.  There  was only a single 2.7 cm pocket of fluid noted around the  fetus.  The patient was advised that due to the decreased amniotic  fluid, she most likely has ruptured membranes and will  require continued inpatient management until delivery.  The goal for her delivery will be at around 34 weeks.  She should continue daily  fetal testing with weekly  ultrasounds to check the amniotic fluid.  She should have  another growth ultrasound performed in 2 weeks.  The patient stated that all of her questions were answered  today. ----------------------------------------------------------------------                   Ma Rings, MD Electronically Signed Final Report   01/02/2023 03:42 pm ----------------------------------------------------------------------    Medications:  Scheduled  aspirin  162 mg Oral Daily   docusate sodium  100 mg Oral Daily   ferrous sulfate  325 mg Oral Q72H   prenatal multivitamin  1 tablet Oral Q1200   sodium chloride flush  3 mL Intravenous Q12H   I have reviewed the patient's current medications.  ASSESSMENT: [id number] [redacted]w[redacted]d Estimated Date of Delivery: 03/28/23  P/PROM-->breech Cervical insufficiency-->cerclage is still in place  PLAN: no change in care plan *Pregnancy: EFM appropriate for gestational age *PPROM: inpatient until delivery, goal for 34wks at least. Qwk AFIs recommended by MFM, next one due 6/4.  -s/p aminsure + x 2 & s/p latency abx, flagyl x 7d, diflucan x 1  *Preterm: Mg PRN for fetal NP -s/p bmz on 5/14 and 5/15.  -s/p NICU consult *Cerclage: prophylactic mcdonald cerclage with ticron stitch still in place. Remove for any s/s of labor or need for delivery.  H/o GHTN: continue low dose ASA *AMA: no issues *h/o VBAC: consent signed 5/16 *PPx: SCDs, OOB ad lib  Amaryllis Dyke Rashunda Passon 01/04/2023,8:17 AM

## 2023-01-05 LAB — CBC
HCT: 30.7 % — ABNORMAL LOW (ref 36.0–46.0)
Hemoglobin: 9.6 g/dL — ABNORMAL LOW (ref 12.0–15.0)
MCH: 23.2 pg — ABNORMAL LOW (ref 26.0–34.0)
MCHC: 31.3 g/dL (ref 30.0–36.0)
MCV: 74.3 fL — ABNORMAL LOW (ref 80.0–100.0)
Platelets: 200 10*3/uL (ref 150–400)
RBC: 4.13 MIL/uL (ref 3.87–5.11)
RDW: 16.3 % — ABNORMAL HIGH (ref 11.5–15.5)
WBC: 8.9 10*3/uL (ref 4.0–10.5)
nRBC: 0 % (ref 0.0–0.2)

## 2023-01-05 LAB — TYPE AND SCREEN
ABO/RH(D): O POS
Antibody Screen: NEGATIVE

## 2023-01-06 DIAGNOSIS — Z3A28 28 weeks gestation of pregnancy: Secondary | ICD-10-CM | POA: Diagnosis not present

## 2023-01-06 DIAGNOSIS — O42913 Preterm premature rupture of membranes, unspecified as to length of time between rupture and onset of labor, third trimester: Secondary | ICD-10-CM | POA: Diagnosis not present

## 2023-01-06 NOTE — Progress Notes (Signed)
Patient ID: Rebecca Webster, female   DOB: [birth year], 42 y.o.   MRN: [id number] FACULTY PRACTICE ANTEPARTUM(COMPREHENSIVE) NOTE  Rebecca Webster is a 42 y.o. [id number] at [redacted]w[redacted]d by best clinical estimate who is admitted for PROM.   Fetal presentation is cephalic. Length of Stay:  19  Days  ASSESSMENT: Principal Problem:   Preterm premature rupture of membranes (PPROM) with unknown onset of labor Active Problems:   Supervision of high-risk pregnancy   History of vaginal delivery following previous cesarean delivery   Cervical insufficiency during pregnancy in second trimester, antepartum   Cervical cerclage suture present   PLAN: Delivery with S/Sx's of infection S/p BMZ Cerclage in place Latency Abx Magnesium for CP ppx if labors Low dose ASA VBAC consent  Subjective: Patient is without complaints Patient reports the fetal movement as active. Patient reports uterine contraction  activity as none. Patient reports  vaginal bleeding as none. Patient describes fluid per vagina as None.  Vitals:  Blood pressure 135/73, pulse 86, temperature 98.2 F (36.8 C), temperature source Oral, resp. rate 18, height 5\' 10"  (1.778 m), weight 110.7 kg, last menstrual period 06/21/2022, SpO2 98 %, unknown if currently breastfeeding. Physical Examination:  General appearance - alert, well appearing, and in no distress Chest - normal effort Abdomen - gravid, non-tender Fundal Height:  size equals dates Extremities: extremities normal, atraumatic, no cyanosis or edema  Membranes:ruptured  Fetal Monitoring:  Baseline: 150 bpm, Variability: Good {> 6 bpm), Accelerations: Non-reactive but appropriate for gestational age, and Decelerations: Absent  Labs:  No results found for this or any previous visit (from the past 24 hour(s)).  Imaging Studies:    Vtx 874 gm 50 %, nml AFI  Medications:  Scheduled  aspirin  162 mg Oral Daily   docusate sodium  100 mg Oral Daily   ferrous sulfate  325 mg  Oral Q72H   prenatal multivitamin  1 tablet Oral Q1200   sodium chloride flush  3 mL Intravenous Q12H   I have reviewed the patient's current medications.   Rebecca Bores, MD 01/06/2023,8:48 AM

## 2023-01-07 DIAGNOSIS — O42913 Preterm premature rupture of membranes, unspecified as to length of time between rupture and onset of labor, third trimester: Secondary | ICD-10-CM

## 2023-01-07 DIAGNOSIS — Z3A28 28 weeks gestation of pregnancy: Secondary | ICD-10-CM | POA: Diagnosis not present

## 2023-01-07 DIAGNOSIS — O3433 Maternal care for cervical incompetence, third trimester: Secondary | ICD-10-CM

## 2023-01-07 NOTE — Progress Notes (Signed)
Maternal-Fetal Medicine   Name: Rebecca Webster DOB: November 18, 1980 MRN: [id number]  Patient was admitted to [facility] specialty on 12/18/2022, with leakage of amniotic fluid.  Subsequently, a diagnosis of PPROM was made.  Most recent ultrasound showed oligohydramnios.  Fetal growth assessment performed 2 weeks ago was normal. She is G10 P6 and had prophylactic cerclage in this pregnancy at [redacted] weeks gestation because of 21-week pregnancy loss and a previous pregnancy. Patient does not give history of vaginal bleeding or uterine contractions.  She had MFM consultation.  Patient had received a course of betamethasone.  She had neonatology consultation. Patient reports that screening for gestational diabetes was incomplete (1 hour blood draw was not done).  P/E: Patient is comfortably lying in bed; not in distress. Vital stable.  Afebrile.  Blood pressure 124/72 mmHg. Abdomen: Soft, gravid uterus; no tenderness. No pedal edema. Labs: WBC 8.9, hemoglobin 9.8, hematocrit 30.7, platelets 200.  We will continue inpatient management because of diagnosis of preterm premature rupture of membranes.  She does not have symptoms of fever or abdominal pain. I counseled the patient on possible complications including placental abruption and intra amniotic infection, and if they are present, she will be delivered earlier than [redacted] weeks gestation. I reassured her of normal examination findings.  Recommendations -Weekly ultrasound. -BPP from [redacted] weeks gestation. -Delivery at [redacted] weeks gestation. -Gestational diabetes screening 7 to 10 days after the second dose of betamethasone.  Thank you for consultation.  If you have any questions or concerns, please contact me the [facility].  Consultation including face-to-face counseling 25 minutes.

## 2023-01-07 NOTE — Progress Notes (Signed)
Patient ID: Rebecca Webster, female   DOB: [birth year], 42 y.o.   MRN: [id number] FACULTY PRACTICE ANTEPARTUM(COMPREHENSIVE) NOTE  Rebecca Webster is a 42 y.o. [id number] at [redacted]w[redacted]d by best clinical estimate who is admitted for PROM.   Fetal presentation is cephalic. Length of Stay:  20  Days  ASSESSMENT: Principal Problem:   Preterm premature rupture of membranes (PPROM) with unknown onset of labor Active Problems:   Supervision of high-risk pregnancy   History of vaginal delivery following previous cesarean delivery   Cervical insufficiency during pregnancy in second trimester, antepartum   Cervical cerclage suture present   PLAN: Delivery with S/Sx's of infection S/p BMZ Cerclage in place Latency Abx Magnesium for CP ppx if labors Low dose ASA VBAC consent  Subjective: Denies C/o's Patient reports the fetal movement as active. Patient reports uterine contraction  activity as none. Patient reports  vaginal bleeding as none. Patient describes fluid per vagina as None.  Vitals:  Blood pressure 136/74, pulse 86, temperature 98.6 F (37 C), temperature source Oral, resp. rate 16, height 5\' 10"  (1.778 m), weight 110.7 kg, last menstrual period 06/21/2022, SpO2 100 %, unknown if currently breastfeeding. Physical Examination:  General appearance - alert, well appearing, and in no distress Chest - normal effort Abdomen - gravid, non-tender Fundal Height:  size equals dates Extremities: Homans sign is negative, no sign of DVT  Membranes:ruptured  Fetal Monitoring:  Baseline: 140 bpm, Variability: Good {> 6 bpm), Accelerations: Reactive, and Decelerations: Absent  Labs:  No results found for this or any previous visit (from the past 24 hour(s)).  Imaging Studies:    Rebecca Webster 874 gm 50 %, nml AFI  Medications:  Scheduled  aspirin  162 mg Oral Daily   docusate sodium  100 mg Oral Daily   ferrous sulfate  325 mg Oral Q72H   prenatal multivitamin  1 tablet Oral Q1200   sodium  chloride flush  3 mL Intravenous Q12H   I have reviewed the patient's current medications.   Reva Bores, MD 01/07/2023,7:27 AM

## 2023-01-08 DIAGNOSIS — O42913 Preterm premature rupture of membranes, unspecified as to length of time between rupture and onset of labor, third trimester: Secondary | ICD-10-CM | POA: Diagnosis not present

## 2023-01-08 DIAGNOSIS — Z3A28 28 weeks gestation of pregnancy: Secondary | ICD-10-CM | POA: Diagnosis not present

## 2023-01-08 LAB — CBC
HCT: 29.9 % — ABNORMAL LOW (ref 36.0–46.0)
Hemoglobin: 9.5 g/dL — ABNORMAL LOW (ref 12.0–15.0)
MCH: 23.9 pg — ABNORMAL LOW (ref 26.0–34.0)
MCHC: 31.8 g/dL (ref 30.0–36.0)
MCV: 75.3 fL — ABNORMAL LOW (ref 80.0–100.0)
Platelets: 187 10*3/uL (ref 150–400)
RBC: 3.97 MIL/uL (ref 3.87–5.11)
RDW: 16.8 % — ABNORMAL HIGH (ref 11.5–15.5)
WBC: 8.1 10*3/uL (ref 4.0–10.5)
nRBC: 0 % (ref 0.0–0.2)

## 2023-01-08 LAB — TYPE AND SCREEN
ABO/RH(D): O POS
Antibody Screen: NEGATIVE

## 2023-01-08 NOTE — Progress Notes (Signed)
Patient ID: Rebecca Webster, female   DOB: [birth year], 42 y.o.   MRN: [id number] ACULTY PRACTICE ANTEPARTUM COMPREHENSIVE PROGRESS NOTE  Rebecca Webster is a 42 y.o. [id number] at [redacted]w[redacted]d  who is admitted for PROM in setting of cerclage Fetal presentation is cephalic. Length of Stay:  21  Days  Subjective: Pt has no complaints this morning Patient reports good fetal movement.  She reports no uterine contractions, no bleeding and no loss of fluid per vagina.  Vitals:  Blood pressure 122/67, pulse 95, temperature 98.1 F (36.7 C), temperature source Oral, resp. rate 16, height 5\' 10"  (1.778 m), weight 110.7 kg, last menstrual period 06/21/2022, SpO2 99 %, unknown if currently breastfeeding.  Physical Examination: Lungs clear  Heart RRR And soft + BS gravid non tender Ext non tender  Fetal Monitoring:  Baseline: 140 bpm, Variability: Good {> 6 bpm), Accelerations: Reactive, and Decelerations: Absent  Labs:  Results for orders placed or performed during the hospital encounter of 12/17/22 (from the past 24 hour(s))  Type and screen [facility]   Collection Time: 01/08/23  5:35 AM  Result Value Ref Range   ABO/RH(D) O POS    Antibody Screen NEG    Sample Expiration      01/11/2023,2359 Performed at [facility], [address], [place], [place] [address]   CBC   Collection Time: 01/08/23  5:37 AM  Result Value Ref Range   WBC 8.1 4.0 - 10.5 K/uL   RBC 3.97 3.87 - 5.11 MIL/uL   Hemoglobin 9.5 (L) 12.0 - 15.0 g/dL   HCT 56.2 (L) 13.0 - 86.5 %   MCV 75.3 (L) 80.0 - 100.0 fL   MCH 23.9 (L) 26.0 - 34.0 pg   MCHC 31.8 30.0 - 36.0 g/dL   RDW 78.4 (H) 69.6 - 29.5 %   Platelets 187 150 - 400 K/uL   nRBC 0.0 0.0 - 0.2 %    Imaging Studies:    NA   Medications:  Scheduled  aspirin  162 mg Oral Daily   docusate sodium  100 mg Oral Daily   ferrous sulfate  325 mg Oral Q72H   prenatal multivitamin  1 tablet Oral Q1200   sodium chloride flush  3 mL Intravenous  Q12H   I have reviewed the patient's current medications.  ASSESSMENT: IUP 28 5/7 weeks PPROM Cerclage in situ VBAC x 2, consent for TOLAC   PLAN: Stable. S/P Mag, BMX and antibiotics. No S/Sx of infection or PTL. Fetal well being reassuring. Delivery at 34 weeks or for maternal/fetal indications Continue routine antenatal care.   Hermina Staggers 01/08/2023,11:13 AM

## 2023-01-09 ENCOUNTER — Inpatient Hospital Stay (HOSPITAL_BASED_OUTPATIENT_CLINIC_OR_DEPARTMENT_OTHER): Payer: Medicaid Other

## 2023-01-09 DIAGNOSIS — Z3A28 28 weeks gestation of pregnancy: Secondary | ICD-10-CM

## 2023-01-09 DIAGNOSIS — O42913 Preterm premature rupture of membranes, unspecified as to length of time between rupture and onset of labor, third trimester: Secondary | ICD-10-CM

## 2023-01-09 DIAGNOSIS — O09523 Supervision of elderly multigravida, third trimester: Secondary | ICD-10-CM | POA: Diagnosis not present

## 2023-01-09 DIAGNOSIS — O3433 Maternal care for cervical incompetence, third trimester: Secondary | ICD-10-CM

## 2023-01-09 NOTE — Progress Notes (Signed)
Patient ID: Rebecca Webster, female   DOB: February 07, 1981, 42 y.o.   MRN: [id number] ACULTY PRACTICE ANTEPARTUM COMPREHENSIVE PROGRESS NOTE  Rebecca Webster is a 42 y.o. [id number] at [redacted]w[redacted]d  who is admitted for PROM in setting of cerclage Fetal presentation is cephalic. Length of Stay:  22  Days  Subjective: Pt has no complaints this morning Patient reports good fetal movement.  She reports no uterine contractions, no bleeding and no loss of fluid per vagina.  Vitals:  Blood pressure 137/83, pulse 92, temperature 98.1 F (36.7 C), temperature source Oral, resp. rate 18, height 5\' 10"  (1.778 m), weight 110.7 kg, last menstrual period 06/21/2022, SpO2 100 %, unknown if currently breastfeeding.  Physical Examination: Lungs clear  Heart RRR Abd soft + BS gravid non tender Ext non tender  Fetal Monitoring:  Baseline: 150-160's bpm, Variability: Fair (1-6 bpm), Accelerations: Non-reactive but appropriate for gestational age, and Decelerations: Absent  Labs:  No results found for this or any previous visit (from the past 24 hour(s)).  Imaging Studies:    NA   Medications:  Scheduled  aspirin  162 mg Oral Daily   docusate sodium  100 mg Oral Daily   ferrous sulfate  325 mg Oral Q72H   prenatal multivitamin  1 tablet Oral Q1200   sodium chloride flush  3 mL Intravenous Q12H   I have reviewed the patient's current medications.  ASSESSMENT: IUP 28 6/7 weeks PROM Cerclage in situ VBAC x 2, consented for TOLAC  PLAN: Stable. S/P Mag, BMZ and antibiotics, No S/Sx of infection or PTL. F/U U/S today Fetal well being reassuring. Delivery at 34 weeks or for maternal/fetal indications Continue routine antenatal care.   Hermina Staggers 01/09/2023,10:18 AM

## 2023-01-10 DIAGNOSIS — Z3A29 29 weeks gestation of pregnancy: Secondary | ICD-10-CM

## 2023-01-10 DIAGNOSIS — O42913 Preterm premature rupture of membranes, unspecified as to length of time between rupture and onset of labor, third trimester: Secondary | ICD-10-CM | POA: Diagnosis not present

## 2023-01-10 DIAGNOSIS — Z3A30 30 weeks gestation of pregnancy: Secondary | ICD-10-CM | POA: Diagnosis not present

## 2023-01-10 NOTE — Progress Notes (Signed)
FACULTY PRACTICE ANTEPARTUM COMPREHENSIVE PROGRESS NOTE  Rebecca Webster is a 42 y.o. [id number] at [redacted]w[redacted]d who is admitted for PPROM with McDonald cerclage in place.  Estimated Date of Delivery: 03/28/23 Fetal presentation is cephalic (6/5).  Length of Stay:  23 Days. Admitted 12/17/2022  Subjective: No complaints this AM. No longer leaking. Denies ctx, VB. Reports good fetal movement.   Vitals:  Blood pressure 132/73, pulse 97, temperature 98.2 F (36.8 C), temperature source Oral, resp. rate 18, height 5\' 10"  (1.778 m), weight 110.7 kg, last menstrual period 06/21/2022, SpO2 98 %, unknown if currently breastfeeding. Physical Examination: CONSTITUTIONAL: Well-developed, well-nourished female in no acute distress.  CARDIOVASCULAR: Normal heart rate noted RESPIRATORY: Effort normal, no problems with respiration noted ABDOMEN: Soft, nontender, nondistended, gravid. CERVIX: Dilation:  (cerclage in place)  Fetal monitoring: FHR: 150 bpm, Variability: moderate, Accelerations: Present, Decelerations: Absent  Uterine activity: Flat  No results found for this or any previous visit (from the past 48 hour(s)).  [place] MFM OB LIMITED  Result Date: 01/09/2023 ----------------------------------------------------------------------  OBSTETRICS REPORT                       (Signed Final 01/09/2023 11:02 am) ---------------------------------------------------------------------- Patient Info  ID #:       [id number]                          D.O.B.:  1981/02/23 (41 yrs)  Name:       Rebecca Webster                Visit Date: 01/09/2023 10:41 am ---------------------------------------------------------------------- Performed By  Attending:        Noralee Space MD        Secondary Phy.:   Hermina Staggers                                                             MD  Performed By:     Marcellina Millin          Address:          [address]                                                              [place], [place]                                                             [address]  Referred By:      [facility]       [facility] ---------------------------------------------------------------------- Orders  #  Description                           Code        Ordered By  1  [place] MFM OB LIMITED                     16109.60    Nettie Elm ----------------------------------------------------------------------  #  Order #                     Accession #                Episode #  1  [id number]                   [id number]                 [id number] ---------------------------------------------------------------------- Indications  Premature rupture of membranes - leaking       O42.90  fluid  [redacted] weeks gestation of pregnancy                Z3A.28  Cervical cerclage suture present, third        O34.33  trimester  Advanced maternal age multigravida 58+,        O3.523  third trimester ---------------------------------------------------------------------- Fetal Evaluation  Num Of Fetuses:         1  Fetal Heart Rate(bpm):  151  Cardiac Activity:       Observed  Presentation:           Cephalic  Placenta:               Anterior  P. Cord Insertion:      Visualized  Amniotic Fluid  AFI FV:      Within normal limits  AFI Sum(cm)     %Tile       Largest Pocket(cm)  11.9            28          4.6  RUQ(cm)       RLQ(cm)       LUQ(cm)        LLQ(cm)  2.1           1.5           4.6            3.7 ---------------------------------------------------------------------- OB History  Gravidity:    10        Term:   3        Prem:   2        SAB:   3  Ectopic:      1        Living:  6 ---------------------------------------------------------------------- Gestational Age  LMP:           28w 6d        Date:  06/21/22                 EDD:   03/28/23  Best:          Eden Emms 6d     Det. By:  LMP  (06/21/22)          EDD:   03/28/23  ---------------------------------------------------------------------- Anatomy  Cranium:               Appears normal         Stomach:  Appears normal, left                                                                        sided  Ventricles:            Appears normal         Bladder:                Appears normal  Diaphragm:             Appears normal ---------------------------------------------------------------------- Cervix Uterus Adnexa  Cervix  Not visualized (advanced GA >24wks)  Uterus  No abnormality visualized.  Right Ovary  Not visualized. No adnexal mass visualized.  Left Ovary  Within normal limits.  Adnexa  No abnormality visualized ---------------------------------------------------------------------- Impression  Patient is admitted with diagnosis of P PROM.  On ultrasound  performed last week, oligohydramnios was seen.  On today's ultrasound, amniotic fluid is normal and good fetal  activity seen.  Cephalic presentation. ---------------------------------------------------------------------- Recommendations  - Amniotic fluid assessment next week. ----------------------------------------------------------------------                 Noralee Space, MD Electronically Signed Final Report   01/09/2023 11:02 am ----------------------------------------------------------------------   Current scheduled medications  aspirin  162 mg Oral Daily   docusate sodium  100 mg Oral Daily   ferrous sulfate  325 mg Oral Q72H   prenatal multivitamin  1 tablet Oral Q1200   sodium chloride flush  3 mL Intravenous Q12H    I have reviewed the patient's current medications.  ASSESSMENT: Principal Problem:   Preterm premature rupture of membranes, antepartum Active Problems:   Supervision of high-risk pregnancy   History of vaginal delivery following previous cesarean delivery   Cervical insufficiency during pregnancy in second trimester, antepartum   Cervical cerclage suture  present   PLAN: PPROM, cerclage in place - s/p latency abx - no e/o PTL, IAI or abruption at this time - continue expectant management until 34 weeks or sooner prn   FWB - dNSTs, weekly [place] for AFI w/ weekly BPP to start at 31 weeks - S/p BMZ 5/14-15 - Mag eligible - Growth 5/14 @ 25/5 874g (50%) - will obtain updated growth next week - S/p NICU consult    Routine, hx CS - Will discuss repeat GDM screening, tdap - TOLAC consent previously signed (5/16)  4. Anemia - most recent Hgb 9.5 on 9/4 - po iron started 5/27   Dispo: Continue routine antenatal care. Anticipate delivery by 34 weeks.   Harvie Bridge, MD Obstetrician & Gynecologist, [facility] for [employer], [facility]

## 2023-01-11 DIAGNOSIS — O42913 Preterm premature rupture of membranes, unspecified as to length of time between rupture and onset of labor, third trimester: Secondary | ICD-10-CM | POA: Diagnosis not present

## 2023-01-11 DIAGNOSIS — Z3A29 29 weeks gestation of pregnancy: Secondary | ICD-10-CM | POA: Diagnosis not present

## 2023-01-11 DIAGNOSIS — Z3A30 30 weeks gestation of pregnancy: Secondary | ICD-10-CM | POA: Diagnosis not present

## 2023-01-11 LAB — CBC
HCT: 30.9 % — ABNORMAL LOW (ref 36.0–46.0)
Hemoglobin: 9.7 g/dL — ABNORMAL LOW (ref 12.0–15.0)
MCH: 23.2 pg — ABNORMAL LOW (ref 26.0–34.0)
MCHC: 31.4 g/dL (ref 30.0–36.0)
MCV: 73.9 fL — ABNORMAL LOW (ref 80.0–100.0)
Platelets: 199 10*3/uL (ref 150–400)
RBC: 4.18 MIL/uL (ref 3.87–5.11)
RDW: 16.9 % — ABNORMAL HIGH (ref 11.5–15.5)
WBC: 8.6 10*3/uL (ref 4.0–10.5)
nRBC: 0 % (ref 0.0–0.2)

## 2023-01-11 LAB — TYPE AND SCREEN
ABO/RH(D): O POS
Antibody Screen: NEGATIVE

## 2023-01-11 MED ORDER — SODIUM CHLORIDE 0.9 % IV SOLN
500.0000 mg | Freq: Once | INTRAVENOUS | Status: AC
  Administered 2023-01-11: 500 mg via INTRAVENOUS
  Filled 2023-01-11: qty 15

## 2023-01-11 NOTE — Progress Notes (Signed)
FACULTY PRACTICE ANTEPARTUM COMPREHENSIVE PROGRESS NOTE  ALLEYA DEMETER is a 42 y.o. [id number] at [redacted]w[redacted]d who is admitted for PPROM with McDonald cerclage in place.  Estimated Date of Delivery: 03/28/23 Fetal presentation is cephalic (6/5).  Length of Stay:  24 Days. Admitted 12/17/2022  Subjective: Reports epigastric pain that seems to be associated with PO iron. Otherwise no complaints. No longer leaking. Denies ctx, VB. Reports good fetal movement.   Vitals:  Blood pressure (!) 140/74, pulse 90, temperature 98.8 F (37.1 C), temperature source Oral, resp. rate 18, height 5\' 10"  (1.778 m), weight 110.7 kg, last menstrual period 06/21/2022, SpO2 98 %, unknown if currently breastfeeding. Physical Examination: CONSTITUTIONAL: Well-developed, well-nourished female in no acute distress.  CARDIOVASCULAR: Normal heart rate noted RESPIRATORY: Effort normal, no problems with respiration noted ABDOMEN: Soft, nontender, nondistended, gravid. CERVIX: Dilation:  (cerclage in place)  Fetal monitoring: FHR: 150 bpm, Variability: moderate, Accelerations: Present, Decelerations: Absent  Uterine activity: Flat  Results for orders placed or performed during the hospital encounter of 12/17/22 (from the past 48 hour(s))  Type and screen [facility]     Status: None   Collection Time: 01/11/23  4:31 AM  Result Value Ref Range   ABO/RH(D) O POS    Antibody Screen NEG    Sample Expiration      01/14/2023,2359 Performed at [facility], [address] [place], [place] [address]   CBC     Status: Abnormal   Collection Time: 01/11/23  4:34 AM  Result Value Ref Range   WBC 8.6 4.0 - 10.5 K/uL   RBC 4.18 3.87 - 5.11 MIL/uL   Hemoglobin 9.7 (L) 12.0 - 15.0 g/dL   HCT 08.6 (L) 57.8 - 46.9 %   MCV 73.9 (L) 80.0 - 100.0 fL   MCH 23.2 (L) 26.0 - 34.0 pg   MCHC 31.4 30.0 - 36.0 g/dL   RDW 62.9 (H) 52.8 - 41.3 %   Platelets 199 150 - 400 K/uL   nRBC 0.0 0.0 - 0.2 %    Comment:  Performed at [facility], [address], [place], [place] [address]   No results found.  Current scheduled medications  aspirin  162 mg Oral Daily   docusate sodium  100 mg Oral Daily   prenatal multivitamin  1 tablet Oral Q1200   sodium chloride flush  3 mL Intravenous Q12H   I have reviewed the patient's current medications.  ASSESSMENT: Principal Problem:   Preterm premature rupture of membranes, antepartum Active Problems:   Supervision of high-risk pregnancy   History of vaginal delivery following previous cesarean delivery   Cervical insufficiency during pregnancy in second trimester, antepartum   Cervical cerclage suture present   PLAN: PPROM, cerclage in place - s/p latency abx - no e/o PTL, IAI or abruption at this time - continue expectant management until 34 weeks or sooner prn   FWB - dNSTs, weekly [place] for AFI w/ weekly BPP to start at 31 weeks - S/p BMZ 5/14-15 - Mag eligible - Growth 5/14 @ 25/5 874g (50%) - will obtain updated growth next week - S/p NICU consult    3. Possible gestational hypertension - BP 149/79 on 6/6 1951 & 140/74 on 6/7 1510 - will obtain preE labs, monitor BP closely  5. Anemia - most recent Hgb 9.7 on 6/7 - po iron started 5/27 -- given difficulty tolerating PO and limited response to Hgb, will give IV iron x 1 today  5. Routine, hx CS - Will discuss repeat GDM screening, tdap - TOLAC consent previously signed (5/16)   Dispo: Continue routine antenatal care. Anticipate delivery by 34 weeks.   Harvie Bridge, MD Obstetrician & Gynecologist, [facility] for [employer], [facility]

## 2023-01-12 DIAGNOSIS — O42913 Preterm premature rupture of membranes, unspecified as to length of time between rupture and onset of labor, third trimester: Secondary | ICD-10-CM | POA: Diagnosis not present

## 2023-01-12 DIAGNOSIS — Z3A29 29 weeks gestation of pregnancy: Secondary | ICD-10-CM | POA: Diagnosis not present

## 2023-01-12 LAB — CBC
HCT: 31.1 % — ABNORMAL LOW (ref 36.0–46.0)
Hemoglobin: 9.6 g/dL — ABNORMAL LOW (ref 12.0–15.0)
MCH: 22.8 pg — ABNORMAL LOW (ref 26.0–34.0)
MCHC: 30.9 g/dL (ref 30.0–36.0)
MCV: 73.9 fL — ABNORMAL LOW (ref 80.0–100.0)
Platelets: 212 10*3/uL (ref 150–400)
RBC: 4.21 MIL/uL (ref 3.87–5.11)
RDW: 17.2 % — ABNORMAL HIGH (ref 11.5–15.5)
WBC: 8.3 10*3/uL (ref 4.0–10.5)
nRBC: 0.2 % (ref 0.0–0.2)

## 2023-01-12 LAB — COMPREHENSIVE METABOLIC PANEL
ALT: 17 U/L (ref 0–44)
AST: 16 U/L (ref 15–41)
Albumin: 2.4 g/dL — ABNORMAL LOW (ref 3.5–5.0)
Alkaline Phosphatase: 62 U/L (ref 38–126)
Anion gap: 11 (ref 5–15)
BUN: 6 mg/dL (ref 6–20)
CO2: 21 mmol/L — ABNORMAL LOW (ref 22–32)
Calcium: 9 mg/dL (ref 8.9–10.3)
Chloride: 106 mmol/L (ref 98–111)
Creatinine, Ser: 0.54 mg/dL (ref 0.44–1.00)
GFR, Estimated: >60 mL/min (ref >60)
Glucose, Bld: 96 mg/dL (ref 70–99)
Potassium: 3.5 mmol/L (ref 3.5–5.1)
Sodium: 138 mmol/L (ref 135–145)
Total Bilirubin: 0.4 mg/dL (ref 0.3–1.2)
Total Protein: 5.3 g/dL — ABNORMAL LOW (ref 6.5–8.1)

## 2023-01-12 NOTE — Plan of Care (Signed)
  Problem: Clinical Measurements: Goal: Complications related to the disease process, condition or treatment will be avoided or minimized Outcome: Progressing   Problem: Health Behavior/Discharge Planning: Goal: Ability to manage health-related needs will improve Outcome: Progressing   Problem: Clinical Measurements: Goal: Ability to maintain clinical measurements within normal limits will improve Outcome: Progressing Goal: Will remain free from infection Outcome: Progressing Goal: Diagnostic test results will improve Outcome: Progressing   Problem: Pain Managment: Goal: General experience of comfort will improve Outcome: Progressing   

## 2023-01-12 NOTE — Progress Notes (Signed)
FACULTY PRACTICE ANTEPARTUM PROGRESS NOTE  Rebecca Webster is a 42 y.o. [id number] at [redacted]w[redacted]d who is admitted for PPROM with McDonald cerclage in place..  Estimated Date of Delivery: 03/28/23 Fetal presentation is cephalic.  Length of Stay:  25 Days. Admitted 12/17/2022  Subjective: Pt seen and is doing well. Patient reports normal fetal movement.  She denies uterine contractions, denies bleeding and leaking of fluid per vagina.  Vitals:  Blood pressure 135/84, pulse 93, temperature 98.2 F (36.8 C), temperature source Oral, resp. rate 16, height 5\' 10"  (1.778 m), weight 110.7 kg, last menstrual period 06/21/2022, SpO2 100 %, unknown if currently breastfeeding. Physical Examination: CONSTITUTIONAL: Well-developed, well-nourished female in no acute distress.  HENT:  Normocephalic, atraumatic, External right and left ear normal. Oropharynx is clear and moist EYES: Conjunctivae and EOM are normal.  NECK: Normal range of motion, supple, no masses. SKIN: Skin is warm and dry. No rash noted. Not diaphoretic. No erythema. No pallor. NEUROLGIC: Alert and oriented to person, place, and time. Normal reflexes, muscle tone coordination. No cranial nerve deficit noted. PSYCHIATRIC: Normal mood and affect. Normal behavior. Normal judgment and thought content. CARDIOVASCULAR: Normal heart rate and regular rhythm RESPIRATORY: Effort and breath sounds normal, no problems with respiration noted MUSCULOSKELETAL: Normal range of motion. No edema and no tenderness. ABDOMEN: Soft, nontender, nondistended, gravid. CERVIX: deferred  Fetal monitoring: FHR: 145 bpm, Variability: moderate, Accelerations: Present, Decelerations: Absent  Uterine activity: none  Results for orders placed or performed during the hospital encounter of 12/17/22 (from the past 48 hour(s))  Type and screen [facility]     Status: None   Collection Time: 01/11/23  4:31 AM  Result Value Ref Range   ABO/RH(D) O POS     Antibody Screen NEG    Sample Expiration      01/14/2023,2359 Performed at [facility], [address] [place], [place] [address]   CBC     Status: Abnormal   Collection Time: 01/11/23  4:34 AM  Result Value Ref Range   WBC 8.6 4.0 - 10.5 K/uL   RBC 4.18 3.87 - 5.11 MIL/uL   Hemoglobin 9.7 (L) 12.0 - 15.0 g/dL   HCT 11.9 (L) 14.7 - 82.9 %   MCV 73.9 (L) 80.0 - 100.0 fL   MCH 23.2 (L) 26.0 - 34.0 pg   MCHC 31.4 30.0 - 36.0 g/dL   RDW 56.2 (H) 13.0 - 86.5 %   Platelets 199 150 - 400 K/uL   nRBC 0.0 0.0 - 0.2 %    Comment: Performed at [facility], [address], [place], [place] [address]  CBC     Status: Abnormal   Collection Time: 01/12/23  4:18 AM  Result Value Ref Range   WBC 8.3 4.0 - 10.5 K/uL   RBC 4.21 3.87 - 5.11 MIL/uL   Hemoglobin 9.6 (L) 12.0 - 15.0 g/dL   HCT 62.9 (L) 52.8 - 41.3 %   MCV 73.9 (L) 80.0 - 100.0 fL   MCH 22.8 (L) 26.0 - 34.0 pg   MCHC 30.9 30.0 - 36.0 g/dL   RDW 24.4 (H) 01.0 - 27.2 %   Platelets 212 150 - 400 K/uL   nRBC 0.2 0.0 - 0.2 %    Comment: Performed at [facility], [address], [place], [place] [address]  Comprehensive metabolic panel     Status: Abnormal   Collection Time: 01/12/23  4:18 AM  Result Value Ref Range  Sodium 138 135 - 145 mmol/L   Potassium 3.5 3.5 - 5.1 mmol/L   Chloride 106 98 - 111 mmol/L   CO2 21 (L) 22 - 32 mmol/L   Glucose, Bld 96 70 - 99 mg/dL    Comment: Glucose reference range applies only to samples taken after fasting for at least 8 hours.   BUN 6 6 - 20 mg/dL   Creatinine, Ser 1.61 0.44 - 1.00 mg/dL   Calcium 9.0 8.9 - 09.6 mg/dL   Total Protein 5.3 (L) 6.5 - 8.1 g/dL   Albumin 2.4 (L) 3.5 - 5.0 g/dL   AST 16 15 - 41 U/L   ALT 17 0 - 44 U/L   Alkaline Phosphatase 62 38 - 126 U/L   Total Bilirubin 0.4 0.3 - 1.2 mg/dL   GFR, Estimated >04 >54 mL/min    Comment: (NOTE) Calculated using the CKD-EPI Creatinine Equation (2021)    Anion gap 11 5 - 15    Comment: Performed at [facility], [address] [place], [place] [address]    I have reviewed the patient's current medications.  ASSESSMENT: Principal Problem:   Preterm premature rupture of membranes, antepartum Active Problems:   Supervision of high-risk pregnancy   History of vaginal delivery following previous cesarean delivery   Cervical insufficiency during pregnancy in second trimester, antepartum   Cervical cerclage suture present   PLAN:  PPROM, cerclage in place Continue inpt hosptial observation until 34 weeks 2.  Daily NST and weekly BPP starting at 31 weeks. 3.  Blood pressure currently high normal. 4.Pt had previous c section, desires TOLAC at time of delivery.   Continue routine antenatal care.   Mariel Aloe, MD [facility] Faculty Attending, Center for [facility] 01/12/2023 8:54 AM

## 2023-01-13 DIAGNOSIS — O42913 Preterm premature rupture of membranes, unspecified as to length of time between rupture and onset of labor, third trimester: Secondary | ICD-10-CM | POA: Diagnosis not present

## 2023-01-13 DIAGNOSIS — Z3A29 29 weeks gestation of pregnancy: Secondary | ICD-10-CM | POA: Diagnosis not present

## 2023-01-13 DIAGNOSIS — Z3A30 30 weeks gestation of pregnancy: Secondary | ICD-10-CM | POA: Diagnosis not present

## 2023-01-13 NOTE — Plan of Care (Signed)
  Problem: Clinical Measurements: Goal: Complications related to the disease process, condition or treatment will be avoided or minimized Outcome: Progressing   Problem: Health Behavior/Discharge Planning: Goal: Ability to manage health-related needs will improve Outcome: Progressing   Problem: Clinical Measurements: Goal: Ability to maintain clinical measurements within normal limits will improve Outcome: Progressing Goal: Will remain free from infection Outcome: Progressing Goal: Diagnostic test results will improve Outcome: Progressing   Problem: Pain Managment: Goal: General experience of comfort will improve Outcome: Progressing   

## 2023-01-13 NOTE — Progress Notes (Signed)
FACULTY PRACTICE ANTEPARTUM COMPREHENSIVE PROGRESS NOTE  Rebecca Webster is a 42 y.o. [id number] at [redacted]w[redacted]d who is admitted for PPROM with McDonald cerclage in place.  Estimated Date of Delivery: 03/28/23 Fetal presentation is cephalic (6/5).  Length of Stay:  26 Days. Admitted 12/17/2022  Subjective: Doing well this morning. Denies ctx, VB, LOF. Reports good fetal movement.  Denies HA, visual changes, CP/SOB, RUQ/epigastric pain Considering tdap   Vitals:  Blood pressure 126/72, pulse 91, temperature 98.2 F (36.8 C), temperature source Oral, resp. rate 18, height 5\' 10"  (1.778 m), weight 110.7 kg, last menstrual period 06/21/2022, SpO2 100 %, unknown if currently breastfeeding. Physical Examination: CONSTITUTIONAL: Well-developed, well-nourished female in no acute distress.  CARDIOVASCULAR: Normal heart rate noted RESPIRATORY: Effort normal, no problems with respiration noted ABDOMEN: Soft, nontender, nondistended, gravid. CERVIX: Dilation:  (cerclage in place)  Fetal monitoring: FHR: 140 bpm, Variability: moderate, Accelerations: Present (10x10), Decelerations: Absent  Uterine activity: Flat  Results for orders placed or performed during the hospital encounter of 12/17/22 (from the past 48 hour(s))  CBC     Status: Abnormal   Collection Time: 01/12/23  4:18 AM  Result Value Ref Range   WBC 8.3 4.0 - 10.5 K/uL   RBC 4.21 3.87 - 5.11 MIL/uL   Hemoglobin 9.6 (L) 12.0 - 15.0 g/dL   HCT 47.8 (L) 29.5 - 62.1 %   MCV 73.9 (L) 80.0 - 100.0 fL   MCH 22.8 (L) 26.0 - 34.0 pg   MCHC 30.9 30.0 - 36.0 g/dL   RDW 30.8 (H) 65.7 - 84.6 %   Platelets 212 150 - 400 K/uL   nRBC 0.2 0.0 - 0.2 %    Comment: Performed at [facility], [address], [place], [place] [address]  Comprehensive metabolic panel     Status: Abnormal   Collection Time: 01/12/23  4:18 AM  Result Value Ref Range   Sodium 138 135 - 145 mmol/L   Potassium 3.5 3.5 - 5.1 mmol/L   Chloride 106 98 - 111 mmol/L   CO2 21  (L) 22 - 32 mmol/L   Glucose, Bld 96 70 - 99 mg/dL    Comment: Glucose reference range applies only to samples taken after fasting for at least 8 hours.   BUN 6 6 - 20 mg/dL   Creatinine, Ser 2.84 0.44 - 1.00 mg/dL   Calcium 9.0 8.9 - 13.2 mg/dL   Total Protein 5.3 (L) 6.5 - 8.1 g/dL   Albumin 2.4 (L) 3.5 - 5.0 g/dL   AST 16 15 - 41 U/L   ALT 17 0 - 44 U/L   Alkaline Phosphatase 62 38 - 126 U/L   Total Bilirubin 0.4 0.3 - 1.2 mg/dL   GFR, Estimated >44 >01 mL/min    Comment: (NOTE) Calculated using the CKD-EPI Creatinine Equation (2021)    Anion gap 11 5 - 15    Comment: Performed at [facility], [address], [place], [place] [address]   No results found.  Current scheduled medications  aspirin  162 mg Oral Daily   docusate sodium  100 mg Oral Daily   prenatal multivitamin  1 tablet Oral Q1200   sodium chloride flush  3 mL Intravenous Q12H   I have reviewed the patient's current medications.  ASSESSMENT: Principal Problem:   Preterm premature rupture of membranes, antepartum Active Problems:   Supervision of high-risk pregnancy   History of vaginal delivery following previous cesarean delivery   Cervical insufficiency during pregnancy  in second trimester, antepartum   Cervical cerclage suture present   PLAN: PPROM, cerclage in place - s/p latency abx - no e/o PTL, IAI or abruption at this time - continue expectant management until 34 weeks or sooner prn   FWB - dNSTs, weekly [place] for AFI w/ weekly BPP to start at 31 weeks - S/p BMZ 5/14-15 - Mag eligible - Growth 5/14 @ 25/5 874g (50%) - next growth ordered for 6/12 - S/p NICU consult    3. Gestational hypertension - Met criteria 6/9 for mild range BP > 4h apart, but largely normotensive - Serum preE labs 6/8 normal, trending weekly  5. Anemia - most recent Hgb 9.6 on 6/8 - s/p IV iron 6/7  5. Routine, hx CS - Discussed repeat GDM screening, pt agreeable to 1h tomorrow AM - Considering tdap -  TOLAC consent previously signed (5/16)   Dispo: Continue routine antenatal care. Anticipate delivery by 34 weeks.   Harvie Bridge, MD Obstetrician & Gynecologist, [facility] for [employer], [facility]

## 2023-01-14 DIAGNOSIS — O3433 Maternal care for cervical incompetence, third trimester: Secondary | ICD-10-CM | POA: Diagnosis not present

## 2023-01-14 DIAGNOSIS — O42913 Preterm premature rupture of membranes, unspecified as to length of time between rupture and onset of labor, third trimester: Secondary | ICD-10-CM | POA: Diagnosis not present

## 2023-01-14 DIAGNOSIS — Z3A29 29 weeks gestation of pregnancy: Secondary | ICD-10-CM

## 2023-01-14 LAB — GLUCOSE, 1 HOUR GESTATIONAL: Glucose Tolerance, 1 hour: 133 mg/dL

## 2023-01-14 LAB — TYPE AND SCREEN
ABO/RH(D): O POS
Antibody Screen: NEGATIVE

## 2023-01-14 LAB — GLUCOSE, RANDOM: Glucose, Bld: 86 mg/dL (ref 70–99)

## 2023-01-14 NOTE — Progress Notes (Signed)
Patient ID: Rebecca Webster, female   DOB: 1981/04/23, 42 y.o.   MRN: [id number] FACULTY PRACTICE ANTEPARTUM COMPREHENSIVE PROGRESS NOTE  Rebecca Webster is a 42 y.o. [id number] at [redacted]w[redacted]d who is admitted for PPROM with McDonald cerclage in place.  Estimated Date of Delivery: 03/28/23 Fetal presentation is cephalic (6/5).  Length of Stay:  27 Days. Admitted 12/17/2022  Subjective: Doing well this morning. Denies ctx, VB, LOF. Reports good fetal movement.  Denies HA, visual changes, CP/SOB, RUQ/epigastric pain   Vitals:  Blood pressure 129/70, pulse 90, temperature 98.2 F (36.8 C), temperature source Oral, resp. rate 18, height 5\' 10"  (1.778 m), weight 110.7 kg, last menstrual period 06/21/2022, SpO2 100 %, unknown if currently breastfeeding. Physical Examination: CONSTITUTIONAL: Well-developed, well-nourished female in no acute distress.  CARDIOVASCULAR: Normal heart rate noted RESPIRATORY: Effort normal, no problems with respiration noted ABDOMEN: Soft, nontender, nondistended, gravid. CERVIX: not performed  Fetal monitoring: FHR: 140 bpm, Variability: moderate, Accelerations: Present (10x10), Decelerations: Absent  Uterine activity: no contractions  Results for orders placed or performed during the hospital encounter of 12/17/22 (from the past 48 hour(s))  [facility]     Status: None   Collection Time: 01/14/23  5:15 AM  Result Value Ref Range   ABO/RH(D) O POS    Antibody Screen NEG    Sample Expiration      01/17/2023,2359 Performed at [facility], [address], [place], [place] [address]   Glucose, random     Status: None   Collection Time: 01/14/23  5:24 AM  Result Value Ref Range   Glucose, Bld 86 70 - 99 mg/dL    Comment: Glucose reference range applies only to samples taken after fasting for at least 8 hours. Performed at [facility], [address], [place], [place] [address]   Glucose, 1 hour gestational     Status:  None   Collection Time: 01/14/23  7:01 AM  Result Value Ref Range   Glucose Tolerance, 1 hour 133 mg/dL    Comment: Performed at [facility], [address] [place], [place] [address]   No results found.  Current scheduled medications  aspirin  162 mg Oral Daily   docusate sodium  100 mg Oral Daily   prenatal multivitamin  1 tablet Oral Q1200   sodium chloride flush  3 mL Intravenous Q12H   I have reviewed the patient's current medications.  ASSESSMENT: Principal Problem:   Preterm premature rupture of membranes, antepartum Active Problems:   Supervision of high-risk pregnancy   History of vaginal delivery following previous cesarean delivery   Cervical insufficiency during pregnancy in second trimester, antepartum   Cervical cerclage suture present   PLAN: PPROM, cerclage in place - s/p latency abx - no e/o PTL, IAI or abruption at this time - continue expectant management until 34 weeks or sooner prn   FWB - dNSTs, weekly [place] for AFI w/ weekly BPP to start at 31 weeks - S/p BMZ 5/14-15 - Mag eligible - Growth 5/14 @ 25/5 874g (50%) - next growth ordered for 6/12 - S/p NICU consult    3. Gestational hypertension - Met criteria 6/9 for mild range BP > 4h apart, but largely normotensive - Serum preE labs 6/8 normal, trending weekly  5. Anemia - most recent Hgb 9.6 on 6/8 - s/p IV iron 6/7  5. Routine, hx CS - Normal Glucola this morning - TOLAC consent previously signed (5/16)  Dispo: Continue routine antenatal care. Anticipate delivery by 34 weeks or sooner pending si/sx of chorioamnionitis.   Catalina Antigua, MD Obstetrician & Gynecologist, [facility] for [employer], [facility]

## 2023-01-14 NOTE — Progress Notes (Signed)
Chaplain asked open ended questions to facilitate story telling and emotional expression in the setting of a lengthy [facility] stay. Rebecca [facility] is expecting to stay at the [facility] for another 4 weeks until she is around 34 weeks. She is most missing her daughter, Rebecca Webster who is 42 years old, but is grateful to her husband who brings her to visit every day and has found ways to keep Rebecca Webster engaged in their home life through regular facetime calls. Rebecca Webster has several older children in their 11s but is eager to have a baby boy again and experience this new dynamic with Rebecca [facility]. She reports the hardest part about being in the [facility] is the expanse of time without a lot to fill it up. Chaplain normalized conflicting feelings of being ready to leave but also gratitude for every day that the baby has to grow in utero. Rebecca Webster is open to continued support as she awaits Rebecca [facility]'s birth.  Please page as further needs arise.  Rebecca Webster. Rebecca [facility], M.Div. [facility] Chaplain Pager [phone] Office [phone]

## 2023-01-15 DIAGNOSIS — O42913 Preterm premature rupture of membranes, unspecified as to length of time between rupture and onset of labor, third trimester: Secondary | ICD-10-CM | POA: Diagnosis not present

## 2023-01-15 DIAGNOSIS — Z3A29 29 weeks gestation of pregnancy: Secondary | ICD-10-CM | POA: Diagnosis not present

## 2023-01-15 MED ORDER — VITAFOL-OB+DHA 65-1 & 250 MG PO MISC
1.0000 | Freq: Every day | ORAL | Status: DC
  Administered 2023-01-15 – 2023-01-23 (×9): 1 via ORAL

## 2023-01-15 NOTE — Progress Notes (Signed)
FACULTY PRACTICE ANTEPARTUM PROGRESS NOTE  SHAQUASIA CAPONIGRO is a 42 y.o. [id number] at [redacted]w[redacted]d who is admitted for PPROM.  Estimated Date of Delivery: 03/28/23 Fetal presentation is cephalic.  Length of Stay:  28 Days. Admitted 12/17/2022  Subjective: Pt is resting quietly.  She denies fever/chills and there is no abdominal pain. Patient reports normal fetal movement.  She denies uterine contractions, denies bleeding and leaking of fluid per vagina.  Vitals:  Blood pressure 129/74, pulse 91, temperature 98.2 F (36.8 C), temperature source Oral, resp. rate 18, height 5\' 10"  (1.778 m), weight 110.7 kg, last menstrual period 06/21/2022, SpO2 99 %, unknown if currently breastfeeding. Physical Examination: CONSTITUTIONAL: Well-developed, well-nourished female in no acute distress.  HENT:  Normocephalic, atraumatic, External right and left ear normal. Oropharynx is clear and moist EYES: Conjunctivae and EOM are normal.  NECK: Normal range of motion, supple, no masses. SKIN: Skin is warm and dry. No rash noted. Not diaphoretic. No erythema. No pallor. NEUROLGIC: Alert and oriented to person, place, and time. Normal reflexes, muscle tone coordination. No cranial nerve deficit noted. PSYCHIATRIC: Normal mood and affect. Normal behavior. Normal judgment and thought content. CARDIOVASCULAR: Normal heart rate noted, regular rhythm RESPIRATORY: Effort and breath sounds normal, no problems with respiration noted MUSCULOSKELETAL: Normal range of motion. No edema and no tenderness. ABDOMEN: Soft, nontender, nondistended, gravid. CERVIX: deferred  Fetal monitoring: FHR: 140s bpm, Variability: moderate, Accelerations: Present, Decelerations: Absent  Uterine activity: none  Results for orders placed or performed during the hospital encounter of 12/17/22 (from the past 48 hour(s))  Type and screen [facility]     Status: None   Collection Time: 01/14/23  5:15 AM  Result Value Ref Range    ABO/RH(D) O POS    Antibody Screen NEG    Sample Expiration      01/17/2023,2359 Performed at [facility], [address], [place], [place] [address]   Glucose, random     Status: None   Collection Time: 01/14/23  5:24 AM  Result Value Ref Range   Glucose, Bld 86 70 - 99 mg/dL    Comment: Glucose reference range applies only to samples taken after fasting for at least 8 hours. Performed at [facility], [address], [place], [place] [address]   Glucose, 1 hour gestational     Status: None   Collection Time: 01/14/23  7:01 AM  Result Value Ref Range   Glucose Tolerance, 1 hour 133 mg/dL    Comment: Performed at [facility], [address], [place], [place] [address]    I have reviewed the patient's current medications.  ASSESSMENT: Principal Problem:   Preterm premature rupture of membranes, antepartum Active Problems:   Supervision of high-risk pregnancy   History of vaginal delivery following previous cesarean delivery   Cervical insufficiency during pregnancy in second trimester, antepartum   Cervical cerclage suture present   PLAN: PPROM w/ cerclage in place       S/p latency abx, no s/sx of chorioamnionitis or preterm labor       Deliver at 34 weeks 2. FWB: daily NST, weekly BPP at 31 weeks with AFI              S/p BMZ, may receive rescue steroids if needed              Growth scan on 6/12 3. Gestational hypertension.  Intermittent mild range pressures noted  4.  Prenatal care         Pt passed GTT, she desires TOLAC, consent signed   Continue routine antenatal care if no signs of chorioamnionitis. Delivery at 34 weeks or if worsening maternal/fetal status.   Mariel Aloe, MD [facility] Faculty Attending, Center for [facility] 01/15/2023 9:09 AM

## 2023-01-15 NOTE — Plan of Care (Signed)
  Problem: Clinical Measurements: Goal: Complications related to the disease process, condition or treatment will be avoided or minimized Outcome: Progressing   Problem: Health Behavior/Discharge Planning: Goal: Ability to manage health-related needs will improve Outcome: Progressing   Problem: Clinical Measurements: Goal: Ability to maintain clinical measurements within normal limits will improve Outcome: Progressing Goal: Will remain free from infection Outcome: Progressing Goal: Diagnostic test results will improve Outcome: Progressing   Problem: Pain Managment: Goal: General experience of comfort will improve Outcome: Progressing

## 2023-01-16 ENCOUNTER — Inpatient Hospital Stay (HOSPITAL_BASED_OUTPATIENT_CLINIC_OR_DEPARTMENT_OTHER): Payer: Medicaid Other

## 2023-01-16 DIAGNOSIS — Z3A29 29 weeks gestation of pregnancy: Secondary | ICD-10-CM

## 2023-01-16 DIAGNOSIS — O3433 Maternal care for cervical incompetence, third trimester: Secondary | ICD-10-CM

## 2023-01-16 DIAGNOSIS — O42913 Preterm premature rupture of membranes, unspecified as to length of time between rupture and onset of labor, third trimester: Secondary | ICD-10-CM

## 2023-01-16 DIAGNOSIS — O09523 Supervision of elderly multigravida, third trimester: Secondary | ICD-10-CM | POA: Diagnosis not present

## 2023-01-16 NOTE — Progress Notes (Signed)
Patient ID: Rebecca [facility], female   DOB: 1980-08-26, 42 y.o.   MRN: [id number] FACULTY PRACTICE ANTEPARTUM PROGRESS NOTE  Rebecca [facility] is a 42 y.o. [id number] at [redacted]w[redacted]d who is admitted for PPROM.  Estimated Date of Delivery: 03/28/23 Fetal presentation is cephalic.  Length of Stay:  29 Days. Admitted 12/17/2022  Subjective: Patient returned from ultrasound and is doing well without complaints Patient reports normal fetal movement.  She denies uterine contractions, denies bleeding and leaking of fluid per vagina.  Vitals:  Blood pressure 121/70, pulse 87, temperature 98.4 F (36.9 C), temperature source Oral, resp. rate 18, height 5\' 10"  (1.778 m), weight 110.7 kg, last menstrual period 06/21/2022, SpO2 100 %, unknown if currently breastfeeding. Physical Examination: CONSTITUTIONAL: Well-developed, well-nourished female in no acute distress.  NEUROLOGIC: Alert and oriented to person, place, and time. Normal reflexes, muscle tone coordination. No cranial nerve deficit noted. CARDIOVASCULAR: Normal heart rate noted, regular rhythm RESPIRATORY: Effort and breath sounds normal, no problems with respiration noted MUSCULOSKELETAL: Normal range of motion. No edema and no tenderness. ABDOMEN: Soft, nontender, gravid. CERVIX: not indicated  Fetal monitoring: FHR: 150s bpm, Variability: moderate, Accelerations: Present, Decelerations: Absent  Uterine activity: none  No results found for this or any previous visit (from the past 48 hour(s)).   I have reviewed the patient's current medications.  ASSESSMENT: Principal Problem:   Preterm premature rupture of membranes, antepartum Active Problems:   Supervision of high-risk pregnancy   History of vaginal delivery following previous cesarean delivery   Cervical insufficiency during pregnancy in second trimester, antepartum   Cervical cerclage suture present   PLAN: PPROM w/ cerclage in place       S/p latency abx, no s/sx of chorioamnionitis  or preterm labor       Deliver at 34 weeks  2. FWB: daily NST, weekly BPP at 31 weeks with AFI              S/p BMZ, may receive rescue steroids if needed              Follow up growth scan report  3. Gestational hypertension.                     Intermittent mild range pressures noted  4.  Prenatal care         Pt passed GTT, she desires TOLAC, consent signed   Continue routine antenatal care if no signs of chorioamnionitis. Delivery at 34 weeks or if worsening maternal/fetal status.   Catalina Antigua, MD [facility] Faculty Attending, Center for [facility] 01/16/2023 9:35 AM

## 2023-01-17 DIAGNOSIS — Z3A30 30 weeks gestation of pregnancy: Secondary | ICD-10-CM | POA: Diagnosis not present

## 2023-01-17 DIAGNOSIS — O42913 Preterm premature rupture of membranes, unspecified as to length of time between rupture and onset of labor, third trimester: Secondary | ICD-10-CM | POA: Diagnosis not present

## 2023-01-17 LAB — TYPE AND SCREEN
ABO/RH(D): O POS
Antibody Screen: NEGATIVE

## 2023-01-17 NOTE — Progress Notes (Signed)
FACULTY PRACTICE ANTEPARTUM(COMPREHENSIVE) NOTE  Rebecca [facility] is a 42 y.o. [id number] with Estimated Date of Delivery: 03/28/23   By  LMP [redacted]w[redacted]d  who is admitted for PPROM.    Fetal presentation is cephalic. Length of Stay:  30  Days  Date of admission:12/17/2022  Subjective: Pt resting comfortably reports no acute complaints. Patient reports the fetal movement as active. Patient reports uterine contraction  activity as none. Patient reports  vaginal bleeding as none. Patient describes fluid per vagina as None.  Vitals:  Blood pressure 124/73, pulse 94, temperature 98 F (36.7 C), temperature source Oral, resp. rate 16, height 5\' 10"  (1.778 m), weight 110.7 kg, last menstrual period 06/21/2022, SpO2 100 %, unknown if currently breastfeeding. Vitals:   01/16/23 1515 01/16/23 2055 01/17/23 0507 01/17/23 0805  BP: (!) 140/80 132/73 125/73 124/73  Pulse: 99 98 86 94  Resp: 16 17 18 16   Temp: 98.2 F (36.8 C) 98.6 F (37 C) 98.3 F (36.8 C) 98 F (36.7 C)  TempSrc: Oral Oral Oral Oral  SpO2: 100% 100% 99% 100%  Weight:      Height:       Physical Examination:  General appearance - alert, well appearing, and in no distress Mental status - normal mood, behavior, speech, dress, motor activity, and thought processes Chest - CTAB Heart - normal rate and regular rhythm Abdomen - gravid, soft and non-tender Extremities - no edema, no calf tenderness bilaterally Skin - warm and dry   Fetal Monitoring:  Baseline: 150 bpm, Variability: moderate, Accelerations: present 15x15 x2, and Decelerations: Absent    reactive  Labs:  Results for orders placed or performed during the hospital encounter of 12/17/22 (from the past 24 hour(s))  Type and screen [facility]   Collection Time: 01/17/23  4:28 AM  Result Value Ref Range   ABO/RH(D) O POS    Antibody Screen NEG    Sample Expiration      01/20/2023,2359 Performed at [facility], [address],  [place], [place] [address]     Imaging Studies:    [place] MFM OB FOLLOW UP  Result Date: 01/16/2023 ----------------------------------------------------------------------  OBSTETRICS REPORT                       (Signed Final 01/16/2023 09:35 am) ---------------------------------------------------------------------- Patient Info  ID #:       [id number]                          D.O.B.:  1981/05/26 (41 yrs)  Name:       Rebecca [facility]                Visit Date: 01/16/2023 07:22 am ---------------------------------------------------------------------- Performed By  Attending:        Ma Rings MD         Secondary Phy.:   Hermina Staggers                                                             MD  Performed By:     Anabel Halon          Address:          [address]  [address]                                                             [place], [place]                                                             [address]  Referred By:      [facility]       [facility] ---------------------------------------------------------------------- Orders  #  Description                           Code        Ordered By  1  [place] MFM OB FOLLOW UP                   60454.09    Harvie Bridge ----------------------------------------------------------------------  #  Order #                     Accession #                Episode #  1  [id number]                   [id number]                 [id number] ---------------------------------------------------------------------- Indications  Premature rupture of membranes - leaking       O42.90  fluid  Cervical cerclage suture present, third        O34.33  trimester  Advanced maternal age multigravida 59+,        O76.523  third trimester  [redacted] weeks gestation of pregnancy                Z3A.29 ---------------------------------------------------------------------- Fetal Evaluation  Num Of  Fetuses:         1  Fetal Heart Rate(bpm):  155  Cardiac Activity:       Observed  Presentation:           Cephalic  Placenta:               Anterior  P. Cord Insertion:      Previously visualized  Amniotic Fluid  AFI FV:      Within normal limits  AFI Sum(cm)     %Tile       Largest Pocket(cm)  14.4            49          5.7  RUQ(cm)       RLQ(cm)       LUQ(cm)        LLQ(cm)  0  3.8           5.7            4.9 ---------------------------------------------------------------------- Biometry  BPD:        75  mm     G. Age:  30w 1d         45  %    CI:        73.89   %    70 - 86                                                          FL/HC:      20.5   %    19.2 - 21.4  HC:      277.1  mm     G. Age:  30w 2d         27  %    HC/AC:      1.09        0.99 - 1.21  AC:      255.1  mm     G. Age:  29w 5d         39  %    FL/BPD:     75.6   %    71 - 87  FL:       56.7  mm     G. Age:  29w 5d         32  %    FL/AC:      22.2   %    20 - 24  Est. FW:    1457  gm      3 lb 3 oz     35  % ---------------------------------------------------------------------- OB History  Gravidity:    10        Term:   3        Prem:   2        SAB:   3  Ectopic:      1        Living:  6 ---------------------------------------------------------------------- Gestational Age  LMP:           29w 6d        Date:  06/21/22                 EDD:   03/28/23  U/S Today:     30w 0d                                        EDD:   03/27/23  Best:          29w 6d     Det. By:  LMP  (06/21/22)          EDD:   03/28/23 ---------------------------------------------------------------------- Anatomy  Cranium:               Appears normal         LVOT:                   Previously seen  Cavum:                 Previously seen        Aortic Arch:  Not well visualized  Ventricles:            Previously seen        Ductal Arch:            Not well visualized  Choroid Plexus:        Previously seen        Diaphragm:              Appears normal   Cerebellum:            Previously seen        Stomach:                Appears normal, left                                                                        sided  Posterior Fossa:       Previously seen        Abdomen:                Previously seen  Nuchal Fold:           Not applicable (>20    Abdominal Wall:         Previously seen                         wks GA)  Face:                  Orbits and profile     Cord Vessels:           Previously seen                         previously seen  Lips:                  Previously seen        Kidneys:                Appear normal  Palate:                Previously             Bladder:                Appears normal                         visualized  Thoracic:              Previously seen        Spine:                  Previously seen  Heart:                 Not well visualized    Upper Extremities:      Previously seen  RVOT:                  Previously seen        Lower Extremities:      Previously seen ---------------------------------------------------------------------- Cervix Uterus Adnexa  Cervix  Not visualized (advanced GA >24wks)  Uterus  No abnormality visualized. ----------------------------------------------------------------------  Comments  This patient has been hospitalized due to PPROM.  The overall EFW obtained today was 3 pounds 3 ounces  (35th percentile).  There was normal amniotic fluid noted with a total AFI of 14.4  cm.  The fetus was in the vertex presentation. ----------------------------------------------------------------------                   Ma Rings, MD Electronically Signed Final Report   01/16/2023 09:35 am ----------------------------------------------------------------------     ASSESSMENT: [id number] [redacted]w[redacted]d Estimated Date of Delivery: 03/28/23  -PPROM -Cerclage in place due to h/o cervical insufficiency -Prior C-section -AMA  PLAN: 1) PPROM with cerclage in place -s/p latency antibiotic -no evidence of infection or  labor  2) FWB -continue NST daily, reactive today -plan to start BPP weekly @ 31wks -s/p BMZ -continue growth q 4wks, last completed 6/12: vertex, AFI 14, 1457g, 3#3oz (35%)  3) Maternal care -GHTN- intermittently elevated 140/80s, no meds currently -TOLAC -routine OB care  DISP: Continue inpatient monitoring.  Plan for delivery @34wks  or if change in maternal/fetal status.  Myna Hidalgo, DO Attending Obstetrician & Gynecologist, [facility] for [employer], [facility]

## 2023-01-18 DIAGNOSIS — O42913 Preterm premature rupture of membranes, unspecified as to length of time between rupture and onset of labor, third trimester: Secondary | ICD-10-CM | POA: Diagnosis not present

## 2023-01-18 DIAGNOSIS — Z3A30 30 weeks gestation of pregnancy: Secondary | ICD-10-CM

## 2023-01-18 LAB — COMPREHENSIVE METABOLIC PANEL
ALT: 25 U/L (ref 0–44)
AST: 15 U/L (ref 15–41)
Albumin: 2.5 g/dL — ABNORMAL LOW (ref 3.5–5.0)
Alkaline Phosphatase: 63 U/L (ref 38–126)
Anion gap: 9 (ref 5–15)
BUN: 7 mg/dL (ref 6–20)
CO2: 20 mmol/L — ABNORMAL LOW (ref 22–32)
Calcium: 8.7 mg/dL — ABNORMAL LOW (ref 8.9–10.3)
Chloride: 106 mmol/L (ref 98–111)
Creatinine, Ser: 0.47 mg/dL (ref 0.44–1.00)
GFR, Estimated: >60 mL/min (ref >60)
Glucose, Bld: 96 mg/dL (ref 70–99)
Potassium: 3.6 mmol/L (ref 3.5–5.1)
Sodium: 135 mmol/L (ref 135–145)
Total Bilirubin: 0.4 mg/dL (ref 0.3–1.2)
Total Protein: 5.5 g/dL — ABNORMAL LOW (ref 6.5–8.1)

## 2023-01-18 LAB — CBC
HCT: 31.7 % — ABNORMAL LOW (ref 36.0–46.0)
Hemoglobin: 9.9 g/dL — ABNORMAL LOW (ref 12.0–15.0)
MCH: 23.1 pg — ABNORMAL LOW (ref 26.0–34.0)
MCHC: 31.2 g/dL (ref 30.0–36.0)
MCV: 74.1 fL — ABNORMAL LOW (ref 80.0–100.0)
Platelets: 221 10*3/uL (ref 150–400)
RBC: 4.28 MIL/uL (ref 3.87–5.11)
RDW: 17.7 % — ABNORMAL HIGH (ref 11.5–15.5)
WBC: 9.8 10*3/uL (ref 4.0–10.5)
nRBC: 0 % (ref 0.0–0.2)

## 2023-01-18 MED ORDER — TETANUS-DIPHTH-ACELL PERTUSSIS 5-2.5-18.5 LF-MCG/0.5 IM SUSY
0.5000 mL | PREFILLED_SYRINGE | Freq: Once | INTRAMUSCULAR | Status: DC
  Filled 2023-01-18: qty 0.5

## 2023-01-18 MED ORDER — FAMOTIDINE 20 MG PO TABS
20.0000 mg | ORAL_TABLET | Freq: Two times a day (BID) | ORAL | Status: DC
  Administered 2023-01-18 – 2023-01-19 (×2): 20 mg via ORAL
  Filled 2023-01-18 (×2): qty 1

## 2023-01-18 NOTE — Progress Notes (Signed)
FACULTY PRACTICE ANTEPARTUM(COMPREHENSIVE) NOTE  Rebecca Webster is a 42 y.o. [id number] with Estimated Date of Delivery: 03/28/23   By  LMP [redacted]w[redacted]d  who is admitted for PPROM.    Fetal presentation is cephalic. Length of Stay:  31  Days  Date of admission:12/17/2022  Subjective: Reports no acute complaints or concerns overnight. Patient reports the fetal movement as active. Patient reports uterine contraction  activity as none. Patient reports  vaginal bleeding as none. Patient describes fluid per vagina as None.  Vitals:  Blood pressure 121/74, pulse 92, temperature 97.7 F (36.5 C), temperature source Oral, resp. rate 16, height 5\' 10"  (1.778 m), weight 110.7 kg, last menstrual period 06/21/2022, SpO2 98 %, unknown if currently breastfeeding. Vitals:   01/17/23 1245 01/17/23 1802 01/17/23 2015 01/18/23 0842  BP: 127/76 131/77 126/75 121/74  Pulse: (!) 101 (!) 104 100 92  Resp: 18 18 18 16   Temp: 98.8 F (37.1 C) 98.3 F (36.8 C) 98 F (36.7 C) 97.7 F (36.5 C)  TempSrc: Oral Oral Oral Oral  SpO2: 100% 98% 99% 98%  Weight:      Height:       Physical Examination:  General appearance - alert, well appearing, and in no distress Mental status - normal mood, behavior, speech, dress, motor activity, and thought processes Chest -CTAB Heart - normal rate and regular rhythm Abdomen -gravid, soft and nontender Extremities -no edema no calf tenderness bilaterally Skin -warm and dry   Fetal Monitoring:  Baseline: 145 bpm, Variability: moderate, Accelerations: 15x15 accels present x 2, and Decelerations: Absent    reactive  Labs:  Results for orders placed or performed during the hospital encounter of 12/17/22 (from the past 24 hour(s))  CBC   Collection Time: 01/18/23  5:14 AM  Result Value Ref Range   WBC 9.8 4.0 - 10.5 K/uL   RBC 4.28 3.87 - 5.11 MIL/uL   Hemoglobin 9.9 (L) 12.0 - 15.0 g/dL   HCT 54.0 (L) 98.1 - 19.1 %   MCV 74.1 (L) 80.0 - 100.0 fL   MCH 23.1 (L) 26.0 -  34.0 pg   MCHC 31.2 30.0 - 36.0 g/dL   RDW 47.8 (H) 29.5 - 62.1 %   Platelets 221 150 - 400 K/uL   nRBC 0.0 0.0 - 0.2 %  Comprehensive metabolic panel   Collection Time: 01/18/23  5:14 AM  Result Value Ref Range   Sodium 135 135 - 145 mmol/L   Potassium 3.6 3.5 - 5.1 mmol/L   Chloride 106 98 - 111 mmol/L   CO2 20 (L) 22 - 32 mmol/L   Glucose, Bld 96 70 - 99 mg/dL   BUN 7 6 - 20 mg/dL   Creatinine, Ser 3.08 0.44 - 1.00 mg/dL   Calcium 8.7 (L) 8.9 - 10.3 mg/dL   Total Protein 5.5 (L) 6.5 - 8.1 g/dL   Albumin 2.5 (L) 3.5 - 5.0 g/dL   AST 15 15 - 41 U/L   ALT 25 0 - 44 U/L   Alkaline Phosphatase 63 38 - 126 U/L   Total Bilirubin 0.4 0.3 - 1.2 mg/dL   GFR, Estimated >65 >78 mL/min   Anion gap 9 5 - 15    Imaging Studies:    [place] MFM OB FOLLOW UP  Result Date: 01/16/2023 ----------------------------------------------------------------------  OBSTETRICS REPORT                       (Signed Final 01/16/2023 09:35 am) ---------------------------------------------------------------------- Patient Info  ID #:       [id number]                          D.O.B.:  [birth year] (41 yrs)  Name:       Rebecca Webster [facility]                Visit Date: 01/16/2023 07:22 am ---------------------------------------------------------------------- Performed By  Attending:        Ma Rings MD         Secondary Phy.:   Hermina Staggers                                                             MD  Performed By:     Anabel Halon          Address:          [address]                                                             [place], [place]                                                             [address]  Referred By:      [facility]       [facility] ---------------------------------------------------------------------- Orders  #  Description                           Code        Ordered By  1  [place] MFM  OB FOLLOW UP                   21308.65    Harvie Bridge ----------------------------------------------------------------------  #  Order #                     Accession #                Episode #  1  [id number]                   [id number]                 [id number] ---------------------------------------------------------------------- Indications  Premature rupture of membranes - leaking       O42.90  fluid  Cervical cerclage suture present, third        O34.33  trimester  Advanced maternal age multigravida 64+,        O11.523  third trimester  [redacted] weeks gestation of pregnancy                Z3A.29 ---------------------------------------------------------------------- Fetal Evaluation  Num Of Fetuses:         1  Fetal Heart Rate(bpm):  155  Cardiac Activity:       Observed  Presentation:           Cephalic  Placenta:               Anterior  P. Cord Insertion:      Previously visualized  Amniotic Fluid  AFI FV:      Within normal limits  AFI Sum(cm)     %Tile       Largest Pocket(cm)  14.4            49          5.7  RUQ(cm)       RLQ(cm)       LUQ(cm)        LLQ(cm)  0             3.8           5.7            4.9 ---------------------------------------------------------------------- Biometry  BPD:        75  mm     G. Age:  30w 1d         45  %    CI:        73.89   %    70 - 86                                                          FL/HC:      20.5   %    19.2 - 21.4  HC:      277.1  mm     G. Age:  30w 2d         27  %    HC/AC:      1.09        0.99 - 1.21  AC:      255.1  mm     G. Age:  29w 5d         39  %    FL/BPD:     75.6   %    71 - 87  FL:       56.7  mm     G. Age:  29w 5d         32  %    FL/AC:      22.2   %    20 - 24  Est. FW:    1457  gm      3 lb 3 oz     35  % ---------------------------------------------------------------------- OB History  Gravidity:    10        Term:   3        Prem:   2        SAB:   3  Ectopic:      1  Living:  6  ---------------------------------------------------------------------- Gestational Age  LMP:           29w 6d        Date:  06/21/22                 EDD:   03/28/23  U/S Today:     30w 0d                                        EDD:   03/27/23  Best:          29w 6d     Det. By:  LMP  (06/21/22)          EDD:   03/28/23 ---------------------------------------------------------------------- Anatomy  Cranium:               Appears normal         LVOT:                   Previously seen  Cavum:                 Previously seen        Aortic Arch:            Not well visualized  Ventricles:            Previously seen        Ductal Arch:            Not well visualized  Choroid Plexus:        Previously seen        Diaphragm:              Appears normal  Cerebellum:            Previously seen        Stomach:                Appears normal, left                                                                        sided  Posterior Fossa:       Previously seen        Abdomen:                Previously seen  Nuchal Fold:           Not applicable (>20    Abdominal Wall:         Previously seen                         wks GA)  Face:                  Orbits and profile     Cord Vessels:           Previously seen                         previously seen  Lips:                  Previously seen  Kidneys:                Appear normal  Palate:                Previously             Bladder:                Appears normal                         visualized  Thoracic:              Previously seen        Spine:                  Previously seen  Heart:                 Not well visualized    Upper Extremities:      Previously seen  RVOT:                  Previously seen        Lower Extremities:      Previously seen ---------------------------------------------------------------------- Cervix Uterus Adnexa  Cervix  Not visualized (advanced GA >24wks)  Uterus  No abnormality visualized.  ---------------------------------------------------------------------- Comments  This patient has been hospitalized due to PPROM.  The overall EFW obtained today was 3 pounds 3 ounces  (35th percentile).  There was normal amniotic fluid noted with a total AFI of 14.4  cm.  The fetus was in the vertex presentation. ----------------------------------------------------------------------                   Ma Rings, MD Electronically Signed Final Report   01/16/2023 09:35 am ----------------------------------------------------------------------    Medications:  Scheduled  aspirin  162 mg Oral Daily   docusate sodium  100 mg Oral Daily   famotidine  20 mg Oral BID   sodium chloride flush  3 mL Intravenous Q12H   Vitafol-OB+DHA  1 tablet Oral Q lunch   I have reviewed the patient's current medications.  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  Plan for delivery at 34 weeks or any change in maternal-fetal status  Myna Hidalgo, DO Attending Obstetrician & Gynecologist, [facility] for [employer], [facility]

## 2023-01-19 DIAGNOSIS — Z3A30 30 weeks gestation of pregnancy: Secondary | ICD-10-CM | POA: Diagnosis not present

## 2023-01-19 DIAGNOSIS — O42913 Preterm premature rupture of membranes, unspecified as to length of time between rupture and onset of labor, third trimester: Secondary | ICD-10-CM | POA: Diagnosis not present

## 2023-01-19 MED ORDER — PANTOPRAZOLE SODIUM 40 MG PO TBEC
40.0000 mg | DELAYED_RELEASE_TABLET | Freq: Every day | ORAL | Status: DC
  Administered 2023-01-19 – 2023-01-23 (×5): 40 mg via ORAL
  Filled 2023-01-19 (×5): qty 1

## 2023-01-19 MED ORDER — ASPIRIN 81 MG PO TBEC: 81.0000 mg | DELAYED_RELEASE_TABLET | Freq: Every day | ORAL | Status: DC

## 2023-01-19 MED ORDER — ALUM & MAG HYDROXIDE-SIMETH 200-200-20 MG/5ML PO SUSP: 30.0000 mL | Freq: Four times a day (QID) | ORAL | Status: DC | PRN

## 2023-01-19 NOTE — Progress Notes (Signed)
Patient ID: Rebecca Webster, female   DOB: [birth year], 42 y.o.   MRN: [id number] FACULTY PRACTICE ANTEPARTUM(COMPREHENSIVE) NOTE  QUIYANA VAQUERANO is a 42 y.o. [id number] with Estimated Date of Delivery: 03/28/23   By  early ultrasound [redacted]w[redacted]d  who is admitted for PROM.    Fetal presentation is cephalic. Length of Stay:  32  Days  Date of admission:12/17/2022  Subjective: No complaints Patient reports the fetal movement as active. Patient reports uterine contraction  activity as none. Patient reports  vaginal bleeding as none. Patient describes fluid per vagina as None.  Vitals:  Blood pressure 124/71, pulse 96, temperature 98.3 F (36.8 C), temperature source Oral, resp. rate 18, height 5\' 10"  (1.778 m), weight 110.7 kg, last menstrual period 06/21/2022, SpO2 99 %, unknown if currently breastfeeding. Vitals:   01/18/23 1527 01/18/23 1705 01/18/23 1930 01/19/23 0802  BP: (!) 158/84 129/74 121/71 124/71  Pulse: 98 (!) 102 100 96  Resp:  18 18 18   Temp:  97.6 F (36.4 C) 97.8 F (36.6 C) 98.3 F (36.8 C)  TempSrc:  Oral Oral Oral  SpO2:  100% 100% 99%  Weight:      Height:       Physical Examination:  General appearance - alert, well appearing, and in no distress Abdomen - soft, nontender, nondistended, no masses or organomegaly Fundal Height:  size equals dates Pelvic Exam:  examination not indicated Cervical Exam: Not evaluated. Extremities: extremities normal, atraumatic, no cyanosis or edema with DTRs 2+ bilaterally Membranes:ruptured, clear fluid  Fetal Monitoring:  Baseline: 130 bpm, Variability: Good {> 6 bpm), Accelerations: Reactive, and Decelerations: Absent   reactive  Labs:  No results found for this or any previous visit (from the past 24 hour(s)).  Imaging Studies:    [place] MFM OB FOLLOW UP  Result Date: 01/16/2023 ----------------------------------------------------------------------  OBSTETRICS REPORT                       (Signed Final 01/16/2023 09:35 am)  ---------------------------------------------------------------------- Patient Info  ID #:       [id number]                          D.O.B.:  [birth year] (41 yrs)  Name:       Rebecca Webster [facility]                Visit Date: 01/16/2023 07:22 am ---------------------------------------------------------------------- Performed By  Attending:        Ma Rings MD         Secondary Phy.:   Hermina Staggers                                                             MD  Performed By:     Anabel Halon          Address:          [address]                    [place]  [place], [place]                                                             [address]  Referred By:      [facility]       [facility] ---------------------------------------------------------------------- Orders  #  Description                           Code        Ordered By  1  [place] MFM OB FOLLOW UP                   60454.09    KYMBERLY FORSYTH ----------------------------------------------------------------------  #  Order #                     Accession #                Episode #  1  [id number]                   [id number]                 [id number] ---------------------------------------------------------------------- Indications  Premature rupture of membranes - leaking       O42.90  fluid  Cervical cerclage suture present, third        O34.33  trimester  Advanced maternal age multigravida 53+,        O61.523  third trimester  [redacted] weeks gestation of pregnancy                Z3A.29 ---------------------------------------------------------------------- Fetal Evaluation  Num Of Fetuses:         1  Fetal Heart Rate(bpm):  155  Cardiac Activity:       Observed  Presentation:           Cephalic  Placenta:               Anterior  P. Cord Insertion:      Previously visualized  Amniotic Fluid  AFI FV:      Within normal  limits  AFI Sum(cm)     %Tile       Largest Pocket(cm)  14.4            49          5.7  RUQ(cm)       RLQ(cm)       LUQ(cm)        LLQ(cm)  0             3.8           5.7            4.9 ---------------------------------------------------------------------- Biometry  BPD:        75  mm     G. Age:  30w 1d  45  %    CI:        73.89   %    70 - 86                                                          FL/HC:      20.5   %    19.2 - 21.4  HC:      277.1  mm     G. Age:  30w 2d         27  %    HC/AC:      1.09        0.99 - 1.21  AC:      255.1  mm     G. Age:  29w 5d         39  %    FL/BPD:     75.6   %    71 - 87  FL:       56.7  mm     G. Age:  29w 5d         32  %    FL/AC:      22.2   %    20 - 24  Est. FW:    1457  gm      3 lb 3 oz     35  % ---------------------------------------------------------------------- OB History  Gravidity:    10        Term:   3        Prem:   2        SAB:   3  Ectopic:      1        Living:  6 ---------------------------------------------------------------------- Gestational Age  LMP:           29w 6d        Date:  06/21/22                 EDD:   03/28/23  U/S Today:     30w 0d                                        EDD:   03/27/23  Best:          29w 6d     Det. By:  LMP  (06/21/22)          EDD:   03/28/23 ---------------------------------------------------------------------- Anatomy  Cranium:               Appears normal         LVOT:                   Previously seen  Cavum:                 Previously seen        Aortic Arch:            Not well visualized  Ventricles:            Previously seen        Ductal Arch:            Not well visualized  Choroid Plexus:  Previously seen        Diaphragm:              Appears normal  Cerebellum:            Previously seen        Stomach:                Appears normal, left                                                                        sided  Posterior Fossa:       Previously seen        Abdomen:                 Previously seen  Nuchal Fold:           Not applicable (>20    Abdominal Wall:         Previously seen                         wks GA)  Face:                  Orbits and profile     Cord Vessels:           Previously seen                         previously seen  Lips:                  Previously seen        Kidneys:                Appear normal  Palate:                Previously             Bladder:                Appears normal                         visualized  Thoracic:              Previously seen        Spine:                  Previously seen  Heart:                 Not well visualized    Upper Extremities:      Previously seen  RVOT:                  Previously seen        Lower Extremities:      Previously seen ---------------------------------------------------------------------- Cervix Uterus Adnexa  Cervix  Not visualized (advanced GA >24wks)  Uterus  No abnormality visualized. ---------------------------------------------------------------------- Comments  This patient has been hospitalized due to PPROM.  The overall EFW obtained today was 3 pounds 3 ounces  (35th percentile).  There was normal amniotic fluid noted with a total AFI of 14.4  cm.  The fetus was in the vertex presentation. ----------------------------------------------------------------------  Ma Rings, MD Electronically Signed Final Report   01/16/2023 09:35 am ----------------------------------------------------------------------    Medications:  Scheduled  aspirin  162 mg Oral Daily   docusate sodium  100 mg Oral Daily   famotidine  20 mg Oral BID   sodium chloride flush  3 mL Intravenous Q12H   Tdap  0.5 mL Intramuscular Once   Vitafol-OB+DHA  1 tablet Oral Q lunch   I have reviewed the patient's current medications.  ASSESSMENT: [id number] [redacted]w[redacted]d Estimated Date of Delivery: 03/28/23  P/PROM: Cephalic McDonald cerclage in place Previous C section, S/P successful TOLAC    PLAN: >S/P BMZ S/P magnesium  neuroprotection >weekly sonogram >twice daily NSTs >IOL 34 weeks if remains undelivered >Continue in house management  Lazaro Arms 01/19/2023,8:56 AM

## 2023-01-20 DIAGNOSIS — Z3A30 30 weeks gestation of pregnancy: Secondary | ICD-10-CM | POA: Diagnosis not present

## 2023-01-20 DIAGNOSIS — O42913 Preterm premature rupture of membranes, unspecified as to length of time between rupture and onset of labor, third trimester: Secondary | ICD-10-CM | POA: Diagnosis not present

## 2023-01-20 LAB — TYPE AND SCREEN
ABO/RH(D): O POS
Antibody Screen: NEGATIVE

## 2023-01-20 MED ORDER — ASPIRIN 81 MG PO TBEC
81.0000 mg | DELAYED_RELEASE_TABLET | Freq: Every day | ORAL | Status: DC
  Administered 2023-01-20: 81 mg via ORAL
  Filled 2023-01-20: qty 1

## 2023-01-20 MED ORDER — SODIUM CHLORIDE 0.9 % IV SOLN
500.0000 mg | Freq: Once | INTRAVENOUS | Status: AC
  Administered 2023-01-20: 500 mg via INTRAVENOUS
  Filled 2023-01-20: qty 25

## 2023-01-20 NOTE — Progress Notes (Signed)
Patient ID: GABRIANNA BEARS, female   DOB: Dec 25, 1980, 42 y.o.   MRN: [id number] Patient ID: Rebecca Webster, female   DOB: August 09, 1980, 42 y.o.   MRN: [id number] FACULTY PRACTICE ANTEPARTUM(COMPREHENSIVE) NOTE  Rebecca Webster is a 42 y.o. [id number] with Estimated Date of Delivery: 03/28/23   By  early ultrasound [redacted]w[redacted]d   who is admitted for PROM.    Fetal presentation is cephalic. Length of Stay:  33  Days  Date of admission:12/17/2022  Subjective: No complaints Patient reports the fetal movement as active. Patient reports uterine contraction  activity as none. Patient reports  vaginal bleeding as none. Patient describes fluid per vagina as None.  Vitals:  Blood pressure 121/74, pulse 100, temperature 98.2 F (36.8 C), temperature source Oral, resp. rate 17, height 5\' 10"  (1.778 m), weight 110.7 kg, last menstrual period 06/21/2022, SpO2 100 %, unknown if currently breastfeeding. Vitals:   01/19/23 0802 01/19/23 1225 01/19/23 1455 01/19/23 1959  BP: 124/71 (!) 143/73 135/77 121/74  Pulse: 96 (!) 104 98 100  Resp: 18 18 18 17   Temp: 98.3 F (36.8 C) 97.7 F (36.5 C) 98.2 F (36.8 C) 98.2 F (36.8 C)  TempSrc: Oral Oral Oral Oral  SpO2: 99% 100% 100% 100%  Weight:      Height:       Physical Examination:  General appearance - alert, well appearing, and in no distress Abdomen - soft, nontender, nondistended, no masses or organomegaly Fundal Height:  size equals dates Pelvic Exam:  examination not indicated Cervical Exam: Not evaluated. Extremities: extremities normal, atraumatic, no cyanosis or edema with DTRs 2+ bilaterally Membranes:ruptured, clear fluid  Fetal Monitoring:  Baseline: 130 bpm, Variability: Good {> 6 bpm), Accelerations: Reactive, and Decelerations: Absent   reactive  Labs:  No results found for this or any previous visit (from the past 24 hour(s)).  Imaging Studies:    No results found.   Medications:  Scheduled  [START ON 01/19/2026] aspirin EC  81 mg  Oral Daily   docusate sodium  100 mg Oral Daily   pantoprazole  40 mg Oral Daily   sodium chloride flush  3 mL Intravenous Q12H   Tdap  0.5 mL Intramuscular Once   Vitafol-OB+DHA  1 tablet Oral Q lunch   I have reviewed the patient's current medications.  ASSESSMENT: [id number] [redacted]w[redacted]d  Estimated Date of Delivery: 03/28/23  P/PROM: Cephalic McDonald cerclage in place Previous C section, S/P successful TOLAC Anemic, microcytic    PLAN: >S/P BMZ S/P magnesium neuroprotection >weekly sonogram >twice daily NSTs >IOL 34 weeks if remains undelivered >Continue in house management >IV venofer 500 mg today  Lazaro Arms 01/20/2023,8:05 AM

## 2023-01-21 DIAGNOSIS — Z3A30 30 weeks gestation of pregnancy: Secondary | ICD-10-CM | POA: Diagnosis not present

## 2023-01-21 DIAGNOSIS — O42913 Preterm premature rupture of membranes, unspecified as to length of time between rupture and onset of labor, third trimester: Secondary | ICD-10-CM | POA: Diagnosis not present

## 2023-01-21 MED ORDER — ASPIRIN 81 MG PO CHEW
81.0000 mg | CHEWABLE_TABLET | Freq: Every day | ORAL | Status: DC
  Administered 2023-01-21 – 2023-01-22 (×2): 81 mg via ORAL
  Filled 2023-01-21 (×2): qty 1

## 2023-01-21 NOTE — Progress Notes (Signed)
Patient ID: Rebecca Webster, female   DOB: Aug 12, 1980, 42 y.o.   MRN: [id number] FACULTY PRACTICE ANTEPARTUM(COMPREHENSIVE) NOTE  Rebecca Webster is a 42 y.o. [id number] with Estimated Date of Delivery: 03/28/23   By  early ultrasound [redacted]w[redacted]d  who is admitted for PROM.    Fetal presentation is cephalic. Length of Stay:  34  Days  Date of admission:12/17/2022  Subjective: No complaints Patient reports the fetal movement as active. Patient reports uterine contraction  activity as none. Patient reports  vaginal bleeding as none. Patient describes fluid per vagina as None.  Vitals:  Blood pressure (!) 130/57, pulse 95, temperature 98.9 F (37.2 C), temperature source Oral, resp. rate 17, height 5\' 10"  (1.778 m), weight 110.7 kg, last menstrual period 06/21/2022, SpO2 97 %, unknown if currently breastfeeding. Vitals:   01/20/23 1951 01/20/23 2024 01/20/23 2025 01/21/23 0409  BP: 137/69 126/68  (!) 130/57  Pulse: 100 96  95  Resp: 17 18  17   Temp: 97.7 F (36.5 C) 98.3 F (36.8 C)  98.9 F (37.2 C)  TempSrc: Oral Oral  Oral  SpO2: 100% 100% 100% 97%  Weight:      Height:       Physical Examination:  General appearance - alert, well appearing, and in no distress Abdomen - soft, nontender, nondistended, no masses or organomegaly Fundal Height:  size equals dates Pelvic Exam:  examination not indicated Cervical Exam: Not evaluated.  Extremities: extremities normal, atraumatic, no cyanosis or edema with DTRs 2+ bilaterally Membranes:ruptured, clear fluid  Fetal Monitoring:  Baseline: 140 bpm, Variability: Good {> 6 bpm), Accelerations: Reactive, and Decelerations: Absent   reactive  Labs:  Results for orders placed or performed during the hospital encounter of 12/17/22 (from the past 24 hour(s))  Type and screen [facility]   Collection Time: 01/20/23 10:37 AM  Result Value Ref Range   ABO/RH(D) O POS    Antibody Screen NEG    Sample Expiration       01/23/2023,2359 Performed at [facility], [address] [place], [place] [address]     Imaging Studies:    No results found.   Medications:  Scheduled  aspirin EC  81 mg Oral Daily   docusate sodium  100 mg Oral Daily   pantoprazole  40 mg Oral Daily   sodium chloride flush  3 mL Intravenous Q12H   Tdap  0.5 mL Intramuscular Once   Vitafol-OB+DHA  1 tablet Oral Q lunch   I have reviewed the patient's current medications.  ASSESSMENT: [id number] [redacted]w[redacted]d Estimated Date of Delivery: 03/28/23  P/PROM: Cephalic McDonald cerclage in place Previous C section, S/P successful TOLAC Anemic, microcytic  PLAN: No change in care plan >S/P BMZ >S/P magnesium neuroprotection >S/P venofer x 2 >weekly sonogram-->tomorrow >twice daily NSTs >IOL 34 weeks if remains undelivered >Continue in house management   Lazaro Arms 01/21/2023,8:00 AM

## 2023-01-22 ENCOUNTER — Inpatient Hospital Stay (HOSPITAL_COMMUNITY): Payer: Medicaid Other

## 2023-01-22 DIAGNOSIS — Z3A30 30 weeks gestation of pregnancy: Secondary | ICD-10-CM | POA: Diagnosis not present

## 2023-01-22 DIAGNOSIS — Z3A29 29 weeks gestation of pregnancy: Secondary | ICD-10-CM | POA: Diagnosis not present

## 2023-01-22 DIAGNOSIS — O42913 Preterm premature rupture of membranes, unspecified as to length of time between rupture and onset of labor, third trimester: Secondary | ICD-10-CM

## 2023-01-22 DIAGNOSIS — O09523 Supervision of elderly multigravida, third trimester: Secondary | ICD-10-CM | POA: Diagnosis not present

## 2023-01-22 DIAGNOSIS — O3433 Maternal care for cervical incompetence, third trimester: Secondary | ICD-10-CM

## 2023-01-22 NOTE — Progress Notes (Signed)
FACULTY PRACTICE ANTEPARTUM COMPREHENSIVE PROGRESS NOTE  Rebecca Webster is a 42 y.o. [id number] at [redacted]w[redacted]d who is admitted for PPROM with McDonald cerclage in place.  Estimated Date of Delivery: 03/28/23 Fetal presentation is cephalic (6/12).  Length of Stay:  35 Days. Admitted 12/17/2022  Subjective: Doing well today with no complaints.  No ctx, VB, LOF. +FM.  Vitals:  Blood pressure 123/75, pulse (!) 102, temperature 97.9 F (36.6 C), temperature source Oral, resp. rate 18, height 5\' 10"  (1.778 m), weight 110.7 kg, last menstrual period 06/21/2022, SpO2 100 %, unknown if currently breastfeeding. Physical Examination: CONSTITUTIONAL: Well-developed, well-nourished female in no acute distress.  CARDIOVASCULAR: Mild tachycardia RESPIRATORY: Effort normal, no problems with respiration noted ABDOMEN: Soft, nontender, nondistended, gravid. CERVIX: Dilation:  (cerclage in place)  Fetal monitoring (from 6/17 PM): FHR: 140 bpm, Variability: moderate, Accelerations: Present, Decelerations: Absent  Uterine activity: flat  Results for orders placed or performed during the hospital encounter of 12/17/22 (from the past 48 hour(s))  Type and screen [facility]     Status: None   Collection Time: 01/20/23 10:37 AM  Result Value Ref Range   ABO/RH(D) O POS    Antibody Screen NEG    Sample Expiration      01/23/2023,2359 Performed at [facility], [address] [place], [place] [address]    No results found.  Current scheduled medications  aspirin  81 mg Oral QHS   docusate sodium  100 mg Oral Daily   pantoprazole  40 mg Oral Daily   sodium chloride flush  3 mL Intravenous Q12H   Tdap  0.5 mL Intramuscular Once   Vitafol-OB+DHA  1 tablet Oral Q lunch   I have reviewed the patient's current medications.  ASSESSMENT: Principal Problem:   Preterm premature rupture of membranes, antepartum Active Problems:   Supervision of high-risk pregnancy   History of vaginal  delivery following previous cesarean delivery   Cervical insufficiency during pregnancy in second trimester, antepartum   Cervical cerclage suture present   PLAN: PPROM, cerclage in place - s/p latency abx - no e/o PTL, IAI or abruption at this time - continue expectant management until 34 weeks or sooner prn   FWB - dNSTs, weekly [place] for AFI w/ weekly BPP to start at 31 weeks. AFI ordered for today. - S/p BMZ 5/14-15 - Mag eligible - Growth 6/12 @ 29/6 1457g (35%) - S/p NICU consult    3. Gestational hypertension - Met criteria 6/9 for mild range BP > 4h apart - Serum preE labs 6/14 normal, trending weekly   5. Anemia - most recent Hgb 9.9 on 6/14 - s/p IV iron 6/7   5. Routine, hx CS - Normal 1h - TOLAC consent previously signed (5/16)   Dispo: Continue routine antenatal care. Anticipate delivery by 34 weeks.   Harvie Bridge, MD Obstetrician & Gynecologist, [facility] for [employer], [facility]

## 2023-01-23 ENCOUNTER — Other Ambulatory Visit: Payer: Self-pay | Admitting: *Deleted

## 2023-01-23 ENCOUNTER — Inpatient Hospital Stay (HOSPITAL_COMMUNITY): Payer: Medicaid Other

## 2023-01-23 DIAGNOSIS — Z3A30 30 weeks gestation of pregnancy: Secondary | ICD-10-CM | POA: Diagnosis not present

## 2023-01-23 DIAGNOSIS — O42913 Preterm premature rupture of membranes, unspecified as to length of time between rupture and onset of labor, third trimester: Secondary | ICD-10-CM

## 2023-01-23 DIAGNOSIS — B3731 Acute candidiasis of vulva and vagina: Secondary | ICD-10-CM

## 2023-01-23 DIAGNOSIS — O42919 Preterm premature rupture of membranes, unspecified as to length of time between rupture and onset of labor, unspecified trimester: Secondary | ICD-10-CM

## 2023-01-23 DIAGNOSIS — O99013 Anemia complicating pregnancy, third trimester: Secondary | ICD-10-CM | POA: Diagnosis present

## 2023-01-23 HISTORY — DX: Acute candidiasis of vulva and vagina: B37.31

## 2023-01-23 LAB — RUPTURE OF MEMBRANE (ROM)PLUS: Rom Plus: NEGATIVE

## 2023-01-23 LAB — TYPE AND SCREEN
ABO/RH(D): O POS
Antibody Screen: NEGATIVE

## 2023-01-23 MED ORDER — PANTOPRAZOLE SODIUM 40 MG PO TBEC: 40.0000 mg | DELAYED_RELEASE_TABLET | Freq: Every day | ORAL | 1 refills | Status: DC

## 2023-01-23 MED ORDER — MICONAZOLE NITRATE 2 % VA CREA: 1.0000 | TOPICAL_CREAM | Freq: Every day | VAGINAL | 2 refills | Status: DC

## 2023-01-23 MED ORDER — FERROUS SULFATE 325 (65 FE) MG PO TABS: 325.0000 mg | ORAL_TABLET | Freq: Every day | ORAL | Status: DC

## 2023-01-23 NOTE — Discharge Summary (Signed)
Physician Discharge Summary  Patient ID: Rebecca Webster MRN: [id number] DOB/AGE: [birth year] 42 y.o.  Admit date: 12/17/2022 Discharge date: 01/23/2023 OBGYN Clinic: [facility]  Admission Diagnoses: Pregnancy at 25/5. PPROM. Prophylatic cerclage in place. History of VBAC. AMA  Discharge Diagnoses: Pregnancy at 30/6. Likely PPROM with small leak and re-seal. Cerclage still in place. Gestational HTN. Anemia. Vulvovaginal candidiasis. AMA   Discharged Condition: good  Hospital Course: Patient admitted for PPROM, based + amnisure, and received betamethasone course on 5/14-15 and a week of latency abx; she never needed and did not receive any tocolytics during her entire hospitalization.  She was expectantly managed and had a repeat amnisure on 5/19, due to persistently normal AF levels, and this was also positive, but her exam was negative for ROM.  She had persistently normal AFIs, weekly, and denied any LOF so pt amenable to repeat testing on 6/19, as recommended by MFM. ROM plus negative (Amnisure has been discontinued here in the interim) and AFI still normal. Exam was also negative for ROM and patient was amenable to d/c to home with weekly AFIs and MD visits -6/19: ceph, afi 11.1 -6/18: ceph, afi 10.9 -6/12: ceph, 35%, 1457gm, ac 39%, afi 14 *Yeast infection: h/o flagyl x 7d, diflucan x 1. Patient discharged home on monistat 7. *GHTN: Diagnosed on 6/9. Continue low dose ASA and qwk labs (due again on 6/21) *Anemia: s/p Venofer 500 x 2 (6/7 and 6/16) *AMA: no issues *h/o VBAC: consent signed 5/16 *PPx: SCDs, OOB ad lib  Consults:  MFM  Discharge Exam: Blood pressure 127/78, pulse 96, temperature 98.2 F (36.8 C), temperature source Oral, resp. rate 16, height 5\' 10"  (1.778 m), weight 110.7 kg, last menstrual period 06/21/2022, SpO2 99 %, unknown if currently breastfeeding. General appearance: NAD Resp: clear to auscultation bilaterally Cardio: regular rate and rhythm, S1, S2 normal,  no murmur, click, rub or gallop GI: soft, nttp, gravid Pelvic: Mild to moderate amount of white clumpy discharge and slight watery d/c but consistent with yeast infection and no amniotic fluid. Cervix visually closed/long and stitch seen, anterior and appears normal Extremities: extremities normal, atraumatic, no cyanosis or edema Skin: Skin color, texture, turgor normal. No rashes or lesions Neurologic: Grossly normal  Disposition: Discharge disposition: 01-Home or Self Care       Discharge Instructions     Discharge patient   Complete by: As directed    Discharge disposition: 01-Home or Self Care   Discharge patient date: 01/23/2023   Discharge patient   Complete by: As directed    Discharge disposition: 01-Home or Self Care   Discharge patient date: 01/23/2023      Allergies as of 01/23/2023   No Known Allergies      Medication List     STOP taking these medications    NIFEdipine 30 MG 24 hr tablet Commonly known as: PROCARDIA-XL/NIFEDICAL-XL       TAKE these medications    aspirin 81 MG chewable tablet Chew 2 tablets (162 mg total) by mouth daily.   miconazole 2 % vaginal cream Commonly known as: MONISTAT 7 Place 1 Applicatorful vaginally at bedtime for 14 days.   pantoprazole 40 MG tablet Commonly known as: PROTONIX Take 1 tablet (40 mg total) by mouth daily. Start taking on: January 24, 2023   polyethylene glycol powder 17 GM/SCOOP powder Commonly known as: GLYCOLAX/MIRALAX Take 1 Container by mouth once.   PROBIOTIC PO Take 1 capsule by mouth in the morning. [employer] Dr. Gillermina Phy Women's  Probiotics   progesterone 200 MG capsule Commonly known as: Prometrium Take 1 capsule (200 mg total) by mouth daily.   Vitafol Ultra 29-0.6-0.4-200 MG Caps Take 1 capsule by mouth daily.   VITAMIN C PO Take 1 tablet by mouth 2 (two) times a week.        Follow-up Information     [facility]. Schedule an appointment as soon as possible for  a visit in 1 week(s).   Specialty: Obstetrics and Gynecology Why: You will need a 1 week repeat ultrasound to check the water level around the baby and a doctor's visit.  Call the office tomorrow afternoon if you have not heard about this appointment Contact information: [address] [place] [address] [phone]               FT office also to have patient come in on Friday for weekly labs  Signed: [place] Bing 01/23/2023, 2:14 PM

## 2023-01-23 NOTE — Progress Notes (Signed)
OB Note  ROM plus done and then sterile spec exam. Mild to moderate amount of white clumpy discharge and slight watery d/c but consistent with yeast infection and no amniotic fluid. Cervix visually closed/long and stitch seen, anterior and appears normal  Negative exam. If negative ROM plus, repeat AFI and if negative, okay for d/c to home. Pt s/p flagyl and diflucan treatment. If negative tests today, can do vaginal monistat therapy  Cornelia Copa MD Attending Center for [facility] (Faculty Practice) 01/23/2023 Time: 1145am

## 2023-01-23 NOTE — Progress Notes (Signed)
Patient given discharge instructions, she denies any questions or concerns. Patient ambulatory off unit.

## 2023-01-23 NOTE — Progress Notes (Signed)
Daily Antepartum Note  Admission Date: 12/17/2022 Current Date: 01/23/2023 10:58 AM  Rebecca Webster is a 42 y.o. [id number] at [redacted]w[redacted]d, admitted for PPROM.  Pregnancy complicated by: Patient Active Problem List   Diagnosis Date Noted   Anemia affecting pregnancy in third trimester 01/23/2023   Preterm premature rupture of membranes, antepartum 12/18/2022   Cervical cerclage suture present 12/18/2022   Cervical insufficiency during pregnancy in second trimester, antepartum 09/22/2022   AMA (advanced maternal age) multigravida 35+ 09/12/2022   Supervision of high-risk pregnancy 08/20/2022   History of vaginal delivery following previous cesarean delivery 08/20/2022   History of gestational hypertension 08/20/2022   History of preterm premature rupture of membranes 08/20/2022   History of cervical cerclage 09/29/2019    Overnight/24hr events:  None   Subjective:  No OB s/s. Patient continues to deny any LOF  Objective:    Current Vital Signs 24h Vital Sign Ranges  T 98.2 F (36.8 C) Temp  Avg: 98 F (36.7 C)  Min: 97.8 F (36.6 C)  Max: 98.2 F (36.8 C)  BP 131/73 BP  Min: 131/73  Max: 149/83  HR 93 Pulse  Avg: 96.2  Min: 90  Max: 100  RR 16 Resp  Avg: 16.8  Min: 16  Max: 18  SaO2 100 % Room Air SpO2  Avg: 99.8 %  Min: 99 %  Max: 100 %       24 Hour I/O Current Shift I/O  Time Ins Outs No intake/output data recorded. No intake/output data recorded.   Fetal Heart Tones: NST pending for today  Physical exam: General: Well nourished, well developed female in no acute distress. Abdomen: gravid nttp Respiratory: No respiratory distress Extremities: no clubbing, cyanosis or edema Skin: Warm and dry.   Medications: Current Facility-Administered Medications  Medication Dose Route Frequency Provider Last Rate Last Admin   acetaminophen (TYLENOL) tablet 1,000 mg  1,000 mg Oral Q6H PRN Lennart Pall, MD       alum & mag hydroxide-simeth (MAALOX/MYLANTA) 200-200-20  MG/5ML suspension 30 mL  30 mL Oral Q6H PRN Adam Phenix, MD       aspirin chewable tablet 81 mg  81 mg Oral QHS [place]ler Bing, MD   81 mg at 01/22/23 2111   calcium carbonate (TUMS - dosed in mg elemental calcium) chewable tablet 400 mg of elemental calcium  2 tablet Oral Q4H PRN Harvie Bridge C, MD   400 mg of elemental calcium at 01/18/23 1815   cyclobenzaprine (FLEXERIL) tablet 10 mg  10 mg Oral TID PRN Lennart Pall, MD       docusate sodium (COLACE) capsule 100 mg  100 mg Oral Daily Harvie Bridge C, MD   100 mg at 01/23/23 5409   ferrous sulfate tablet 325 mg  325 mg Oral Q breakfast Brule Bing, MD       pantoprazole (PROTONIX) EC tablet 40 mg  40 mg Oral Daily Adam Phenix, MD   40 mg at 01/23/23 0914   polyethylene glycol (MIRALAX / GLYCOLAX) packet 17 g  17 g Oral Daily PRN Lennart Pall, MD   17 g at 01/16/23 1058   senna (SENOKOT) tablet 8.6 mg  1 tablet Oral Daily PRN Lennart Pall, MD       simethicone [facility]) chewable tablet 80 mg  80 mg Oral QID PRN Warden Fillers, MD   80 mg at 12/27/22 1930   sodium chloride flush (NS) 0.9 % injection 3 mL  3  mL Intravenous Q12H Warden Fillers, MD   3 mL at 01/23/23 0914   Tdap (BOOSTRIX) injection 0.5 mL  0.5 mL Intramuscular Once Myna Hidalgo, DO       Vitafol-OB+DHA 65-1 & 250 MG MISC 1 tablet  1 tablet Oral Q lunch Lennart Pall, MD   1 tablet at 01/22/23 1140    Labs: no new labs  Radiology:  6/18: ceph, afi 10.9 6/12: ceph, 35%, 1457gm, ac 39%, afi 14  Assessment & Plan:  Patient stable  *Pregnancy: follow up NST for today *PPROM: Dr. Darra Lis yesterday recommend repeat testing today and if all negative then patient fine for d/c to home. I discussed this with her and she is amenable this. ROM plus is now used and studies show equivocal to each other. Will do that first and then spec exam -s/p aminsure + x 2 (last on 5/19) & s/p latency abx, flagyl x 7d, diflucan x 1  *Preterm: Mg  PRN for fetal NP -s/p bmz on 5/14 and 5/15.  -s/p NICU consult *Cerclage: prophylactic mcdonald cerclage with ticron stitch still in place. Remove for any s/s of labor or need for delivery.  *GHTN: Diagnosed on 6/9. Continue low dose ASA and qwk labs (due again on 6/21) *Anemia: s/p Venofer 500 x 2 (6/7 and 6/16) *AMA: no issues *h/o VBAC: consent signed 5/16 *PPx: SCDs, OOB ad lib  Cornelia Copa MD Attending Center for [facility] (Faculty Practice) GYN Consult Phone: [phone] (M-F, [phone]) & [phone]  (Off hours, weekends, holidays)

## 2023-01-24 ENCOUNTER — Encounter: Payer: Self-pay | Admitting: *Deleted

## 2023-01-25 ENCOUNTER — Other Ambulatory Visit: Payer: Medicaid Other

## 2023-01-25 DIAGNOSIS — O133 Gestational [pregnancy-induced] hypertension without significant proteinuria, third trimester: Secondary | ICD-10-CM

## 2023-01-26 LAB — PROTEIN / CREATININE RATIO, URINE
Creatinine, Urine: 120.2 mg/dL
Protein, Ur: 18.5 mg/dL
Protein/Creat Ratio: 154 mg/g creat (ref 0–200)

## 2023-01-26 LAB — COMPREHENSIVE METABOLIC PANEL
ALT: 17 IU/L (ref 0–32)
AST: 19 IU/L (ref 0–40)
Albumin: 3.7 g/dL — ABNORMAL LOW (ref 3.9–4.9)
Alkaline Phosphatase: 80 IU/L (ref 44–121)
BUN/Creatinine Ratio: 17 (ref 9–23)
BUN: 8 mg/dL (ref 6–24)
Bilirubin Total: <0.2 mg/dL (ref 0.0–1.2)
CO2: 20 mmol/L (ref 20–29)
Calcium: 9.6 mg/dL (ref 8.7–10.2)
Chloride: 103 mmol/L (ref 96–106)
Creatinine, Ser: 0.48 mg/dL — ABNORMAL LOW (ref 0.57–1.00)
Globulin, Total: 2.4 g/dL (ref 1.5–4.5)
Glucose: 102 mg/dL — ABNORMAL HIGH (ref 70–99)
Potassium: 3.9 mmol/L (ref 3.5–5.2)
Sodium: 137 mmol/L (ref 134–144)
Total Protein: 6.1 g/dL (ref 6.0–8.5)
eGFR: 122 mL/min/{1.73_m2} (ref >59)

## 2023-01-26 LAB — CBC
Hematocrit: 35.3 % (ref 34.0–46.6)
Hemoglobin: 11.1 g/dL (ref 11.1–15.9)
MCH: 24.1 pg — ABNORMAL LOW (ref 26.6–33.0)
MCHC: 31.4 g/dL — ABNORMAL LOW (ref 31.5–35.7)
MCV: 77 fL — ABNORMAL LOW (ref 79–97)
Platelets: 241 10*3/uL (ref 150–450)
RBC: 4.61 x10E6/uL (ref 3.77–5.28)
RDW: 18.1 % — ABNORMAL HIGH (ref 11.7–15.4)
WBC: 9 10*3/uL (ref 3.4–10.8)

## 2023-01-30 ENCOUNTER — Other Ambulatory Visit: Payer: Self-pay | Admitting: Maternal & Fetal Medicine

## 2023-01-30 ENCOUNTER — Ambulatory Visit (HOSPITAL_BASED_OUTPATIENT_CLINIC_OR_DEPARTMENT_OTHER): Payer: Medicaid Other

## 2023-01-30 ENCOUNTER — Ambulatory Visit: Payer: Medicaid Other | Admitting: *Deleted

## 2023-01-30 VITALS — BP 137/75 | HR 97

## 2023-01-30 DIAGNOSIS — O09523 Supervision of elderly multigravida, third trimester: Secondary | ICD-10-CM

## 2023-01-30 DIAGNOSIS — O3433 Maternal care for cervical incompetence, third trimester: Secondary | ICD-10-CM

## 2023-01-30 DIAGNOSIS — O133 Gestational [pregnancy-induced] hypertension without significant proteinuria, third trimester: Secondary | ICD-10-CM | POA: Diagnosis not present

## 2023-01-30 DIAGNOSIS — O09213 Supervision of pregnancy with history of pre-term labor, third trimester: Secondary | ICD-10-CM | POA: Diagnosis not present

## 2023-01-30 DIAGNOSIS — Z3A31 31 weeks gestation of pregnancy: Secondary | ICD-10-CM

## 2023-01-30 DIAGNOSIS — O42919 Preterm premature rupture of membranes, unspecified as to length of time between rupture and onset of labor, unspecified trimester: Secondary | ICD-10-CM

## 2023-01-30 DIAGNOSIS — O418X30 Other specified disorders of amniotic fluid and membranes, third trimester, not applicable or unspecified: Secondary | ICD-10-CM

## 2023-01-30 DIAGNOSIS — O09293 Supervision of pregnancy with other poor reproductive or obstetric history, third trimester: Secondary | ICD-10-CM | POA: Diagnosis not present

## 2023-01-31 ENCOUNTER — Ambulatory Visit (INDEPENDENT_AMBULATORY_CARE_PROVIDER_SITE_OTHER): Payer: Medicaid Other | Admitting: Obstetrics & Gynecology

## 2023-01-31 ENCOUNTER — Encounter: Payer: Self-pay | Admitting: Obstetrics & Gynecology

## 2023-01-31 VITALS — BP 133/81 | HR 93 | Wt 264.8 lb

## 2023-01-31 DIAGNOSIS — O0992 Supervision of high risk pregnancy, unspecified, second trimester: Secondary | ICD-10-CM

## 2023-01-31 DIAGNOSIS — O42919 Preterm premature rupture of membranes, unspecified as to length of time between rupture and onset of labor, unspecified trimester: Secondary | ICD-10-CM

## 2023-01-31 DIAGNOSIS — O42913 Preterm premature rupture of membranes, unspecified as to length of time between rupture and onset of labor, third trimester: Secondary | ICD-10-CM

## 2023-01-31 DIAGNOSIS — Z3A32 32 weeks gestation of pregnancy: Secondary | ICD-10-CM

## 2023-01-31 DIAGNOSIS — O3433 Maternal care for cervical incompetence, third trimester: Secondary | ICD-10-CM

## 2023-01-31 DIAGNOSIS — O0993 Supervision of high risk pregnancy, unspecified, third trimester: Secondary | ICD-10-CM

## 2023-01-31 NOTE — Progress Notes (Signed)
HIGH-RISK PREGNANCY VISIT Patient name: Rebecca Webster MRN [id number]  Date of birth: Oct 30, 1980 Chief Complaint:   Routine Prenatal Visit (Blood pressure malfunction, need one order baby scripts)  History of Present Illness:   Rebecca Webster is a 43 y.o. [id number] female at [redacted]w[redacted]d with an Estimated Date of Delivery: 03/28/23 being seen today for ongoing management of a high-risk pregnancy complicated by cervical insufficiency, ?P/PROM s/p long hospitalization now with normal AFI and negative amniosure.    Today she reports no complaints. Contractions: Not present.  .  Movement: Present. denies leaking of fluid.      09/12/2022    1:42 PM 04/27/2022    8:41 AM  Depression screen PHQ 2/9  Decreased Interest 0 0  Down, Depressed, Hopeless 0 0  PHQ - 2 Score 0 0  Altered sleeping 2   Tired, decreased energy 1   Change in appetite 0   Feeling bad or failure about yourself  0   Trouble concentrating 0   Moving slowly or fidgety/restless 0   Suicidal thoughts 0   PHQ-9 Score 3         09/12/2022    1:43 PM  GAD 7 : Generalized Anxiety Score  Nervous, Anxious, on Edge 1  Control/stop worrying 0  Worry too much - different things 0  Trouble relaxing 0  Restless 0  Easily annoyed or irritable 0  Afraid - awful might happen 0  Total GAD 7 Score 1     Review of Systems:   Pertinent items are noted in HPI Denies abnormal vaginal discharge w/ itching/odor/irritation, headaches, visual changes, shortness of breath, chest pain, abdominal pain, severe nausea/vomiting, or problems with urination or bowel movements unless otherwise stated above. Pertinent History Reviewed:  Reviewed past medical,surgical, social, obstetrical and family history.  Reviewed problem list, medications and allergies. Physical Assessment:   Vitals:   01/31/23 1148  BP: 133/81  Pulse: 93  Weight: 264 lb 12.8 oz (120.1 kg)  Body mass index is 37.99 kg/m.           Physical Examination:   General  appearance: alert, well appearing, and in no distress  Mental status: alert, oriented to person, place, and time  Skin: warm & dry   Extremities: Edema: None    Cardiovascular: normal heart rate noted  Respiratory: normal respiratory effort, no distress  Abdomen: gravid, soft, non-tender  Pelvic: Cervical exam deferred         Fetal Status:     Movement: Present    Fetal Surveillance Testing today: yesterday sonogram normal   Chaperone:     No results found for this or any previous visit (from the past 24 hour(s)).  Assessment & Plan:  High-risk pregnancy: [id number] at [redacted]w[redacted]d with an Estimated Date of Delivery: 03/28/23      ICD-10-CM   1. Supervision of high risk pregnancy in second trimester  O09.92     2. Cervical cerclage suture present in third trimester  O34.33     3. Preterm premature rupture of membranes, antepartum-->resealed?  O42.919         Meds: No orders of the defined types were placed in this encounter.   Orders: No orders of the defined types were placed in this encounter.    Labs/procedures today: none  Treatment Plan:  weekly BPPs    Follow-up: Return in about 1 week (around 02/07/2023) for set up weekly BPPs + HROB visits.   Future Appointments  Date Time Provider  Department Center  02/01/2023  9:00 AM [facility]  02/08/2023  9:15 AM [facility]  02/15/2023  9:00 AM [facility]  02/22/2023  8:45 AM [facility]    No orders of the defined types were placed in this encounter.  Lazaro Arms  Attending Physician for the Center for [facility] 01/31/2023 12:18 PM

## 2023-02-01 ENCOUNTER — Other Ambulatory Visit: Payer: Self-pay | Admitting: *Deleted

## 2023-02-01 ENCOUNTER — Other Ambulatory Visit: Payer: Medicaid Other

## 2023-02-01 DIAGNOSIS — O0993 Supervision of high risk pregnancy, unspecified, third trimester: Secondary | ICD-10-CM

## 2023-02-01 DIAGNOSIS — O133 Gestational [pregnancy-induced] hypertension without significant proteinuria, third trimester: Secondary | ICD-10-CM

## 2023-02-03 ENCOUNTER — Other Ambulatory Visit: Payer: Self-pay

## 2023-02-03 ENCOUNTER — Inpatient Hospital Stay (HOSPITAL_COMMUNITY)
Admission: AD | Admit: 2023-02-03 | Discharge: 2023-02-04 | Disposition: A | Discharge status: Home / Self Care | Payer: Medicaid Other | Attending: Obstetrics & Gynecology | Admitting: Obstetrics & Gynecology

## 2023-02-03 DIAGNOSIS — O09523 Supervision of elderly multigravida, third trimester: Secondary | ICD-10-CM | POA: Insufficient documentation

## 2023-02-03 DIAGNOSIS — O26893 Other specified pregnancy related conditions, third trimester: Secondary | ICD-10-CM | POA: Insufficient documentation

## 2023-02-03 DIAGNOSIS — Z711 Person with feared health complaint in whom no diagnosis is made: Secondary | ICD-10-CM

## 2023-02-03 DIAGNOSIS — Z3A32 32 weeks gestation of pregnancy: Secondary | ICD-10-CM | POA: Insufficient documentation

## 2023-02-03 DIAGNOSIS — O0943 Supervision of pregnancy with grand multiparity, third trimester: Secondary | ICD-10-CM | POA: Insufficient documentation

## 2023-02-03 DIAGNOSIS — R1033 Periumbilical pain: Secondary | ICD-10-CM | POA: Insufficient documentation

## 2023-02-03 HISTORY — DX: Thalassemia minor: D56.3

## 2023-02-03 NOTE — MAU Note (Signed)
.  GINA CERVANTEZ is a 42 y.o. at [redacted]w[redacted]d here in MAU reporting: dull pain across middle of abdomen that comes and goes. Was here for a month and a half for suspected ROM with positive amnisure was discharge home last week. Denies HA, visual disturbances, epigastric pain, VB or LOF. +FM.   Onset of complaint: 2130 Pain score: 5 Vitals:   02/03/23 2349  BP: (!) 140/84  Pulse: 91  Resp: 19  Temp: 98.4 F (36.9 C)  SpO2: 99%     FHT:142 Lab orders placed from triage:  UA

## 2023-02-04 ENCOUNTER — Encounter (HOSPITAL_COMMUNITY): Payer: Self-pay | Admitting: Obstetrics & Gynecology

## 2023-02-04 ENCOUNTER — Inpatient Hospital Stay (HOSPITAL_COMMUNITY): Payer: Medicaid Other

## 2023-02-04 DIAGNOSIS — R1033 Periumbilical pain: Secondary | ICD-10-CM | POA: Diagnosis not present

## 2023-02-04 DIAGNOSIS — O0943 Supervision of pregnancy with grand multiparity, third trimester: Secondary | ICD-10-CM | POA: Diagnosis not present

## 2023-02-04 DIAGNOSIS — O09523 Supervision of elderly multigravida, third trimester: Secondary | ICD-10-CM | POA: Diagnosis not present

## 2023-02-04 DIAGNOSIS — O26893 Other specified pregnancy related conditions, third trimester: Secondary | ICD-10-CM | POA: Diagnosis not present

## 2023-02-04 DIAGNOSIS — Z3A32 32 weeks gestation of pregnancy: Secondary | ICD-10-CM

## 2023-02-04 DIAGNOSIS — R1011 Right upper quadrant pain: Secondary | ICD-10-CM | POA: Diagnosis not present

## 2023-02-04 LAB — PROTEIN / CREATININE RATIO, URINE
Creatinine, Urine: 25 mg/dL
Total Protein, Urine: <6 mg/dL

## 2023-02-04 LAB — COMPREHENSIVE METABOLIC PANEL
ALT: 19 U/L (ref 0–44)
AST: 16 U/L (ref 15–41)
Albumin: 2.7 g/dL — ABNORMAL LOW (ref 3.5–5.0)
Alkaline Phosphatase: 68 U/L (ref 38–126)
Anion gap: 8 (ref 5–15)
BUN: 7 mg/dL (ref 6–20)
CO2: 21 mmol/L — ABNORMAL LOW (ref 22–32)
Calcium: 9.4 mg/dL (ref 8.9–10.3)
Chloride: 107 mmol/L (ref 98–111)
Creatinine, Ser: 0.43 mg/dL — ABNORMAL LOW (ref 0.44–1.00)
GFR, Estimated: >60 mL/min (ref >60)
Glucose, Bld: 95 mg/dL (ref 70–99)
Potassium: 3.7 mmol/L (ref 3.5–5.1)
Sodium: 136 mmol/L (ref 135–145)
Total Bilirubin: 0.4 mg/dL (ref 0.3–1.2)
Total Protein: 5.7 g/dL — ABNORMAL LOW (ref 6.5–8.1)

## 2023-02-04 LAB — URINALYSIS, ROUTINE W REFLEX MICROSCOPIC
Bilirubin Urine: NEGATIVE
Glucose, UA: NEGATIVE mg/dL
Ketones, ur: NEGATIVE mg/dL
Leukocytes,Ua: NEGATIVE
Nitrite: NEGATIVE
Protein, ur: NEGATIVE mg/dL
Specific Gravity, Urine: 1.005 (ref 1.005–1.030)
pH: 6 (ref 5.0–8.0)

## 2023-02-04 LAB — CBC WITH DIFFERENTIAL/PLATELET
Abs Immature Granulocytes: 0.29 10*3/uL — ABNORMAL HIGH (ref 0.00–0.07)
Basophils Absolute: 0 10*3/uL (ref 0.0–0.1)
Basophils Relative: 1 %
Eosinophils Absolute: 0.1 10*3/uL (ref 0.0–0.5)
Eosinophils Relative: 2 %
HCT: 33.6 % — ABNORMAL LOW (ref 36.0–46.0)
Hemoglobin: 10.6 g/dL — ABNORMAL LOW (ref 12.0–15.0)
Immature Granulocytes: 4 %
Lymphocytes Relative: 23 %
Lymphs Abs: 1.8 10*3/uL (ref 0.7–4.0)
MCH: 24.4 pg — ABNORMAL LOW (ref 26.0–34.0)
MCHC: 31.5 g/dL (ref 30.0–36.0)
MCV: 77.2 fL — ABNORMAL LOW (ref 80.0–100.0)
Monocytes Absolute: 0.8 10*3/uL (ref 0.1–1.0)
Monocytes Relative: 10 %
Neutro Abs: 4.6 10*3/uL (ref 1.7–7.7)
Neutrophils Relative %: 60 %
Platelets: 205 10*3/uL (ref 150–400)
RBC: 4.35 MIL/uL (ref 3.87–5.11)
RDW: 18.3 % — ABNORMAL HIGH (ref 11.5–15.5)
WBC: 7.6 10*3/uL (ref 4.0–10.5)
nRBC: 0 % (ref 0.0–0.2)

## 2023-02-04 LAB — LIPASE, BLOOD: Lipase: 33 U/L (ref 11–51)

## 2023-02-04 LAB — AMYLASE: Amylase: 73 U/L (ref 28–100)

## 2023-02-04 NOTE — MAU Provider Note (Signed)
History     CSN: [id number]  Arrival date and time: 02/03/23 2339   Event Date/Time   First Provider Initiated Contact with Patient 02/04/23 0039      Chief Complaint  Patient presents with   Abdominal Pain   Rebecca Webster , a  42 y.o. [id number] at [redacted]w[redacted]d presents to MAU with complaints of abdominal pain that started 3 hours ago. She states that the pain is "come and go," and describes it as "dull and achy." She currently rates pain a 5/10 and denies attempting to relieve symptoms. She denies worsening or alleviating symptoms. She also denies abnormal vaginal discharge, and urinary symptoms. She states that she was recently discharged from [facility] for Suspicion for ROM. She denies headache, blurred vision, and nausea and vomiting. She also denies new onset of swelling and epigastric pain. She endorses positive fetal movement.          OB History     Gravida  10   Para  5   Term  3   Preterm  2   AB  4   Living  6      SAB  3   IAB      Ectopic  1   Multiple  1   Live Births  6           Past Medical History:  Diagnosis Date   Anemia    not currently anemic   Beta thalassemia trait    Gestational HTN    Incompetent cervix    Vulvovaginal candidiasis 01/23/2023    Past Surgical History:  Procedure Laterality Date   ABDOMINOPLASTY     CERVICAL CERCLAGE N/A 05/06/2019   Procedure: CERCLAGE CERVICAL;  Surgeon: Maxie Better, MD;  Location: [facility];  Service: Gynecology;  Laterality: N/A;   CERVICAL CERCLAGE N/A 09/29/2019   Procedure: CERCLAGE CERVICAL;  Surgeon: Maxie Better, MD;  Location: [facility];  Service: Gynecology;  Laterality: N/A;  EDD: 03/28/20   CERVICAL CERCLAGE N/A 09/22/2022   Procedure: CERCLAGE CERVICAL;  Surgeon: Lennart Pall, MD;  Location: [facility];  Service: Gynecology;  Laterality: N/A;   CESAREAN SECTION  2001   Reversal of tubal ligation     TUBAL LIGATION     WISDOM TOOTH EXTRACTION      Family  History  Problem Relation Age of Onset   Heart disease Maternal Grandmother    Stroke Maternal Grandmother    Cancer Father        prostate    Social History   Tobacco Use   Smoking status: Never   Smokeless tobacco: Never  Vaping Use   Vaping Use: Never used  Substance Use Topics   Alcohol use: No   Drug use: No    Allergies: No Known Allergies  Medications Prior to Admission  Medication Sig Dispense Refill Last Dose   aspirin 81 MG chewable tablet Chew 2 tablets (162 mg total) by mouth daily. 60 tablet 7 02/03/2023   Prenat-Fe Poly-Methfol-FA-DHA (VITAFOL ULTRA) 29-0.6-0.4-200 MG CAPS Take 1 capsule by mouth daily. 30 capsule 12 02/03/2023   Ascorbic Acid (VITAMIN C PO) Take 1 tablet by mouth 2 (two) times a week. (Patient not taking: Reported on 01/30/2023)      pantoprazole (PROTONIX) 40 MG tablet Take 1 tablet (40 mg total) by mouth daily. (Patient not taking: Reported on 01/30/2023) 30 tablet 1    polyethylene glycol powder (GLYCOLAX/MIRALAX) 17 GM/SCOOP powder Take 1 Container by mouth once.  Probiotic Product (PROBIOTIC PO) Take 1 capsule by mouth in the morning. [employer] Dr. Gillermina Phy Women's Probiotics      progesterone (PROMETRIUM) 200 MG capsule Take 1 capsule (200 mg total) by mouth daily. (Patient not taking: Reported on 01/30/2023) 30 capsule 6     Review of Systems  Constitutional:  Negative for chills, fatigue and fever.  Eyes:  Negative for pain and visual disturbance.  Respiratory:  Negative for apnea, shortness of breath and wheezing.   Cardiovascular:  Negative for chest pain and palpitations.  Gastrointestinal:  Positive for abdominal pain. Negative for constipation, diarrhea, nausea and vomiting.  Genitourinary:  Negative for difficulty urinating, dysuria, pelvic pain, vaginal bleeding, vaginal discharge and vaginal pain.  Musculoskeletal:  Negative for back pain.  Neurological:  Negative for seizures, weakness and headaches.   Psychiatric/Behavioral:  Negative for suicidal ideas.    Physical Exam   Blood pressure (!) 145/75, pulse 90, temperature 98.4 F (36.9 C), temperature source Oral, resp. rate 19, height 5\' 10"  (1.778 m), weight 121.5 kg, last menstrual period 06/21/2022, SpO2 99 %, unknown if currently breastfeeding.  Physical Exam Vitals and nursing note reviewed.  Constitutional:      General: She is not in acute distress.    Appearance: Normal appearance.  HENT:     Head: Normocephalic.  Pulmonary:     Effort: Pulmonary effort is normal.  Abdominal:     Palpations: Abdomen is soft.     Tenderness: There is abdominal tenderness in the right upper quadrant and left lower quadrant. There is no right CVA tenderness, left CVA tenderness, guarding or rebound.     Comments: Pregnant, fetal movement palpated.   Musculoskeletal:     Cervical back: Normal range of motion.  Skin:    General: Skin is warm and dry.  Neurological:     Mental Status: She is alert and oriented to person, place, and time.  Psychiatric:        Mood and Affect: Mood normal.    FHT: 135bpm with moderate variability accels present, no decels noted.  - Toco: UI noted, no contractions   MAU Course  Procedures Orders Placed This Encounter  Procedures   US Abdomen Limited RUQ (LIVER/GB)   Urinalysis, Routine w reflex microscopic -Urine, Clean Catch   Comprehensive metabolic panel   CBC with Differential/Platelet   Protein / creatinine ratio, urine   Amylase   Lipase, blood   Results for orders placed or performed during the hospital encounter of 02/03/23 (from the past 24 hour(s))  Urinalysis, Routine w reflex microscopic -Urine, Clean Catch     Status: Abnormal   Collection Time: 02/04/23 12:11 AM  Result Value Ref Range   Color, Urine STRAW (A) YELLOW   APPearance CLEAR CLEAR   Specific Gravity, Urine 1.005 1.005 - 1.030   pH 6.0 5.0 - 8.0   Glucose, UA NEGATIVE NEGATIVE mg/dL   Hgb urine dipstick SMALL (A)  NEGATIVE   Bilirubin Urine NEGATIVE NEGATIVE   Ketones, ur NEGATIVE NEGATIVE mg/dL   Protein, ur NEGATIVE NEGATIVE mg/dL   Nitrite NEGATIVE NEGATIVE   Leukocytes,Ua NEGATIVE NEGATIVE   RBC / HPF 0-5 0 - 5 RBC/hpf   WBC, UA 0-5 0 - 5 WBC/hpf   Bacteria, UA RARE (A) NONE SEEN   Squamous Epithelial / HPF 0-5 0 - 5 /HPF  Protein / creatinine ratio, urine     Status: None   Collection Time: 02/04/23 12:11 AM  Result Value Ref Range  Creatinine, Urine 25 mg/dL   Total Protein, Urine <6 mg/dL   Protein Creatinine Ratio        0.00 - 0.15 mg/mg[Cre]  Comprehensive metabolic panel     Status: Abnormal   Collection Time: 02/04/23  1:14 AM  Result Value Ref Range   Sodium 136 135 - 145 mmol/L   Potassium 3.7 3.5 - 5.1 mmol/L   Chloride 107 98 - 111 mmol/L   CO2 21 (L) 22 - 32 mmol/L   Glucose, Bld 95 70 - 99 mg/dL   BUN 7 6 - 20 mg/dL   Creatinine, Ser 1.19 (L) 0.44 - 1.00 mg/dL   Calcium 9.4 8.9 - 14.7 mg/dL   Total Protein 5.7 (L) 6.5 - 8.1 g/dL   Albumin 2.7 (L) 3.5 - 5.0 g/dL   AST 16 15 - 41 U/L   ALT 19 0 - 44 U/L   Alkaline Phosphatase 68 38 - 126 U/L   Total Bilirubin 0.4 0.3 - 1.2 mg/dL   GFR, Estimated >82 >95 mL/min   Anion gap 8 5 - 15  CBC with Differential/Platelet     Status: Abnormal   Collection Time: 02/04/23  1:14 AM  Result Value Ref Range   WBC 7.6 4.0 - 10.5 K/uL   RBC 4.35 3.87 - 5.11 MIL/uL   Hemoglobin 10.6 (L) 12.0 - 15.0 g/dL   HCT 62.1 (L) 30.8 - 65.7 %   MCV 77.2 (L) 80.0 - 100.0 fL   MCH 24.4 (L) 26.0 - 34.0 pg   MCHC 31.5 30.0 - 36.0 g/dL   RDW 84.6 (H) 96.2 - 95.2 %   Platelets 205 150 - 400 K/uL   nRBC 0.0 0.0 - 0.2 %   Neutrophils Relative % 60 %   Neutro Abs 4.6 1.7 - 7.7 K/uL   Lymphocytes Relative 23 %   Lymphs Abs 1.8 0.7 - 4.0 K/uL   Monocytes Relative 10 %   Monocytes Absolute 0.8 0.1 - 1.0 K/uL   Eosinophils Relative 2 %   Eosinophils Absolute 0.1 0.0 - 0.5 K/uL   Basophils Relative 1 %   Basophils Absolute 0.0 0.0 - 0.1 K/uL    Immature Granulocytes 4 %   Abs Immature Granulocytes 0.29 (H) 0.00 - 0.07 K/uL  Amylase     Status: None   Collection Time: 02/04/23  1:14 AM  Result Value Ref Range   Amylase 73 28 - 100 U/L  Lipase, blood     Status: None   Collection Time: 02/04/23  1:14 AM  Result Value Ref Range   Lipase 33 11 - 51 U/L   US Abdomen Limited RUQ (LIVER/GB)  Result Date: 02/04/2023 CLINICAL DATA:  Right upper quadrant abdominal pain EXAM: ULTRASOUND ABDOMEN LIMITED RIGHT UPPER QUADRANT COMPARISON:  None Available. FINDINGS: Gallbladder: No gallstones or wall thickening visualized. No sonographic Murphy sign noted by sonographer. Common bile duct: Diameter: 2 mm Liver: No focal lesion identified. Within normal limits in parenchymal echogenicity. Portal vein is patent on color Doppler imaging with normal direction of blood flow towards the liver. Other: None. IMPRESSION: Negative right upper quadrant ultrasound. Electronically Signed   By: Charline Bills M.D.   On: 02/04/2023 02:48     MDM - CNM offered pain medication.  - Amylase and Lipase normal  - PreE labs normal  - RUQ [place] normal. Low suspicion for complications with gallbladder or liver.  - no contractions noted on toco.  - plan for discharge.   Assessment and Plan  1. Periumbilical abdominal pain   2. [redacted] weeks gestation of pregnancy   3. Physically well but worried    - Reviewed that pain could be caused by fetal movement, abdominal stretching etc.  - Reviewed comfort measures that are safe in pregnancy that patient may do at home to relieve discomfort.  - Worsening signs and return precautions reviewed.  - FHT appropriate for gestational age at time of discharge.  - Patient discharged home in stable condition and may return to MAU as needed.   Claudette Head, MSN  CNM  02/04/2023, 12:39 AM

## 2023-02-05 ENCOUNTER — Ambulatory Visit: Payer: Medicaid Other | Attending: Obstetrics & Gynecology

## 2023-02-05 ENCOUNTER — Ambulatory Visit: Payer: Medicaid Other | Admitting: *Deleted

## 2023-02-05 VITALS — BP 118/77 | HR 87

## 2023-02-05 DIAGNOSIS — O3433 Maternal care for cervical incompetence, third trimester: Secondary | ICD-10-CM

## 2023-02-05 DIAGNOSIS — O26893 Other specified pregnancy related conditions, third trimester: Secondary | ICD-10-CM | POA: Diagnosis not present

## 2023-02-05 DIAGNOSIS — O0993 Supervision of high risk pregnancy, unspecified, third trimester: Secondary | ICD-10-CM | POA: Insufficient documentation

## 2023-02-05 DIAGNOSIS — O09523 Supervision of elderly multigravida, third trimester: Secondary | ICD-10-CM | POA: Diagnosis not present

## 2023-02-05 DIAGNOSIS — Z3A32 32 weeks gestation of pregnancy: Secondary | ICD-10-CM

## 2023-02-05 DIAGNOSIS — O133 Gestational [pregnancy-induced] hypertension without significant proteinuria, third trimester: Secondary | ICD-10-CM | POA: Diagnosis not present

## 2023-02-05 DIAGNOSIS — O09293 Supervision of pregnancy with other poor reproductive or obstetric history, third trimester: Secondary | ICD-10-CM

## 2023-02-05 DIAGNOSIS — O09213 Supervision of pregnancy with history of pre-term labor, third trimester: Secondary | ICD-10-CM | POA: Diagnosis not present

## 2023-02-05 LAB — CULTURE, OB URINE: Culture: NO GROWTH

## 2023-02-06 ENCOUNTER — Telehealth: Payer: Self-pay | Admitting: Obstetrics & Gynecology

## 2023-02-06 NOTE — Telephone Encounter (Signed)
Orders placed.

## 2023-02-06 NOTE — Telephone Encounter (Signed)
Pt states she needs to have all lab orders put in.

## 2023-02-08 ENCOUNTER — Inpatient Hospital Stay (HOSPITAL_COMMUNITY)
Admission: AD | Admit: 2023-02-08 | Discharge: 2023-02-08 | Disposition: A | Discharge status: Home / Self Care | Payer: Medicaid Other | Attending: Obstetrics and Gynecology | Admitting: Obstetrics and Gynecology

## 2023-02-08 ENCOUNTER — Other Ambulatory Visit: Payer: Medicaid Other

## 2023-02-08 ENCOUNTER — Encounter (HOSPITAL_COMMUNITY): Payer: Self-pay | Admitting: Obstetrics and Gynecology

## 2023-02-08 DIAGNOSIS — O3433 Maternal care for cervical incompetence, third trimester: Secondary | ICD-10-CM | POA: Diagnosis not present

## 2023-02-08 DIAGNOSIS — Z3A33 33 weeks gestation of pregnancy: Secondary | ICD-10-CM | POA: Diagnosis not present

## 2023-02-08 DIAGNOSIS — O368130 Decreased fetal movements, third trimester, not applicable or unspecified: Secondary | ICD-10-CM | POA: Insufficient documentation

## 2023-02-08 DIAGNOSIS — O133 Gestational [pregnancy-induced] hypertension without significant proteinuria, third trimester: Secondary | ICD-10-CM

## 2023-02-08 DIAGNOSIS — O368131 Decreased fetal movements, third trimester, fetus 1: Secondary | ICD-10-CM | POA: Diagnosis not present

## 2023-02-08 LAB — URINALYSIS, ROUTINE W REFLEX MICROSCOPIC
Bilirubin Urine: NEGATIVE
Glucose, UA: NEGATIVE mg/dL
Hgb urine dipstick: NEGATIVE
Ketones, ur: NEGATIVE mg/dL
Leukocytes,Ua: NEGATIVE
Nitrite: NEGATIVE
Protein, ur: NEGATIVE mg/dL
Specific Gravity, Urine: 1.003 — ABNORMAL LOW (ref 1.005–1.030)
pH: 6 (ref 5.0–8.0)

## 2023-02-08 NOTE — MAU Provider Note (Signed)
Chief Complaint:  Decreased Fetal Movement and Abdominal Pain   Event Date/Time   First Provider Initiated Contact with Patient 02/08/23 2145      HPI: Rebecca Webster is a 42 y.o. [id number] at [redacted]w[redacted]d by LMP who presents to maternity admissions reporting decreased fetal movement all day today.  She is feeling fetal movement upon arrival in MAU.  She denies regular contractions, LOF, vaginal bleeding, vaginal itching/burning, urinary symptoms, h/a, dizziness, n/v, or fever/chills.      HPI  Past Medical History: Past Medical History:  Diagnosis Date   Anemia    not currently anemic   Beta thalassemia trait    Gestational HTN    Incompetent cervix    Vulvovaginal candidiasis 01/23/2023    Past obstetric history: OB History  Gravida Para Term Preterm AB Living  10 5 3 2 4 6   SAB IAB Ectopic Multiple Live Births  3   1 1 6     # Outcome Date GA Lbr Len/2nd Weight Sex Delivery Anes PTL Lv  10 Current           9 SAB 04/2022          8 SAB 03/04/21 [redacted]w[redacted]d         7 Preterm 03/02/20 [redacted]w[redacted]d  2977 g F Vag-Spont  N LIV     Birth Comments: cerglage placed     Complications: Gestational hypertension  6 SAB 05/23/19 [redacted]w[redacted]d   M   N      Birth Comments: incompetent cervix     Complications: Preterm premature rupture of membranes  5 Ectopic 2019     ECTOPIC     4 Term 09/11/03 [redacted]w[redacted]d  3147 g M VBAC EPI N LIV  3A Preterm 07/18/00 [redacted]w[redacted]d  1786 g F CS-LTranv Spinal Y LIV     Complications: Breech presentation  3B Preterm 07/18/00 [redacted]w[redacted]d  1956 g M CS-LTranv Spinal Y LIV     Complications: Breech presentation  2 Term 07/06/98 [redacted]w[redacted]d  3629 g F Vag-Spont EPI N LIV  1 Term 05/11/96 [redacted]w[redacted]d  3771 g M Vag-Spont EPI N LIV    Past Surgical History: Past Surgical History:  Procedure Laterality Date   ABDOMINOPLASTY     CERVICAL CERCLAGE N/A 05/06/2019   Procedure: CERCLAGE CERVICAL;  Surgeon: Maxie Better, MD;  Location: [facility];  Service: Gynecology;  Laterality: N/A;   CERVICAL CERCLAGE  N/A 09/29/2019   Procedure: CERCLAGE CERVICAL;  Surgeon: Maxie Better, MD;  Location: [facility];  Service: Gynecology;  Laterality: N/A;  EDD: 03/28/20   CERVICAL CERCLAGE N/A 09/22/2022   Procedure: CERCLAGE CERVICAL;  Surgeon: Lennart Pall, MD;  Location: [facility];  Service: Gynecology;  Laterality: N/A;   CESAREAN SECTION  2001   Reversal of tubal ligation     TUBAL LIGATION     WISDOM TOOTH EXTRACTION      Family History: Family History  Problem Relation Age of Onset   Hypertension Mother    Cancer Father        prostate   Heart disease Maternal Grandmother    Stroke Maternal Grandmother    Diabetes Neg Hx    Asthma Neg Hx     Social History: Social History   Tobacco Use   Smoking status: Never   Smokeless tobacco: Never  Vaping Use   Vaping Use: Never used  Substance Use Topics   Alcohol use: No   Drug use: No    Allergies: No Known Allergies  Meds:  Medications Prior to Admission  Medication Sig Dispense Refill Last Dose   aspirin 81 MG chewable tablet Chew 2 tablets (162 mg total) by mouth daily. 60 tablet 7 02/07/2023   Prenat-Fe Poly-Methfol-FA-DHA (VITAFOL ULTRA) 29-0.6-0.4-200 MG CAPS Take 1 capsule by mouth daily. 30 capsule 12 02/08/2023   polyethylene glycol powder (GLYCOLAX/MIRALAX) 17 GM/SCOOP powder Take 1 Container by mouth once.      Probiotic Product (PROBIOTIC PO) Take 1 capsule by mouth in the morning. [employer] Dr. Gillermina Phy Women's Probiotics       ROS:  Review of Systems  Constitutional:  Negative for chills, fatigue and fever.  Eyes:  Negative for visual disturbance.  Respiratory:  Negative for shortness of breath.   Cardiovascular:  Negative for chest pain.  Gastrointestinal:  Negative for abdominal pain, nausea and vomiting.  Genitourinary:  Negative for difficulty urinating, dysuria, flank pain, pelvic pain, vaginal bleeding, vaginal discharge and vaginal pain.  Neurological:  Negative for dizziness and headaches.   Psychiatric/Behavioral: Negative.       I have reviewed patient's Past Medical Hx, Surgical Hx, Family Hx, Social Hx, medications and allergies.   Physical Exam  Patient Vitals for the past 24 hrs:  BP Temp Temp src Pulse Resp SpO2 Height Weight  02/08/23 2216 132/77 -- -- 86 -- -- -- --  02/08/23 2200 128/77 -- -- 88 -- 97 % -- --  02/08/23 2130 127/79 -- -- 86 -- 97 % -- --  02/08/23 2100 127/74 -- -- 91 -- 98 % -- --  02/08/23 2050 (!) 140/82 -- -- 91 -- 100 % -- --  02/08/23 1948 133/81 98.7 F (37.1 C) Oral 87 16 100 % 5\' 10"  (1.778 m) 121.5 kg   Constitutional: Well-developed, well-nourished female in no acute distress.  Cardiovascular: normal rate Respiratory: normal effort GI: Abd soft, non-tender, gravid appropriate for gestational age.  MS: Extremities nontender, no edema, normal ROM Neurologic: Alert and oriented x 4.  GU: Neg CVAT.  PELVIC EXAM: deferred     FHT:  Baseline 140 , moderate variability, accelerations present, no decelerations Contractions: none on toco or to palpation   Labs: Results for orders placed or performed during the hospital encounter of 02/08/23 (from the past 24 hour(s))  Urinalysis, Routine w reflex microscopic -Urine, Clean Catch     Status: Abnormal   Collection Time: 02/08/23  8:01 PM  Result Value Ref Range   Color, Urine STRAW (A) YELLOW   APPearance CLEAR CLEAR   Specific Gravity, Urine 1.003 (L) 1.005 - 1.030   pH 6.0 5.0 - 8.0   Glucose, UA NEGATIVE NEGATIVE mg/dL   Hgb urine dipstick NEGATIVE NEGATIVE   Bilirubin Urine NEGATIVE NEGATIVE   Ketones, ur NEGATIVE NEGATIVE mg/dL   Protein, ur NEGATIVE NEGATIVE mg/dL   Nitrite NEGATIVE NEGATIVE   Leukocytes,Ua NEGATIVE NEGATIVE   --/--/O POS (06/19 1008)  Imaging:   MAU Course/MDM: Orders Placed This Encounter  Procedures   Urinalysis, Routine w reflex microscopic -Urine, Clean Catch   Discharge patient    No orders of the defined types were placed in this  encounter.    NST reviewed and reactive Pt feeling normal movement in MAU Reviewed fetal kick monitoring at home, reassurance provided D/C home with PTL precautions Keep scheduled appts at [facility]  Assessment: 1. Decreased fetal movements in third trimester, fetus 1 of multiple gestation   2. [redacted] weeks gestation of pregnancy   3. Cervical cerclage suture present in third trimester  Plan: Discharge home Labor precautions and fetal kick counts  Follow-up Information     [facility] for [facility] Follow up on 02/13/2023.   Specialty: Obstetrics and Gynecology Why: As scheduled Contact information: [address] [place] [address] [phone]        [facility] Follow up.   Specialty: Obstetrics and Gynecology Why: As needed for emergencies Contact information: [address] [id number] [place] [address] [phone]               Allergies as of 02/08/2023   No Known Allergies      Medication List     TAKE these medications    aspirin 81 MG chewable tablet Chew 2 tablets (162 mg total) by mouth daily.   polyethylene glycol powder 17 GM/SCOOP powder Commonly known as: GLYCOLAX/MIRALAX Take 1 Container by mouth once.   PROBIOTIC PO Take 1 capsule by mouth in the morning. [employer] Dr. Gillermina Phy Women's Probiotics   Vitafol Ultra 29-0.6-0.4-200 MG Caps Take 1 capsule by mouth daily.        Kemberly Kessenich Certified Nurse-Midwife 02/08/2023 10:39 PM

## 2023-02-08 NOTE — MAU Note (Signed)
..  Rebecca Webster is a 42 y.o. at [redacted]w[redacted]d here in MAU reporting: decreased fetal movement, reports some movements but are more faint, last movement was 20 minutes ago.  Reports upper abdominal pain that comes and goes for the past week, none now.  Denies vaginal bleeding or leaking of fluid.  Onset of complaint: this morning Pain score: 0/10 Vitals:   02/08/23 1948  BP: 133/81  Pulse: 87  Resp: 16  Temp: 98.7 F (37.1 C)  SpO2: 100%     FHT:140 Lab orders placed from triage:  UA

## 2023-02-12 ENCOUNTER — Telehealth: Payer: Self-pay | Admitting: *Deleted

## 2023-02-12 ENCOUNTER — Encounter: Payer: Self-pay | Admitting: Obstetrics & Gynecology

## 2023-02-12 NOTE — Telephone Encounter (Signed)
Called patient regarding mychart message. Patient denies any bleeding, leaking and heaviness in lower abdomin when she stands.  Discussed with Dr Despina Hidden and patient advised she can take Procardia if she feels she needs it, however if she has any bleeding, leaking or more than 6 contractions in an hour, then should go to MAU.  Advised she could also wear maternity band to help with abdominal support. Pt verbalized understanding with no further questions.

## 2023-02-13 ENCOUNTER — Ambulatory Visit: Payer: Medicaid Other | Attending: Obstetrics & Gynecology

## 2023-02-13 ENCOUNTER — Ambulatory Visit: Payer: Medicaid Other | Admitting: *Deleted

## 2023-02-13 VITALS — BP 129/74 | HR 102

## 2023-02-13 DIAGNOSIS — O09523 Supervision of elderly multigravida, third trimester: Secondary | ICD-10-CM

## 2023-02-13 DIAGNOSIS — O0993 Supervision of high risk pregnancy, unspecified, third trimester: Secondary | ICD-10-CM | POA: Insufficient documentation

## 2023-02-13 DIAGNOSIS — O0943 Supervision of pregnancy with grand multiparity, third trimester: Secondary | ICD-10-CM

## 2023-02-13 DIAGNOSIS — Z3A33 33 weeks gestation of pregnancy: Secondary | ICD-10-CM | POA: Diagnosis not present

## 2023-02-13 DIAGNOSIS — O3433 Maternal care for cervical incompetence, third trimester: Secondary | ICD-10-CM | POA: Diagnosis not present

## 2023-02-13 DIAGNOSIS — O133 Gestational [pregnancy-induced] hypertension without significant proteinuria, third trimester: Secondary | ICD-10-CM | POA: Diagnosis not present

## 2023-02-13 DIAGNOSIS — O09213 Supervision of pregnancy with history of pre-term labor, third trimester: Secondary | ICD-10-CM | POA: Diagnosis not present

## 2023-02-15 ENCOUNTER — Other Ambulatory Visit: Payer: Medicaid Other

## 2023-02-15 ENCOUNTER — Ambulatory Visit (INDEPENDENT_AMBULATORY_CARE_PROVIDER_SITE_OTHER): Payer: Medicaid Other | Admitting: Obstetrics & Gynecology

## 2023-02-15 ENCOUNTER — Encounter: Payer: Self-pay | Admitting: Obstetrics & Gynecology

## 2023-02-15 VITALS — BP 127/78 | HR 94 | Wt 269.0 lb

## 2023-02-15 DIAGNOSIS — O133 Gestational [pregnancy-induced] hypertension without significant proteinuria, third trimester: Secondary | ICD-10-CM | POA: Diagnosis not present

## 2023-02-15 DIAGNOSIS — O3433 Maternal care for cervical incompetence, third trimester: Secondary | ICD-10-CM

## 2023-02-15 DIAGNOSIS — O0993 Supervision of high risk pregnancy, unspecified, third trimester: Secondary | ICD-10-CM | POA: Diagnosis not present

## 2023-02-15 DIAGNOSIS — Z3A34 34 weeks gestation of pregnancy: Secondary | ICD-10-CM | POA: Diagnosis not present

## 2023-02-15 NOTE — Progress Notes (Signed)
HIGH-RISK PREGNANCY VISIT Patient name: Rebecca Webster MRN [id number]  Date of birth: 01-02-1981 Chief Complaint:   Non-stress Test and Routine Prenatal Visit  History of Present Illness:   Rebecca Webster is a 42 y.o. [id number] female at [redacted]w[redacted]d with an Estimated Date of Delivery: 03/28/23 being seen today for ongoing management of a high-risk pregnancy complicated by cervical insufficiency with cerclage in place, had ?PROM earlier in pregnancy with reseal, gHTN? Improved BP now.    Today she reports no complaints. Contractions: Irritability. Vag. Bleeding: None.  Movement: Present. denies leaking of fluid.      09/12/2022    1:42 PM 04/27/2022    8:41 AM  Depression screen PHQ 2/9  Decreased Interest 0 0  Down, Depressed, Hopeless 0 0  PHQ - 2 Score 0 0  Altered sleeping 2   Tired, decreased energy 1   Change in appetite 0   Feeling bad or failure about yourself  0   Trouble concentrating 0   Moving slowly or fidgety/restless 0   Suicidal thoughts 0   PHQ-9 Score 3         09/12/2022    1:43 PM  GAD 7 : Generalized Anxiety Score  Nervous, Anxious, on Edge 1  Control/stop worrying 0  Worry too much - different things 0  Trouble relaxing 0  Restless 0  Easily annoyed or irritable 0  Afraid - awful might happen 0  Total GAD 7 Score 1     Review of Systems:   Pertinent items are noted in HPI Denies abnormal vaginal discharge w/ itching/odor/irritation, headaches, visual changes, shortness of breath, chest pain, abdominal pain, severe nausea/vomiting, or problems with urination or bowel movements unless otherwise stated above. Pertinent History Reviewed:  Reviewed past medical,surgical, social, obstetrical and family history.  Reviewed problem list, medications and allergies. Physical Assessment:   Vitals:   02/15/23 0837  BP: 127/78  Pulse: 94  Weight: 269 lb (122 kg)  Body mass index is 38.6 kg/m.           Physical Examination:   General appearance: alert,  well appearing, and in no distress  Mental status: alert, oriented to person, place, and time  Skin: warm & dry   Extremities: Edema: Trace    Cardiovascular: normal heart rate noted  Respiratory: normal respiratory effort, no distress  Abdomen: gravid, soft, non-tender  Pelvic: Cervical exam deferred         Fetal Status:     Movement: Present    Fetal Surveillance Testing today: reactive NST  Rebecca Webster is at [redacted]w[redacted]d Estimated Date of Delivery: 03/28/23  NST being performed due to ?P/PROM with reseal  Today the NST is Reactive  Fetal Monitoring:  Baseline: 140s bpm, Variability: Good {> 6 bpm), Accelerations: Reactive, and Decelerations: Absent   reactive  The accelerations are >15 bpm and more than 2 in 20 minutes  Final diagnosis:  Reactive NST  Lazaro Arms, MD     Chaperone: N/A    No results found for this or any previous visit (from the past 24 hour(s)).  Assessment & Plan:  High-risk pregnancy: [id number] at [redacted]w[redacted]d with an Estimated Date of Delivery: 03/28/23      ICD-10-CM   1. Supervision of high risk pregnancy in third trimester  O09.93     2. Cervical cerclage suture present in third trimester  O34.33     3. Gestational hypertension, third trimester  O13.3  Meds: No orders of the defined types were placed in this encounter.   Orders: No orders of the defined types were placed in this encounter.    Labs/procedures today: NST  Treatment Plan:  IOL 37 weeks, twice weekly surveillance with NST alt BPP    Follow-up: No follow-ups on file.   Future Appointments  Date Time Provider Department Center  02/20/2023 11:30 AM [facility] [place] [facility] None  02/20/2023  1:30 PM Lazaro Arms, MD [facility]  02/22/2023  8:45 AM [facility]  02/27/2023 11:00 AM [facility] [place] [facility] None  02/27/2023 11:50 AM Myna Hidalgo, DO [facility]  03/01/2023  8:30 AM [facility]  03/06/2023  2:15 PM [facility]  [facility] [place] [facility] None  03/06/2023  3:10 PM Myna Hidalgo, DO [facility]  03/08/2023  8:30 AM [facility] LAB [facility]  03/13/2023  2:15 PM [facility] [place] [facility] None  03/13/2023  3:10 PM Myna Hidalgo, DO [facility]    No orders of the defined types were placed in this encounter.  Lazaro Arms  Attending Physician for the Center for [facility] 02/15/2023 9:19 AM

## 2023-02-16 LAB — COMPREHENSIVE METABOLIC PANEL
ALT: 21 IU/L (ref 0–32)
AST: 16 IU/L (ref 0–40)
Albumin: 3.6 g/dL — ABNORMAL LOW (ref 3.9–4.9)
Alkaline Phosphatase: 97 IU/L (ref 44–121)
BUN/Creatinine Ratio: 10 (ref 9–23)
BUN: 5 mg/dL — ABNORMAL LOW (ref 6–24)
Bilirubin Total: <0.2 mg/dL (ref 0.0–1.2)
CO2: 20 mmol/L (ref 20–29)
Calcium: 9.5 mg/dL (ref 8.7–10.2)
Chloride: 102 mmol/L (ref 96–106)
Creatinine, Ser: 0.48 mg/dL — ABNORMAL LOW (ref 0.57–1.00)
Globulin, Total: 2.4 g/dL (ref 1.5–4.5)
Glucose: 95 mg/dL (ref 70–99)
Potassium: 4.2 mmol/L (ref 3.5–5.2)
Sodium: 138 mmol/L (ref 134–144)
Total Protein: 6 g/dL (ref 6.0–8.5)
eGFR: 122 mL/min/{1.73_m2} (ref >59)

## 2023-02-16 LAB — CBC
Hematocrit: 35.5 % (ref 34.0–46.6)
Hemoglobin: 11.4 g/dL (ref 11.1–15.9)
MCH: 24.3 pg — ABNORMAL LOW (ref 26.6–33.0)
MCHC: 32.1 g/dL (ref 31.5–35.7)
MCV: 76 fL — ABNORMAL LOW (ref 79–97)
Platelets: 231 10*3/uL (ref 150–450)
RBC: 4.7 x10E6/uL (ref 3.77–5.28)
RDW: 17.1 % — ABNORMAL HIGH (ref 11.7–15.4)
WBC: 7.7 10*3/uL (ref 3.4–10.8)

## 2023-02-16 LAB — PROTEIN / CREATININE RATIO, URINE
Creatinine, Urine: 40.8 mg/dL
Protein, Ur: 5 mg/dL
Protein/Creat Ratio: 123 mg/g creat (ref 0–200)

## 2023-02-19 ENCOUNTER — Other Ambulatory Visit: Payer: Self-pay | Admitting: Obstetrics & Gynecology

## 2023-02-19 DIAGNOSIS — O09523 Supervision of elderly multigravida, third trimester: Secondary | ICD-10-CM

## 2023-02-19 DIAGNOSIS — N883 Incompetence of cervix uteri: Secondary | ICD-10-CM

## 2023-02-20 ENCOUNTER — Ambulatory Visit: Payer: Medicaid Other | Admitting: Obstetrics & Gynecology

## 2023-02-20 ENCOUNTER — Encounter: Payer: Self-pay | Admitting: Obstetrics & Gynecology

## 2023-02-20 ENCOUNTER — Ambulatory Visit (INDEPENDENT_AMBULATORY_CARE_PROVIDER_SITE_OTHER): Payer: Medicaid Other

## 2023-02-20 VITALS — BP 135/83 | HR 100 | Wt 273.0 lb

## 2023-02-20 DIAGNOSIS — O42913 Preterm premature rupture of membranes, unspecified as to length of time between rupture and onset of labor, third trimester: Secondary | ICD-10-CM

## 2023-02-20 DIAGNOSIS — N883 Incompetence of cervix uteri: Secondary | ICD-10-CM | POA: Diagnosis not present

## 2023-02-20 DIAGNOSIS — O09523 Supervision of elderly multigravida, third trimester: Secondary | ICD-10-CM | POA: Diagnosis not present

## 2023-02-20 DIAGNOSIS — O42919 Preterm premature rupture of membranes, unspecified as to length of time between rupture and onset of labor, unspecified trimester: Secondary | ICD-10-CM

## 2023-02-20 DIAGNOSIS — Z3A34 34 weeks gestation of pregnancy: Secondary | ICD-10-CM

## 2023-02-20 DIAGNOSIS — O0993 Supervision of high risk pregnancy, unspecified, third trimester: Secondary | ICD-10-CM

## 2023-02-20 DIAGNOSIS — O3433 Maternal care for cervical incompetence, third trimester: Secondary | ICD-10-CM

## 2023-02-20 DIAGNOSIS — O133 Gestational [pregnancy-induced] hypertension without significant proteinuria, third trimester: Secondary | ICD-10-CM

## 2023-02-20 NOTE — Progress Notes (Signed)
HIGH-RISK PREGNANCY VISIT Patient name: Rebecca Webster MRN [id number]  Date of birth: 03/19/1981 Chief Complaint:   Routine Prenatal Visit  History of Present Illness:   Rebecca Webster is a 42 y.o. [id number] female at [redacted]w[redacted]d with an Estimated Date of Delivery: 03/28/23 being seen today for ongoing management of a high-risk pregnancy complicated by [place] cerclage, Hx of P/PROM this pregnancy with reseal, ?gHTN.    Today she reports new onset palpitations. Contractions: Irritability. Vag. Bleeding: None.  Movement: Present. denies leaking of fluid.      09/12/2022    1:42 PM 04/27/2022    8:41 AM  Depression screen PHQ 2/9  Decreased Interest 0 0  Down, Depressed, Hopeless 0 0  PHQ - 2 Score 0 0  Altered sleeping 2   Tired, decreased energy 1   Change in appetite 0   Feeling bad or failure about yourself  0   Trouble concentrating 0   Moving slowly or fidgety/restless 0   Suicidal thoughts 0   PHQ-9 Score 3         09/12/2022    1:43 PM  GAD 7 : Generalized Anxiety Score  Nervous, Anxious, on Edge 1  Control/stop worrying 0  Worry too much - different things 0  Trouble relaxing 0  Restless 0  Easily annoyed or irritable 0  Afraid - awful might happen 0  Total GAD 7 Score 1     Review of Systems:   Pertinent items are noted in HPI Denies abnormal vaginal discharge w/ itching/odor/irritation, headaches, visual changes, shortness of breath, chest pain, abdominal pain, severe nausea/vomiting, or problems with urination or bowel movements unless otherwise stated above. Pertinent History Reviewed:  Reviewed past medical,surgical, social, obstetrical and family history.  Reviewed problem list, medications and allergies. Physical Assessment:   Vitals:   02/20/23 1157  BP: 135/83  Pulse: 100  Weight: 273 lb (123.8 kg)  Body mass index is 39.17 kg/m.           Physical Examination:   General appearance: alert, well appearing, and in no distress  Mental status:  alert, oriented to person, place, and time  Skin: warm & dry   Extremities:      Cardiovascular: normal heart rate noted  Respiratory: normal respiratory effort, no distress  Abdomen: gravid, soft, non-tender  Pelvic: Cervical exam deferred         Fetal Status:     Movement: Present    Fetal Surveillance Testing today: BPP 8/8 EFW 49% with AFI 14 cm   Chaperone: N/A    No results found for this or any previous visit (from the past 24 hour(s)).  Assessment & Plan:  High-risk pregnancy: [id number] at [redacted]w[redacted]d with an Estimated Date of Delivery: 03/28/23      ICD-10-CM   1. Supervision of high risk pregnancy in third trimester  O09.93     2. Cervical cerclage suture present in third trimester  O34.33     3. Gestational hypertension, third trimester  O13.3     4. Preterm premature rupture of membranes, antepartum-->resealed?  O42.919          Meds: No orders of the defined types were placed in this encounter.   Orders: No orders of the defined types were placed in this encounter.    Labs/procedures today: U/S  Treatment Plan:  visit next week for sonogram then remove cerclage next Friday [redacted]w[redacted]d-->IOL 37 weeks(gHTN + hx of reseal)    Follow-up: Return for keep  scheduled + OBV next friday for cerclage removal.   Future Appointments  Date Time Provider Department Center  02/20/2023  1:30 PM Lazaro Arms, MD [facility]  02/22/2023  8:45 AM [facility]  02/27/2023 11:00 AM [facility] [place] [facility] None  02/27/2023 11:50 AM Myna Hidalgo, DO [facility]  03/01/2023  8:30 AM [facility]  03/06/2023  2:15 PM [facility] [place] [facility] None  03/06/2023  3:10 PM Myna Hidalgo, DO [facility]  03/08/2023  8:30 AM [facility]  03/13/2023  2:15 PM [facility] [place] [facility] None  03/13/2023  3:10 PM Myna Hidalgo, DO [facility]    No orders of the defined types were placed in this encounter.  Lazaro Arms   Attending Physician for the Center for [facility] 02/20/2023 12:05 PM

## 2023-02-20 NOTE — Progress Notes (Signed)
[place] 34+6 wks,cephalic,anterior placenta gr 2,AFI 14 cm,BPP 8/8,FHR 148 bpm,EFW 2558 g 49%,BPP 8/8

## 2023-02-22 ENCOUNTER — Other Ambulatory Visit: Payer: Medicaid Other

## 2023-02-22 DIAGNOSIS — O0993 Supervision of high risk pregnancy, unspecified, third trimester: Secondary | ICD-10-CM

## 2023-02-22 DIAGNOSIS — O133 Gestational [pregnancy-induced] hypertension without significant proteinuria, third trimester: Secondary | ICD-10-CM | POA: Diagnosis not present

## 2023-02-23 LAB — COMPREHENSIVE METABOLIC PANEL
ALT: 17 IU/L (ref 0–32)
AST: 17 IU/L (ref 0–40)
Albumin: 3.8 g/dL — ABNORMAL LOW (ref 3.9–4.9)
Alkaline Phosphatase: 98 IU/L (ref 44–121)
BUN/Creatinine Ratio: 11 (ref 9–23)
BUN: 5 mg/dL — ABNORMAL LOW (ref 6–24)
Bilirubin Total: <0.2 mg/dL (ref 0.0–1.2)
CO2: 18 mmol/L — ABNORMAL LOW (ref 20–29)
Calcium: 9.2 mg/dL (ref 8.7–10.2)
Chloride: 105 mmol/L (ref 96–106)
Creatinine, Ser: 0.45 mg/dL — ABNORMAL LOW (ref 0.57–1.00)
Globulin, Total: 2.2 g/dL (ref 1.5–4.5)
Glucose: 98 mg/dL (ref 70–99)
Potassium: 4.2 mmol/L (ref 3.5–5.2)
Sodium: 140 mmol/L (ref 134–144)
Total Protein: 6 g/dL (ref 6.0–8.5)
eGFR: 124 mL/min/{1.73_m2} (ref >59)

## 2023-02-23 LAB — CBC
Hematocrit: 34.2 % (ref 34.0–46.6)
Hemoglobin: 10.8 g/dL — ABNORMAL LOW (ref 11.1–15.9)
MCH: 23.8 pg — ABNORMAL LOW (ref 26.6–33.0)
MCHC: 31.6 g/dL (ref 31.5–35.7)
MCV: 76 fL — ABNORMAL LOW (ref 79–97)
Platelets: 225 10*3/uL (ref 150–450)
RBC: 4.53 x10E6/uL (ref 3.77–5.28)
RDW: 17.2 % — ABNORMAL HIGH (ref 11.7–15.4)
WBC: 7.3 10*3/uL (ref 3.4–10.8)

## 2023-02-26 ENCOUNTER — Other Ambulatory Visit: Payer: Self-pay | Admitting: Obstetrics & Gynecology

## 2023-02-26 DIAGNOSIS — O4290 Premature rupture of membranes, unspecified as to length of time between rupture and onset of labor, unspecified weeks of gestation: Secondary | ICD-10-CM

## 2023-02-26 DIAGNOSIS — O09523 Supervision of elderly multigravida, third trimester: Secondary | ICD-10-CM

## 2023-02-26 DIAGNOSIS — O3433 Maternal care for cervical incompetence, third trimester: Secondary | ICD-10-CM

## 2023-02-27 ENCOUNTER — Ambulatory Visit (INDEPENDENT_AMBULATORY_CARE_PROVIDER_SITE_OTHER): Payer: Medicaid Other | Admitting: Obstetrics & Gynecology

## 2023-02-27 ENCOUNTER — Ambulatory Visit (INDEPENDENT_AMBULATORY_CARE_PROVIDER_SITE_OTHER): Payer: Medicaid Other

## 2023-02-27 ENCOUNTER — Encounter: Payer: Self-pay | Admitting: Obstetrics & Gynecology

## 2023-02-27 ENCOUNTER — Other Ambulatory Visit (HOSPITAL_COMMUNITY)
Admission: RE | Admit: 2023-02-27 | Discharge: 2023-02-27 | Disposition: A | Discharge status: Home / Self Care | Payer: Medicaid Other | Source: Ambulatory Visit | Attending: Obstetrics & Gynecology | Admitting: Obstetrics & Gynecology

## 2023-02-27 VITALS — BP 134/84 | HR 91 | Wt 272.8 lb

## 2023-02-27 DIAGNOSIS — Z3A35 35 weeks gestation of pregnancy: Secondary | ICD-10-CM

## 2023-02-27 DIAGNOSIS — O133 Gestational [pregnancy-induced] hypertension without significant proteinuria, third trimester: Secondary | ICD-10-CM | POA: Diagnosis not present

## 2023-02-27 DIAGNOSIS — O3433 Maternal care for cervical incompetence, third trimester: Secondary | ICD-10-CM

## 2023-02-27 DIAGNOSIS — O09523 Supervision of elderly multigravida, third trimester: Secondary | ICD-10-CM | POA: Diagnosis not present

## 2023-02-27 DIAGNOSIS — O4290 Premature rupture of membranes, unspecified as to length of time between rupture and onset of labor, unspecified weeks of gestation: Secondary | ICD-10-CM

## 2023-02-27 NOTE — Progress Notes (Signed)
[place] 35+6 wks,cephalic,BPP 8/8,FHR 148 bpm,anterior placenta gr 2,AFI 17.6 cm

## 2023-02-27 NOTE — Progress Notes (Signed)
HIGH-RISK PREGNANCY VISIT Patient name: Rebecca Webster MRN [id number]  Date of birth: February 21, 1981 Chief Complaint:   Routine Prenatal Visit  History of Present Illness:   Rebecca Webster is a 42 y.o. [id number] female at [redacted]w[redacted]d with an Estimated Date of Delivery: 03/28/23 being seen today for ongoing management of a high-risk pregnancy complicated by:  -Gestational HTN -Cervical insuff.- cerclage in place -Prior C-section and h/o VBAC  Today she reports no complaints.   Contractions: Irritability. Vag. Bleeding: None.  Movement: Present. denies leaking of fluid.      09/12/2022    1:42 PM 04/27/2022    8:41 AM  Depression screen PHQ 2/9  Decreased Interest 0 0  Down, Depressed, Hopeless 0 0  PHQ - 2 Score 0 0  Altered sleeping 2   Tired, decreased energy 1   Change in appetite 0   Feeling bad or failure about yourself  0   Trouble concentrating 0   Moving slowly or fidgety/restless 0   Suicidal thoughts 0   PHQ-9 Score 3      Current Outpatient Medications  Medication Instructions   aspirin 162 mg, Oral, Daily   polyethylene glycol powder (GLYCOLAX/MIRALAX) 17 GM/SCOOP powder 1 Container,  Once   Prenat-Fe Poly-Methfol-FA-DHA (VITAFOL ULTRA) 29-0.6-0.4-200 MG CAPS 1 capsule, Oral, Daily   Probiotic Product (PROBIOTIC PO) 1 capsule, Every morning     Review of Systems:   Pertinent items are noted in HPI Denies abnormal vaginal discharge w/ itching/odor/irritation, headaches, visual changes, shortness of breath, chest pain, abdominal pain, severe nausea/vomiting, or problems with urination or bowel movements unless otherwise stated above. Pertinent History Reviewed:  Reviewed past medical,surgical, social, obstetrical and family history.  Reviewed problem list, medications and allergies. Physical Assessment:   Vitals:   02/27/23 1135  BP: 134/84  Pulse: 91  Weight: 272 lb 12.8 oz (123.7 kg)  Body mass index is 39.14 kg/m.           Physical Examination:    General appearance: alert, well appearing, and in no distress  Mental status: normal mood, behavior, speech, dress, motor activity, and thought processes  Skin: warm & dry   Extremities: Edema: None    Cardiovascular: normal heart rate noted  Respiratory: normal respiratory effort, no distress  Abdomen: gravid, soft, non-tender  Pelvic: Cervical exam performed  Stitch visualized and cut with scissors- Cerclage removed without difficulty  SVE: 2-3/50/-2, vertex       Fetal Status:     Movement: Present    Fetal Surveillance Testing today: cephalic,BPP 8/8,FHR 148 bpm,anterior placenta gr 2,AFI 17.6 cm    Chaperone: Faith Rogue    No results found for this or any previous visit (from the past 24 hour(s)).   Assessment & Plan:  High-risk pregnancy: [id number] at [redacted]w[redacted]d with an Estimated Date of Delivery: 03/28/23   1) Gestational HTN Continue antepartum testing Scheduled for IOL 03/08/2023  2) Cervical insuff -stitch removed today without complications -reviewed labor room precautions  Meds: No orders of the defined types were placed in this encounter.   Labs/procedures today: GBS, GC/C  Treatment Plan:  as outlined above, IOL scheduled/orders placed  Reviewed: Preterm labor symptoms and general obstetric precautions including but not limited to vaginal bleeding, contractions, leaking of fluid and fetal movement were reviewed in detail with the patient.  All questions were answered. Pt has home bp cuff. Check bp weekly, let us know if >140/90.   Follow-up: Return for cancel 7/26 appt, please change to NST.  Pt should have NST/BPP twice weekly.   Future Appointments  Date Time Provider Department Center  03/01/2023  8:30 AM [facility]  03/01/2023 10:10 AM [facility] NURSE [facility]  03/01/2023 10:30 AM Myna Hidalgo, DO [facility]  03/06/2023  2:15 PM [facility] [place] [facility] None  03/06/2023  3:10 PM Myna Hidalgo, DO [facility]  03/08/2023   7:00 AM [facility] SCHED ROOM [facility] None  03/08/2023  8:30 AM [facility]  03/08/2023  9:10 AM [facility] NURSE [facility]  03/08/2023  9:30 AM Lazaro Arms, MD [facility]  03/13/2023  2:15 PM [facility] [place] [facility] None  03/13/2023  3:10 PM Myna Hidalgo, DO [facility]    Orders Placed This Encounter  Procedures   Culture, beta strep (group b only)    Myna Hidalgo, DO Attending Obstetrician & Gynecologist, Faculty Practice Center for [employer], [facility]

## 2023-02-28 LAB — CERVICOVAGINAL ANCILLARY ONLY
Comment: NEGATIVE
Comment: NORMAL
Neisseria Gonorrhea: NEGATIVE

## 2023-03-01 ENCOUNTER — Encounter (HOSPITAL_COMMUNITY): Payer: Self-pay | Admitting: *Deleted

## 2023-03-01 ENCOUNTER — Encounter: Payer: Medicaid Other | Admitting: Obstetrics & Gynecology

## 2023-03-01 ENCOUNTER — Telehealth (HOSPITAL_COMMUNITY): Payer: Self-pay | Admitting: *Deleted

## 2023-03-01 ENCOUNTER — Ambulatory Visit: Payer: Medicaid Other | Admitting: *Deleted

## 2023-03-01 ENCOUNTER — Other Ambulatory Visit: Payer: Medicaid Other

## 2023-03-01 VITALS — BP 136/87 | HR 93

## 2023-03-01 DIAGNOSIS — Z3A36 36 weeks gestation of pregnancy: Secondary | ICD-10-CM

## 2023-03-01 DIAGNOSIS — O0993 Supervision of high risk pregnancy, unspecified, third trimester: Secondary | ICD-10-CM | POA: Diagnosis not present

## 2023-03-01 DIAGNOSIS — O133 Gestational [pregnancy-induced] hypertension without significant proteinuria, third trimester: Secondary | ICD-10-CM

## 2023-03-01 NOTE — Progress Notes (Signed)
   NURSE VISIT- NST  SUBJECTIVE:  Rebecca Webster is a 42 y.o. [id number] female at [redacted]w[redacted]d, here for a NST for pregnancy complicated by [facility].  She reports active fetal movement, contractions: none, vaginal bleeding: none, membranes: intact.   OBJECTIVE:  BP 136/87   Pulse 93   LMP 06/21/2022 (Exact Date)   Appears well, no apparent distress  No results found for this or any previous visit (from the past 24 hour(s)).  NST: FHR baseline 140 bpm, Variability: moderate, Accelerations:present, Decelerations:  Absent= Cat 1/reactive Toco: none   ASSESSMENT: [id number] at [redacted]w[redacted]d with GHTN NST reactive  PLAN: EFM strip reviewed by Dr. Charlotta Newton   Recommendations: keep next appointment as scheduled    Jobe Marker  03/01/2023 11:17 AM

## 2023-03-01 NOTE — Telephone Encounter (Signed)
Preadmission screen  

## 2023-03-02 ENCOUNTER — Other Ambulatory Visit: Payer: Self-pay | Admitting: Advanced Practice Midwife

## 2023-03-03 DIAGNOSIS — I10 Essential (primary) hypertension: Secondary | ICD-10-CM | POA: Diagnosis not present

## 2023-03-04 ENCOUNTER — Inpatient Hospital Stay (HOSPITAL_COMMUNITY)
Admission: AD | Admit: 2023-03-04 | Discharge: 2023-03-05 | Disposition: A | Discharge status: Home / Self Care | Payer: Medicaid Other | Attending: Obstetrics and Gynecology | Admitting: Obstetrics and Gynecology

## 2023-03-04 ENCOUNTER — Encounter (HOSPITAL_COMMUNITY): Payer: Self-pay | Admitting: Obstetrics and Gynecology

## 2023-03-04 DIAGNOSIS — O09523 Supervision of elderly multigravida, third trimester: Secondary | ICD-10-CM | POA: Insufficient documentation

## 2023-03-04 DIAGNOSIS — L299 Pruritus, unspecified: Secondary | ICD-10-CM | POA: Diagnosis not present

## 2023-03-04 DIAGNOSIS — O99891 Other specified diseases and conditions complicating pregnancy: Secondary | ICD-10-CM | POA: Diagnosis not present

## 2023-03-04 DIAGNOSIS — O1203 Gestational edema, third trimester: Secondary | ICD-10-CM

## 2023-03-04 DIAGNOSIS — O133 Gestational [pregnancy-induced] hypertension without significant proteinuria, third trimester: Secondary | ICD-10-CM | POA: Diagnosis not present

## 2023-03-04 DIAGNOSIS — O0943 Supervision of pregnancy with grand multiparity, third trimester: Secondary | ICD-10-CM | POA: Insufficient documentation

## 2023-03-04 DIAGNOSIS — Z3A36 36 weeks gestation of pregnancy: Secondary | ICD-10-CM | POA: Insufficient documentation

## 2023-03-04 DIAGNOSIS — O3433 Maternal care for cervical incompetence, third trimester: Secondary | ICD-10-CM | POA: Insufficient documentation

## 2023-03-04 LAB — COMPREHENSIVE METABOLIC PANEL
ALT: 16 U/L (ref 0–44)
AST: 15 U/L (ref 15–41)
Albumin: 2.9 g/dL — ABNORMAL LOW (ref 3.5–5.0)
Alkaline Phosphatase: 84 U/L (ref 38–126)
Anion gap: 14 (ref 5–15)
BUN: <5 mg/dL — ABNORMAL LOW (ref 6–20)
CO2: 20 mmol/L — ABNORMAL LOW (ref 22–32)
Calcium: 9.4 mg/dL (ref 8.9–10.3)
Chloride: 105 mmol/L (ref 98–111)
Creatinine, Ser: 0.5 mg/dL (ref 0.44–1.00)
GFR, Estimated: >60 mL/min (ref >60)
Glucose, Bld: 81 mg/dL (ref 70–99)
Potassium: 3.4 mmol/L — ABNORMAL LOW (ref 3.5–5.1)
Sodium: 139 mmol/L (ref 135–145)
Total Bilirubin: 0.3 mg/dL (ref 0.3–1.2)
Total Protein: 6.1 g/dL — ABNORMAL LOW (ref 6.5–8.1)

## 2023-03-04 LAB — URINALYSIS, ROUTINE W REFLEX MICROSCOPIC
Bacteria, UA: NONE SEEN
Bilirubin Urine: NEGATIVE
Glucose, UA: NEGATIVE mg/dL
Ketones, ur: NEGATIVE mg/dL
Leukocytes,Ua: NEGATIVE
Nitrite: NEGATIVE
Protein, ur: NEGATIVE mg/dL
Specific Gravity, Urine: 1.008 (ref 1.005–1.030)
pH: 6 (ref 5.0–8.0)

## 2023-03-04 LAB — CBC
HCT: 35.5 % — ABNORMAL LOW (ref 36.0–46.0)
Hemoglobin: 11 g/dL — ABNORMAL LOW (ref 12.0–15.0)
MCH: 23.5 pg — ABNORMAL LOW (ref 26.0–34.0)
MCHC: 31 g/dL (ref 30.0–36.0)
MCV: 75.9 fL — ABNORMAL LOW (ref 80.0–100.0)
Platelets: 207 10*3/uL (ref 150–400)
RBC: 4.68 MIL/uL (ref 3.87–5.11)
RDW: 16.6 % — ABNORMAL HIGH (ref 11.5–15.5)
WBC: 7.2 10*3/uL (ref 4.0–10.5)
nRBC: 0 % (ref 0.0–0.2)

## 2023-03-04 NOTE — MAU Provider Note (Signed)
History     CSN: [id number]  Arrival date and time: 03/04/23 2050   None     Chief Complaint  Patient presents with   Leg Swelling   HPI Rebecca Webster is a 42 y.o. [id number] at [redacted]w[redacted]d who presents to MAU for bilateral swelling in feet and ankles. She reports she noticed swelling 3 days ago. She initially thought it was because she had "been out and about" however it did not go away. She reports the left side has gone down but still has it in the right side. She also reports itching that started 3 days ago when the swelling started. Initially the itching was in her feet but now feels everywhere. She denies rashes. She has not taken anything to relieve itching. She denies pain, tenderness, swelling, or warmth behind calves. She denies headache, vision changes, or RUQ/epigastric pain. Patient reports some irregular contractions, but denies vaginal bleeding or leaking fluid. She reports normal fetal movement.   Pregnancy course: gHTN (scheduled for IOL on 8/2), AMA, cervical insufficiency. Patient receives [facility], next appointment is on 7/31.  OB History     Gravida  10   Para  5   Term  3   Preterm  2   AB  4   Living  6      SAB  3   IAB      Ectopic  1   Multiple  1   Live Births  6           Past Medical History:  Diagnosis Date   Anemia    not currently anemic   Beta thalassemia trait    Gestational HTN    Incompetent cervix    Vulvovaginal candidiasis 01/23/2023    Past Surgical History:  Procedure Laterality Date   ABDOMINOPLASTY     CERVICAL CERCLAGE N/A 05/06/2019   Procedure: CERCLAGE CERVICAL;  Surgeon: Maxie Better, MD;  Location: [facility];  Service: Gynecology;  Laterality: N/A;   CERVICAL CERCLAGE N/A 09/29/2019   Procedure: CERCLAGE CERVICAL;  Surgeon: Maxie Better, MD;  Location: [facility] LD [facility]S;  Service: Gynecology;  Laterality: N/A;  EDD: 03/28/20   CERVICAL CERCLAGE N/A 09/22/2022   Procedure: CERCLAGE CERVICAL;   Surgeon: Lennart Pall, MD;  Location: [facility] LD [facility]S;  Service: Gynecology;  Laterality: N/A;   CESAREAN SECTION  2001   Reversal of tubal ligation     TUBAL LIGATION     WISDOM TOOTH EXTRACTION      Family History  Problem Relation Age of Onset   Hypertension Mother    Cancer Father        prostate   Heart disease Maternal Grandmother    Stroke Maternal Grandmother    Diabetes Neg Hx    Asthma Neg Hx     Social History   Tobacco Use   Smoking status: Never   Smokeless tobacco: Never  Vaping Use   Vaping status: Never Used  Substance Use Topics   Alcohol use: No   Drug use: No    Allergies: No Known Allergies  No medications prior to admission.   Review of Systems  Constitutional:        Itching   Cardiovascular:        Swelling in feet/ankles  All other systems reviewed and are negative.  Physical Exam   Patient Vitals for the past 24 hrs:  BP Temp Temp src Pulse Resp SpO2 Height Weight  03/05/23 0008 120/62 -- --  86 -- -- -- --  03/04/23 2203 129/81 -- -- 96 -- -- -- --  03/04/23 2137 136/78 98.1 F (36.7 C) Oral 92 18 100 % 5\' 10"  (1.778 m) 124.8 kg   Physical Exam Vitals and nursing note reviewed.  Constitutional:      General: She is not in acute distress. Eyes:     Extraocular Movements: Extraocular movements intact.     Pupils: Pupils are equal, round, and reactive to light.  Cardiovascular:     Rate and Rhythm: Normal rate.  Pulmonary:     Effort: Pulmonary effort is normal. No respiratory distress.  Abdominal:     Palpations: Abdomen is soft.     Tenderness: There is no abdominal tenderness.     Comments: Gravid   Musculoskeletal:        General: Normal range of motion.     Cervical back: Normal range of motion.     Comments: +1 nonpitting edema bilateral ankles/feet  Skin:    General: Skin is warm and dry.  Neurological:     General: No focal deficit present.     Mental Status: She is alert and oriented to person, place, and time.   Psychiatric:        Mood and Affect: Mood normal.        Behavior: Behavior normal.   NST FHR: 145 bpm, moderate variability, +15x15 accels, 1 early decel Toco: irregular  Results for orders placed or performed during the hospital encounter of 03/04/23 (from the past 24 hour(s))  Urinalysis, Routine w reflex microscopic -Urine, Clean Catch     Status: Abnormal   Collection Time: 03/04/23  9:31 PM  Result Value Ref Range   Color, Urine STRAW (A) YELLOW   APPearance CLEAR CLEAR   Specific Gravity, Urine 1.008 1.005 - 1.030   pH 6.0 5.0 - 8.0   Glucose, UA NEGATIVE NEGATIVE mg/dL   Hgb urine dipstick SMALL (A) NEGATIVE   Bilirubin Urine NEGATIVE NEGATIVE   Ketones, ur NEGATIVE NEGATIVE mg/dL   Protein, ur NEGATIVE NEGATIVE mg/dL   Nitrite NEGATIVE NEGATIVE   Leukocytes,Ua NEGATIVE NEGATIVE   RBC / HPF 0-5 0 - 5 RBC/hpf   WBC, UA 0-5 0 - 5 WBC/hpf   Bacteria, UA NONE SEEN NONE SEEN   Squamous Epithelial / HPF 0-5 0 - 5 /HPF  CBC     Status: Abnormal   Collection Time: 03/04/23 10:51 PM  Result Value Ref Range   WBC 7.2 4.0 - 10.5 K/uL   RBC 4.68 3.87 - 5.11 MIL/uL   Hemoglobin 11.0 (L) 12.0 - 15.0 g/dL   HCT 96.0 (L) 45.4 - 09.8 %   MCV 75.9 (L) 80.0 - 100.0 fL   MCH 23.5 (L) 26.0 - 34.0 pg   MCHC 31.0 30.0 - 36.0 g/dL   RDW 11.9 (H) 14.7 - 82.9 %   Platelets 207 150 - 400 K/uL   nRBC 0.0 0.0 - 0.2 %  Comprehensive metabolic panel     Status: Abnormal   Collection Time: 03/04/23 10:51 PM  Result Value Ref Range   Sodium 139 135 - 145 mmol/L   Potassium 3.4 (L) 3.5 - 5.1 mmol/L   Chloride 105 98 - 111 mmol/L   CO2 20 (L) 22 - 32 mmol/L   Glucose, Bld 81 70 - 99 mg/dL   BUN <5 (L) 6 - 20 mg/dL   Creatinine, Ser 5.62 0.44 - 1.00 mg/dL   Calcium 9.4 8.9 - 13.0 mg/dL  Total Protein 6.1 (L) 6.5 - 8.1 g/dL   Albumin 2.9 (L) 3.5 - 5.0 g/dL   AST 15 15 - 41 U/L   ALT 16 0 - 44 U/L   Alkaline Phosphatase 84 38 - 126 U/L   Total Bilirubin 0.3 0.3 - 1.2 mg/dL   GFR,  Estimated >60 >63 mL/min   Anion gap 14 5 - 15    MAU Course  Procedures  MDM UA, culture CBC, CMP, Bile acids NST  Labs reassuring. Bile acids pending. NST reactive. Toco occasional ctx- patient unaware. BP's normotensive. Reassurance provided regarding swelling in ankles/feet.  Assessment and Plan   1. [redacted] weeks gestation of pregnancy   2. Edema during pregnancy in third trimester   3. Itching    - Discharge home in stable condition - Encourage PO hydration, elevate lower extremities - Return precautions given. Return to MAU as needed - Keep OB appointment as scheduled on 7/31  Brand Males, CNM 03/05/2023, 4:28 AM

## 2023-03-04 NOTE — MAU Note (Signed)
Pt stated that she had some itching that began 2 days ago in her feet. She states she thought it was because of the swelling but now her whole body feels itchy.

## 2023-03-04 NOTE — MAU Note (Signed)
.  Rebecca Webster is a 42 y.o. at [redacted]w[redacted]d here in MAU reporting: right leg swelling for the last 3 days. Pt states it was both legs were, however, she has noticed the left has decreased. RN assessed right leg 1+ pitting in ankles, and +2 pitting in left leg. Pt states she has GHTN, no medications. Pt denies other PIH s/s. Pt endorses FM+. Pt denies VB, LOF. Pt reports some intermittent leaking after voiding,, but has PROM this pregnancy that was resolved,   Onset of complaint: 3 days  Pain score: 0/10 Vitals:   03/04/23 2137  BP: 136/78  Pulse: 92  Resp: 18  Temp: 98.1 F (36.7 C)  SpO2: 100%     FHT:141 Lab orders placed from triage:  UA

## 2023-03-05 ENCOUNTER — Other Ambulatory Visit: Payer: Self-pay | Admitting: Obstetrics & Gynecology

## 2023-03-05 DIAGNOSIS — O1203 Gestational edema, third trimester: Secondary | ICD-10-CM | POA: Diagnosis not present

## 2023-03-05 DIAGNOSIS — O4290 Premature rupture of membranes, unspecified as to length of time between rupture and onset of labor, unspecified weeks of gestation: Secondary | ICD-10-CM

## 2023-03-05 DIAGNOSIS — I10 Essential (primary) hypertension: Secondary | ICD-10-CM | POA: Diagnosis not present

## 2023-03-05 DIAGNOSIS — Z3A36 36 weeks gestation of pregnancy: Secondary | ICD-10-CM

## 2023-03-05 DIAGNOSIS — O09523 Supervision of elderly multigravida, third trimester: Secondary | ICD-10-CM

## 2023-03-05 DIAGNOSIS — O3433 Maternal care for cervical incompetence, third trimester: Secondary | ICD-10-CM

## 2023-03-06 ENCOUNTER — Encounter: Payer: Self-pay | Admitting: Advanced Practice Midwife

## 2023-03-06 ENCOUNTER — Ambulatory Visit (INDEPENDENT_AMBULATORY_CARE_PROVIDER_SITE_OTHER): Payer: Medicaid Other | Admitting: Advanced Practice Midwife

## 2023-03-06 ENCOUNTER — Ambulatory Visit (INDEPENDENT_AMBULATORY_CARE_PROVIDER_SITE_OTHER): Payer: Medicaid Other

## 2023-03-06 VITALS — BP 135/88 | HR 84

## 2023-03-06 DIAGNOSIS — Z3A36 36 weeks gestation of pregnancy: Secondary | ICD-10-CM

## 2023-03-06 DIAGNOSIS — O09523 Supervision of elderly multigravida, third trimester: Secondary | ICD-10-CM

## 2023-03-06 DIAGNOSIS — O4290 Premature rupture of membranes, unspecified as to length of time between rupture and onset of labor, unspecified weeks of gestation: Secondary | ICD-10-CM | POA: Diagnosis not present

## 2023-03-06 DIAGNOSIS — O3433 Maternal care for cervical incompetence, third trimester: Secondary | ICD-10-CM

## 2023-03-06 NOTE — Progress Notes (Signed)
[place] 36+6 wks,cephalic,BPP 8/8,FHR 148 bpm,anterior placenta gr 2,AFI 17 cm

## 2023-03-06 NOTE — Progress Notes (Signed)
HIGH-RISK PREGNANCY VISIT Patient name: Rebecca Webster MRN [id number]  Date of birth: Aug 06, 1981 Chief Complaint:   Routine Prenatal Visit (Persistent contractions preventing good sleep)  History of Present Illness:   Rebecca Webster is a 42 y.o. [id number] female at [redacted]w[redacted]d with an Estimated Date of Delivery: 03/28/23 being seen today for ongoing management of a high-risk pregnancy complicated by gestational hypertension currently on no meds; AMA >40.    Today she reports occasional contractions. Contractions: Regular. Vag. Bleeding: None.  Movement: Present. denies leaking of fluid.      09/12/2022    1:42 PM 04/27/2022    8:41 AM  Depression screen PHQ 2/9  Decreased Interest 0 0  Down, Depressed, Hopeless 0 0  PHQ - 2 Score 0 0  Altered sleeping 2   Tired, decreased energy 1   Change in appetite 0   Feeling bad or failure about yourself  0   Trouble concentrating 0   Moving slowly or fidgety/restless 0   Suicidal thoughts 0   PHQ-9 Score 3         09/12/2022    1:43 PM  GAD 7 : Generalized Anxiety Score  Nervous, Anxious, on Edge 1  Control/stop worrying 0  Worry too much - different things 0  Trouble relaxing 0  Restless 0  Easily annoyed or irritable 0  Afraid - awful might happen 0  Total GAD 7 Score 1     Review of Systems:   Pertinent items are noted in HPI Denies abnormal vaginal discharge w/ itching/odor/irritation, headaches, visual changes, shortness of breath, chest pain, abdominal pain, severe nausea/vomiting, or problems with urination or bowel movements unless otherwise stated above. Pertinent History Reviewed:  Reviewed past medical,surgical, social, obstetrical and family history.  Reviewed problem list, medications and allergies. Physical Assessment:   Vitals:   03/06/23 1447  BP: 135/88  Pulse: 84  There is no height or weight on file to calculate BMI.           Physical Examination:   General appearance: alert, well appearing, and in no  distress  Mental status: alert, oriented to person, place, and time  Skin: warm & dry   Extremities: Edema: Trace    Cardiovascular: normal heart rate noted  Respiratory: normal respiratory effort, no distress  Abdomen: gravid, soft, non-tender  Pelvic: Cervical exam deferred         Fetal Status: Fetal Heart Rate (bpm): 148 u/s   Movement: Present    Fetal Surveillance Testing today: [place] 36+6 wks,cephalic,BPP 8/8,FHR 148 bpm,anterior placenta gr 2,AFI 17 cm     No results found for this or any previous visit (from the past 24 hour(s)).  Assessment & Plan:  High-risk pregnancy: [id number] at [redacted]w[redacted]d with an Estimated Date of Delivery: 03/28/23   1) gHTN, BPs stable; has IOL already scheduled 03/08/23  2) Prev C/S, plans for VBAC  Meds: No orders of the defined types were placed in this encounter.   Labs/procedures today: U/S  Treatment Plan:  IOL 03/08/23  Reviewed: Term labor symptoms and general obstetric precautions including but not limited to vaginal bleeding, contractions, leaking of fluid and fetal movement were reviewed in detail with the patient.  All questions were answered. Does have home bp cuff. Office bp cuff given: not applicable. Check bp daily, let us know if consistently >140 and/or >90.  Follow-up: Return for RN BP check in about 10 days; PP visit in 6 weeks.   Future Appointments  Date Time  Provider Department Center  03/06/2023  3:50 PM Arabella Merles, CNM [facility]  03/08/2023  7:00 AM [facility] SCHED ROOM [facility] None    No orders of the defined types were placed in this encounter.  Arabella Merles CNM 03/06/2023 3:05 PM

## 2023-03-08 ENCOUNTER — Inpatient Hospital Stay (HOSPITAL_COMMUNITY): Payer: Medicaid Other

## 2023-03-08 ENCOUNTER — Other Ambulatory Visit: Payer: Medicaid Other

## 2023-03-08 ENCOUNTER — Inpatient Hospital Stay (HOSPITAL_COMMUNITY): Payer: Medicaid Other | Admitting: Anesthesiology

## 2023-03-08 ENCOUNTER — Encounter: Payer: Medicaid Other | Admitting: Obstetrics & Gynecology

## 2023-03-08 ENCOUNTER — Encounter (HOSPITAL_COMMUNITY): Payer: Self-pay | Admitting: Obstetrics & Gynecology

## 2023-03-08 ENCOUNTER — Inpatient Hospital Stay (HOSPITAL_COMMUNITY)
Admission: RE | Admit: 2023-03-08 | Discharge: 2023-03-10 | DRG: 807 | Disposition: A | Discharge status: Home / Self Care | Payer: Medicaid Other | Attending: Obstetrics and Gynecology | Admitting: Obstetrics and Gynecology

## 2023-03-08 DIAGNOSIS — Z148 Genetic carrier of other disease: Secondary | ICD-10-CM

## 2023-03-08 DIAGNOSIS — O34219 Maternal care for unspecified type scar from previous cesarean delivery: Secondary | ICD-10-CM | POA: Diagnosis present

## 2023-03-08 DIAGNOSIS — O134 Gestational [pregnancy-induced] hypertension without significant proteinuria, complicating childbirth: Principal | ICD-10-CM | POA: Diagnosis present

## 2023-03-08 DIAGNOSIS — Z3A37 37 weeks gestation of pregnancy: Secondary | ICD-10-CM

## 2023-03-08 DIAGNOSIS — Z8759 Personal history of other complications of pregnancy, childbirth and the puerperium: Secondary | ICD-10-CM | POA: Diagnosis present

## 2023-03-08 DIAGNOSIS — Z7982 Long term (current) use of aspirin: Secondary | ICD-10-CM

## 2023-03-08 DIAGNOSIS — O9902 Anemia complicating childbirth: Secondary | ICD-10-CM | POA: Diagnosis present

## 2023-03-08 DIAGNOSIS — O09529 Supervision of elderly multigravida, unspecified trimester: Secondary | ICD-10-CM

## 2023-03-08 DIAGNOSIS — O4292 Full-term premature rupture of membranes, unspecified as to length of time between rupture and onset of labor: Secondary | ICD-10-CM | POA: Diagnosis not present

## 2023-03-08 DIAGNOSIS — O139 Gestational [pregnancy-induced] hypertension without significant proteinuria, unspecified trimester: Secondary | ICD-10-CM | POA: Diagnosis present

## 2023-03-08 DIAGNOSIS — Z98891 History of uterine scar from previous surgery: Secondary | ICD-10-CM

## 2023-03-08 DIAGNOSIS — O34211 Maternal care for low transverse scar from previous cesarean delivery: Secondary | ICD-10-CM | POA: Diagnosis not present

## 2023-03-08 DIAGNOSIS — O26893 Other specified pregnancy related conditions, third trimester: Secondary | ICD-10-CM | POA: Diagnosis not present

## 2023-03-08 DIAGNOSIS — O99013 Anemia complicating pregnancy, third trimester: Secondary | ICD-10-CM | POA: Diagnosis present

## 2023-03-08 DIAGNOSIS — O0993 Supervision of high risk pregnancy, unspecified, third trimester: Principal | ICD-10-CM

## 2023-03-08 DIAGNOSIS — O09523 Supervision of elderly multigravida, third trimester: Secondary | ICD-10-CM | POA: Diagnosis not present

## 2023-03-08 LAB — COMPREHENSIVE METABOLIC PANEL
ALT: 17 U/L (ref 0–44)
AST: 15 U/L (ref 15–41)
Albumin: 2.8 g/dL — ABNORMAL LOW (ref 3.5–5.0)
Alkaline Phosphatase: 95 U/L (ref 38–126)
Anion gap: 10 (ref 5–15)
BUN: 5 mg/dL — ABNORMAL LOW (ref 6–20)
CO2: 20 mmol/L — ABNORMAL LOW (ref 22–32)
Calcium: 9.3 mg/dL (ref 8.9–10.3)
Chloride: 104 mmol/L (ref 98–111)
Creatinine, Ser: 0.46 mg/dL (ref 0.44–1.00)
GFR, Estimated: >60 mL/min (ref >60)
Glucose, Bld: 82 mg/dL (ref 70–99)
Potassium: 3.5 mmol/L (ref 3.5–5.1)
Sodium: 134 mmol/L — ABNORMAL LOW (ref 135–145)
Total Bilirubin: 0.4 mg/dL (ref 0.3–1.2)
Total Protein: 6.3 g/dL — ABNORMAL LOW (ref 6.5–8.1)

## 2023-03-08 LAB — CBC
HCT: 35.8 % — ABNORMAL LOW (ref 36.0–46.0)
Hemoglobin: 11.4 g/dL — ABNORMAL LOW (ref 12.0–15.0)
MCH: 24.3 pg — ABNORMAL LOW (ref 26.0–34.0)
MCHC: 31.8 g/dL (ref 30.0–36.0)
MCV: 76.2 fL — ABNORMAL LOW (ref 80.0–100.0)
Platelets: 217 10*3/uL (ref 150–400)
RBC: 4.7 MIL/uL (ref 3.87–5.11)
RDW: 16.3 % — ABNORMAL HIGH (ref 11.5–15.5)
WBC: 7.4 10*3/uL (ref 4.0–10.5)
nRBC: 0 % (ref 0.0–0.2)

## 2023-03-08 LAB — TYPE AND SCREEN
ABO/RH(D): O POS
Antibody Screen: NEGATIVE

## 2023-03-08 LAB — PROTEIN / CREATININE RATIO, URINE
Creatinine, Urine: 20 mg/dL
Total Protein, Urine: <6 mg/dL

## 2023-03-08 MED ORDER — MISOPROSTOL 200 MCG PO TABS
400.0000 ug | ORAL_TABLET | Freq: Once | ORAL | Status: AC
  Administered 2023-03-08: 400 ug via RECTAL
  Filled 2023-03-08: qty 2

## 2023-03-08 MED ORDER — ACETAMINOPHEN 325 MG PO TABS: 650.0000 mg | ORAL_TABLET | ORAL | Status: DC | PRN

## 2023-03-08 MED ORDER — LACTATED RINGERS IV SOLN: INTRAVENOUS | Status: DC

## 2023-03-08 MED ORDER — PHENYLEPHRINE 80 MCG/ML (10ML) SYRINGE FOR IV PUSH (FOR BLOOD PRESSURE SUPPORT): 80.0000 ug | PREFILLED_SYRINGE | INTRAVENOUS | Status: DC | PRN

## 2023-03-08 MED ORDER — TERBUTALINE SULFATE 1 MG/ML IJ SOLN: 0.2500 mg | Freq: Once | INTRAMUSCULAR | Status: DC | PRN

## 2023-03-08 MED ORDER — LIDOCAINE HCL (PF) 1 % IJ SOLN: 30.0000 mL | INTRAMUSCULAR | Status: DC | PRN

## 2023-03-08 MED ORDER — OXYTOCIN-SODIUM CHLORIDE 30-0.9 UT/500ML-% IV SOLN
1.0000 m[IU]/min | INTRAVENOUS | Status: DC
  Administered 2023-03-08: 2 m[IU]/min via INTRAVENOUS
  Filled 2023-03-08: qty 500

## 2023-03-08 MED ORDER — OXYTOCIN-SODIUM CHLORIDE 30-0.9 UT/500ML-% IV SOLN: 2.5000 [IU]/h | INTRAVENOUS | Status: DC

## 2023-03-08 MED ORDER — LACTATED RINGERS IV SOLN
500.0000 mL | Freq: Once | INTRAVENOUS | Status: AC
  Administered 2023-03-08: 500 mL via INTRAVENOUS

## 2023-03-08 MED ORDER — FENTANYL-BUPIVACAINE-NACL 0.5-0.125-0.9 MG/250ML-% EP SOLN
12.0000 mL/h | EPIDURAL | Status: DC | PRN
  Administered 2023-03-08: 12 mL/h via EPIDURAL
  Filled 2023-03-08: qty 250

## 2023-03-08 MED ORDER — FENTANYL CITRATE (PF) 100 MCG/2ML IJ SOLN: 50.0000 ug | INTRAMUSCULAR | Status: DC | PRN

## 2023-03-08 MED ORDER — EPHEDRINE 5 MG/ML INJ: 10.0000 mg | INTRAVENOUS | Status: DC | PRN

## 2023-03-08 MED ORDER — LIDOCAINE-EPINEPHRINE (PF) 2 %-1:200000 IJ SOLN
INTRAMUSCULAR | Status: DC | PRN
  Administered 2023-03-08: 8 mL via EPIDURAL

## 2023-03-08 MED ORDER — ONDANSETRON HCL 4 MG/2ML IJ SOLN: 4.0000 mg | Freq: Four times a day (QID) | INTRAMUSCULAR | Status: DC | PRN

## 2023-03-08 MED ORDER — LACTATED RINGERS IV SOLN: 500.0000 mL | INTRAVENOUS | Status: DC | PRN

## 2023-03-08 MED ORDER — OXYCODONE-ACETAMINOPHEN 5-325 MG PO TABS: 1.0000 | ORAL_TABLET | ORAL | Status: DC | PRN

## 2023-03-08 MED ORDER — LIDOCAINE HCL (PF) 1 % IJ SOLN
INTRAMUSCULAR | Status: DC | PRN
  Administered 2023-03-08 (×2): 4 mL via EPIDURAL

## 2023-03-08 MED ORDER — SOD CITRATE-CITRIC ACID 500-334 MG/5ML PO SOLN: 30.0000 mL | ORAL | Status: DC | PRN

## 2023-03-08 MED ORDER — OXYTOCIN BOLUS FROM INFUSION
333.0000 mL | Freq: Once | INTRAVENOUS | Status: AC
  Administered 2023-03-08: 333 mL via INTRAVENOUS

## 2023-03-08 MED ORDER — MISOPROSTOL 200 MCG PO TABS
400.0000 ug | ORAL_TABLET | Freq: Once | ORAL | Status: AC
  Administered 2023-03-08: 400 ug via ORAL
  Filled 2023-03-08: qty 2

## 2023-03-08 MED ORDER — OXYCODONE-ACETAMINOPHEN 5-325 MG PO TABS: 2.0000 | ORAL_TABLET | ORAL | Status: DC | PRN

## 2023-03-08 MED ORDER — DIPHENHYDRAMINE HCL 50 MG/ML IJ SOLN: 12.5000 mg | INTRAMUSCULAR | Status: DC | PRN

## 2023-03-08 NOTE — Progress Notes (Signed)
Labor Progress Note  Rebecca Webster is a 42 y.o. [id number] at [redacted]w[redacted]d presented for IOL for gHTN, TOLAC.  S: Mom feeling mild pain and discomfort with contractions.  O:  BP 135/80   Pulse 88   Temp 98.7 F (37.1 C) (Oral)   Resp 18   Ht 5\' 10"  (1.778 m)   Wt 125.3 kg   LMP 06/21/2022 (Exact Date)   SpO2 99%   BMI 39.64 kg/m  EFM:140 bpm/Moderate variability/ 10x10 accels/ None decels CAT: 1 Toco: regular, every 2.5 - 3 minutes   CVE: Dilation: 3 Effacement (%): 50 Station: -3 Presentation: Vertex Exam by:: Dr. Camelia Phenes   A&P: 42 y.o. [id number] [redacted]w[redacted]d  here for IOL as above.  #Labor: Progressing well. Continue pitocin. Pt. prefers no AROM. #Pain: Family/Friend support; per pt. request. #FWB: CAT 1 #GBS negative   [place] E Nayely Dingus, Medical Student [facility], Center for [facility] 03/08/23  3:40 PM

## 2023-03-08 NOTE — Discharge Summary (Signed)
Postpartum Discharge Summary  Date of Service updated-8/4     Patient Name: Rebecca Webster DOB: 05-12-1981 MRN: [id number]  Date of admission: 03/08/2023 Delivery date:03/08/2023 Delivering provider: Cam Hai D Date of discharge: 03/10/2023  Admitting diagnosis: Gestational hypertension [O13.9] Intrauterine pregnancy: [redacted]w[redacted]d     Secondary diagnosis:  Active Problems:   History of vaginal delivery following previous cesarean delivery   Gestational hypertension   AMA (advanced maternal age) multigravida 35+   Anemia affecting pregnancy in third trimester  Additional problems: none    Discharge diagnosis: Term Pregnancy Delivered and Gestational Hypertension                                              Post partum procedures: none Augmentation: Pitocin Complications: None  Hospital course: Induction of Labor With Vaginal Delivery   42 y.o. yo [id number] at [redacted]w[redacted]d was admitted to the hospital 03/08/2023 for induction of labor.  Indication for induction: Gestational hypertension.  Patient had an uncomplicated labor course with normal BPs and neg pre-e labs. Membrane Rupture Time/Date: 10:00 PM,12/17/2022  Delivery Method:VBAC, Spontaneous Operative Delivery:N/A Episiotomy: None Lacerations:  None Details of delivery can be found in separate delivery note.  Patient had a postpartum course remarkable for a 5 day course of Lasix 20mg  and BPs remained stable without medication. Patient is discharged home 03/10/23.  Newborn Data: Birth date:03/08/2023 Birth time:8:56 PM Gender:Female Living status:Living Apgars:8 ,9  Weight:2778 g (6lb 2oz)  Magnesium Sulfate received: No BMZ received: No Rhophylac:N/A MMR:N/A T-DaP: offered PP Flu: No Transfusion:No  Physical exam  Vitals:   03/09/23 1153 03/09/23 1941 03/10/23 0414 03/10/23 0757  BP: 127/74 118/67 110/77 121/69  Pulse: 95 95 87 83  Resp: 18 17 18 18   Temp: 98.6 F (37 C) 97.9 F (36.6 C) (!) 97.5 F (36.4 C) 97.7 F  (36.5 C)  TempSrc: Oral Oral Oral Oral  SpO2: 99% 98% 99% 100%  Weight:      Height:       General: alert, cooperative, and no distress Lochia: appropriate Uterine Fundus: firm Incision: N/A DVT Evaluation: No evidence of DVT seen on physical exam. Labs: Lab Results  Component Value Date   WBC 11.5 (H) 03/09/2023   HGB 11.5 (L) 03/09/2023   HCT 36.4 03/09/2023   MCV 74.6 (L) 03/09/2023   PLT 213 03/09/2023      Latest Ref Rng & Units 03/08/2023   11:59 AM  CMP  Glucose 70 - 99 mg/dL 82   BUN 6 - 20 mg/dL 5   Creatinine 5.62 - 1.30 mg/dL 8.65   Sodium 784 - 696 mmol/L 134   Potassium 3.5 - 5.1 mmol/L 3.5   Chloride 98 - 111 mmol/L 104   CO2 22 - 32 mmol/L 20   Calcium 8.9 - 10.3 mg/dL 9.3   Total Protein 6.5 - 8.1 g/dL 6.3   Total Bilirubin 0.3 - 1.2 mg/dL 0.4   Alkaline Phos 38 - 126 U/L 95   AST 15 - 41 U/L 15   ALT 0 - 44 U/L 17    Edinburgh Score:    03/09/2023    6:36 PM  Edinburgh Postnatal Depression Scale Screening Tool  I have been able to laugh and see the funny side of things. 0  I have looked forward with enjoyment to things. 0  I have blamed myself  unnecessarily when things went wrong. 0  I have been anxious or worried for no good reason. 1  I have felt scared or panicky for no good reason. 1  Things have been getting on top of me. 0  I have been so unhappy that I have had difficulty sleeping. 0  I have felt sad or miserable. 0  I have been so unhappy that I have been crying. 0  The thought of harming myself has occurred to me. 0  Edinburgh Postnatal Depression Scale Total 2     After visit meds:  Allergies as of 03/10/2023   No Known Allergies      Medication List     STOP taking these medications    aspirin 81 MG chewable tablet   polyethylene glycol powder 17 GM/SCOOP powder Commonly known as: GLYCOLAX/MIRALAX       TAKE these medications    acetaminophen 325 MG tablet Commonly known as: Tylenol Take 2 tablets (650 mg total) by  mouth every 4 (four) hours as needed (for pain scale < 4).   furosemide 20 MG tablet Commonly known as: LASIX Take 1 tablet (20 mg total) by mouth daily for 3 days.   ibuprofen 600 MG tablet Commonly known as: ADVIL Take 1 tablet (600 mg total) by mouth every 6 (six) hours.   Vitafol Ultra 29-0.6-0.4-200 MG Caps Take 1 capsule by mouth daily.         Discharge home in stable condition Infant Feeding: Breast Infant Disposition:home with mother Discharge instruction: per After Visit Summary and Postpartum booklet. Activity: Advance as tolerated. Pelvic rest for 6 weeks.  Diet: routine diet Future Appointments: to be scheduled Follow up Visit:  Follow-up Information     [facility] for [facility]. Go in 1 week(s).   Specialty: Obstetrics and Gynecology Why: Please follow up in 1 wk for a BP check Contact information: [address] [place] [address] [phone]                Arabella Merles, CNM  Myrle Sheng R Please schedule this patient for Postpartum visit in: 6 weeks with the following provider: Any provider In-Person For C/S patients schedule nurse incision check in weeks 2 weeks: no High risk pregnancy complicated by: gHTN Delivery mode:  SVD Anticipated Birth Control:  spouse had vasectomy PP Procedures needed: RN BP check in 1wk Schedule Integrated BH visit: no   03/10/2023 Sharon Seller, DO

## 2023-03-08 NOTE — Progress Notes (Signed)
Labor Progress Note  Rebecca Webster is a 42 y.o. [id number] at [redacted]w[redacted]d presented for IOL for gHTN, TOLAC.  S: Pt. feeling comfortable since starting epidural.  O:  BP (!) 117/53   Pulse 92   Temp 98.5 F (36.9 C) (Oral)   Resp 18   Ht 5\' 10"  (1.778 m)   Wt 125.3 kg   LMP 06/21/2022 (Exact Date)   SpO2 100%   BMI 39.64 kg/m  EFM:130 bpm/Moderate variability/ None accels/ None decels CAT: 1 Toco: regular, every 2 - 3 minutes   CVE: Dilation: 3 Effacement (%): 60 Station: -2 Presentation: Vertex Exam by:: Amado Nash, RN   A&P: 42 y.o. [id number] [redacted]w[redacted]d  here for IOL as above.  #Labor: Progressing well. Continue pitocin. Pt. prefers no AROM. #Pain: Family/Friend support; epidural started. #FWB: CAT 1 #GBS negative  [place] E Jocilynn Grade, Medical Student [facility], Center for [facility] 03/08/23  6:31 PM

## 2023-03-08 NOTE — H&P (Cosign Needed Addendum)
OBSTETRIC ADMISSION HISTORY AND PHYSICAL  Rebecca Webster is a 42 y.o. female [phone] with IUP at [redacted]w[redacted]d by LMP presenting for IOL, TOLAC. She reports +FMs, No LOF, no VB, no blurry vision, headaches or peripheral edema, and RUQ pain.  She plans on breastfeeding. Her spouse had a vasectomy, so she requests no additional birthcontrol. She received her prenatal care at [facility]   Dating: By LMP --->  Estimated Date of Delivery: 03/28/23  Sono:    @[redacted]w[redacted]d , CWD, normal anatomy, cephalic presentation, anterior placenta, 2558g, 49% EFW   Prenatal History/Complications: [place]   FAMILY TREE  RESULTS  Language English Pap 05/11/22 NILM, -HRHPV  Initiated care at  [facility] Initial:  -/-          36wks:  Dating by 03/28/2023 by LMP    Support person  Genetics NT/IT:   negative  AFP:      Panorama: LR female  BP cuff Has bp cuff Carrier Screen declines    [place]/Hgb Elec   Rhogam N/a    TDaP vaccine  Blood Type --/--/O POS (06/19 1008)  Flu vaccine  Antibody NEG (06/19 1008)  Covid vaccine  HBsAg Negative (02/07 1619)    RPR Non Reactive (02/07 1619)  Anatomy US boy Rubella  1.67 (02/07 1619)  Feeding Plan breast HIV Non Reactive (02/07 1619)  Contraception  Hep C neg  Circumcision     Pediatrician [facility] A1C/GTT Early:      26-28wks:  Prenatal Classes       GBS Negative/-- (07/24 1200) negative [ ]  PCN allergy  BTL Consent     VBAC Consent  PHQ9 & GAD7  [ ] New OB  [ ] 28wks   [ ] 36wks  Waterbirth [ ] Class [ ]  36wkCNM visit/consent     Past Medical History: Past Medical History:  Diagnosis Date   Anemia    not currently anemic   Beta thalassemia trait    Gestational HTN    Incompetent cervix    Vulvovaginal candidiasis 01/23/2023    Past Surgical History: Past Surgical History:  Procedure Laterality Date   ABDOMINOPLASTY     CERVICAL CERCLAGE N/A 05/06/2019   Procedure: CERCLAGE CERVICAL;  Surgeon: Maxie Better, MD;  Location: [facility] LD ORS;  Service: Gynecology;  Laterality:  N/A;   CERVICAL CERCLAGE N/A 09/29/2019   Procedure: CERCLAGE CERVICAL;  Surgeon: Maxie Better, MD;  Location: [facility] LD ORS;  Service: Gynecology;  Laterality: N/A;  EDD: 03/28/20   CERVICAL CERCLAGE N/A 09/22/2022   Procedure: CERCLAGE CERVICAL;  Surgeon: Lennart Pall, MD;  Location: [facility] LD ORS;  Service: Gynecology;  Laterality: N/A;   CESAREAN SECTION  2001   Reversal of tubal ligation     TUBAL LIGATION     WISDOM TOOTH EXTRACTION      Obstetrical History: OB History     Gravida  10   Para  5   Term  3   Preterm  2   AB  4   Living  6      SAB  3   IAB      Ectopic  1   Multiple  1   Live Births  6           Social History Social History   Socioeconomic History   Marital status: Married    Spouse name: Not on file   Number of children: Not on file   Years of education: Not on file   Highest education level: Not on  file  Occupational History   Not on file  Tobacco Use   Smoking status: Never   Smokeless tobacco: Never  Vaping Use   Vaping status: Never Used  Substance and Sexual Activity   Alcohol use: No   Drug use: No   Sexual activity: Not Currently    Birth control/protection: None  Other Topics Concern   Not on file  Social History Narrative   Not on file   Social Determinants of Health   Financial Resource Strain: Not on file  Food Insecurity: No Food Insecurity (12/18/2022)   Hunger Vital Sign    Worried About Running Out of Food in the Last Year: Never true    Ran Out of Food in the Last Year: Never true  Transportation Needs: No Transportation Needs (12/18/2022)   PRAPARE - Administrator, Civil Service (Medical): No    Lack of Transportation (Non-Medical): No  Physical Activity: Not on file  Stress: Not on file  Social Connections: Not on file    Family History: Family History  Problem Relation Age of Onset   Hypertension Mother    Cancer Father        prostate   Heart disease Maternal  Grandmother    Stroke Maternal Grandmother    Diabetes Neg Hx    Asthma Neg Hx     Allergies: No Known Allergies  Medications Prior to Admission  Medication Sig Dispense Refill Last Dose   aspirin 81 MG chewable tablet Chew 2 tablets (162 mg total) by mouth daily. 60 tablet 7    polyethylene glycol powder (GLYCOLAX/MIRALAX) 17 GM/SCOOP powder Take 1 Container by mouth once.      Prenat-Fe Poly-Methfol-FA-DHA (VITAFOL ULTRA) 29-0.6-0.4-200 MG CAPS Take 1 capsule by mouth daily. 30 capsule 12    Probiotic Product (PROBIOTIC PO) Take 1 capsule by mouth in the morning. Garden of Life Dr. Gillermina Phy Women's Probiotics (Patient not taking: Reported on 02/15/2023)        Review of Systems   All systems reviewed and negative except as stated in HPI  Last menstrual period 06/21/2022, unknown if currently breastfeeding. General appearance: alert, cooperative, and no distress Lungs: clear to auscultation bilaterally Heart: regular rate and rhythm Abdomen: soft, non-tender; bowel sounds normal Pelvic: 3 cm dilation  Extremities: Homans sign is negative, no sign of DVT Presentation: cephalic Fetal monitoring Baseline: 135 bpm, Variability: Good {> 6 bpm), Accelerations: Reactive, and Decelerations: Absent Uterine activityNone     Prenatal labs: ABO, Rh: --/--/O POS (06/19 1008) Antibody: NEG (06/19 1008) Rubella: 1.67 (02/07 1619) RPR: Non Reactive (02/07 1619)  HBsAg: Negative (02/07 1619)  HIV: Non Reactive (02/07 1619)  GBS: Negative/-- (07/24 1200)  1 hr Glucola Negative Genetic screening  Normal Anatomy US Normal  Prenatal Transfer Tool  Maternal Diabetes: No Genetic Screening: Normal Maternal Ultrasounds/Referrals: Normal Fetal Ultrasounds or other Referrals:  None Maternal Substance Abuse:  No Significant Maternal Medications:  None Significant Maternal Lab Results:  None Number of Prenatal Visits:greater than 3 verified prenatal visits Other Comments:  None  No  results found for this or any previous visit (from the past 24 hour(s)).  Patient Active Problem List   Diagnosis Date Noted   Anemia affecting pregnancy in third trimester 01/23/2023   Preterm premature rupture of membranes, antepartum 12/18/2022   Cervical cerclage suture present 12/18/2022   Cervical insufficiency during pregnancy in second trimester, antepartum 09/22/2022   AMA (advanced maternal age) multigravida 35+ 09/12/2022   Supervision of high-risk  pregnancy 08/20/2022   History of vaginal delivery following previous cesarean delivery 08/20/2022   Gestational hypertension 08/20/2022   History of preterm premature rupture of membranes 08/20/2022   History of cervical cerclage 09/29/2019    Assessment/Plan:  NATILIE KRABBENHOFT is a 42 y.o. [id number] at [redacted]w[redacted]d here for IOL as per above.  #Labor: Started 2 ml/min pitocin. Pt prefers no AROM. #Pain: Per pt request #FWB: Cat 1 #ID:  GBS negative #MOF: Breastfeeding #MOC:Spouse had vasectomy. #Circ:  Yes  [place] E Swandell, Medical Student  03/08/2023, 11:35 AM ___ Flonnie Hailstone of Supervision of Student:  I confirm that I have verified the information documented in the medical student's note and that I have also personally performed the history, physical exam and all medical decision making activities.  I have verified that all services and findings are accurately documented in this student's note; and I agree with management and plan as outlined in the documentation. I have also made any necessary editorial changes.  Myrtie Hawk, DO Center for [employer], [facility] 03/08/2023 3:13 PM

## 2023-03-08 NOTE — Anesthesia Preprocedure Evaluation (Signed)
Anesthesia Evaluation  Patient identified by MRN, date of birth, ID band Patient awake    Reviewed: Allergy & Precautions, Patient's Chart, lab work & pertinent test results  History of Anesthesia Complications Negative for: history of anesthetic complications  Airway Mallampati: II  TM Distance: >3 FB Neck ROM: Full    Dental no notable dental hx.    Pulmonary neg pulmonary ROS   Pulmonary exam normal        Cardiovascular hypertension (gHTN), Normal cardiovascular exam     Neuro/Psych negative neurological ROS     GI/Hepatic negative GI ROS, Neg liver ROS,,,  Endo/Other  negative endocrine ROS    Renal/GU negative Renal ROS     Musculoskeletal negative musculoskeletal ROS (+)    Abdominal   Peds  Hematology negative hematology ROS (+)   Anesthesia Other Findings Day of surgery medications reviewed with patient.  Reproductive/Obstetrics (+) Pregnancy                             Anesthesia Physical Anesthesia Plan  ASA: 2  Anesthesia Plan: Epidural   Post-op Pain Management:    Induction:   PONV Risk Score and Plan: Treatment may vary due to age or medical condition  Airway Management Planned: Natural Airway  Additional Equipment: Fetal Monitoring  Intra-op Plan:   Post-operative Plan:   Informed Consent: I have reviewed the patients History and Physical, chart, labs and discussed the procedure including the risks, benefits and alternatives for the proposed anesthesia with the patient or authorized representative who has indicated his/her understanding and acceptance.       Plan Discussed with:   Anesthesia Plan Comments:        Anesthesia Quick Evaluation

## 2023-03-08 NOTE — Anesthesia Procedure Notes (Signed)
Epidural Patient location during procedure: OB Start time: 03/08/2023 5:09 PM End time: 03/08/2023 5:12 PM  Staffing Anesthesiologist: Kaylyn Layer, MD Performed: anesthesiologist   Preanesthetic Checklist Completed: patient identified, IV checked, risks and benefits discussed, monitors and equipment checked, pre-op evaluation and timeout performed  Epidural Patient position: sitting Prep: DuraPrep and site prepped and draped Patient monitoring: continuous pulse ox, blood pressure and heart rate Approach: midline Location: L3-L4 Injection technique: LOR air  Needle:  Needle type: Tuohy  Needle gauge: 17 G Needle length: 9 cm Needle insertion depth: 7 cm Catheter type: closed end flexible Catheter size: 19 Gauge Catheter at skin depth: 12 cm Test dose: negative and Other (1% lidocaine)  Assessment Events: blood not aspirated, injection not painful, no injection resistance, no paresthesia and negative IV test  Additional Notes Patient identified. Risks, benefits, and alternatives discussed with patient including but not limited to bleeding, infection, nerve damage, paralysis, failed block, incomplete pain control, headache, blood pressure changes, nausea, vomiting, reactions to medication, itching, and postpartum back pain. Confirmed with bedside nurse the patient's most recent platelet count. Confirmed with patient that they are not currently taking any anticoagulation, have any bleeding history, or any family history of bleeding disorders. Patient expressed understanding and wished to proceed. All questions were answered. Sterile technique was used throughout the entire procedure. Please see nursing notes for vital signs.   Crisp LOR on first pass. DPE technique due to history of failed epidural with CSF obtained, spinal needle withdrawn and epidural catheter threaded easily. Test dose was given through epidural catheter and negative prior to continuing to dose epidural or start  infusion. Warning signs of high block given to the patient including shortness of breath, tingling/numbness in hands, complete motor block, or any concerning symptoms with instructions to call for help. Patient was given instructions on fall risk and not to get out of bed. All questions and concerns addressed with instructions to call with any issues or inadequate analgesia.  Reason for block:procedure for pain

## 2023-03-09 ENCOUNTER — Other Ambulatory Visit: Payer: Self-pay

## 2023-03-09 ENCOUNTER — Encounter (HOSPITAL_COMMUNITY): Payer: Self-pay | Admitting: Obstetrics & Gynecology

## 2023-03-09 MED ORDER — ACETAMINOPHEN 325 MG PO TABS
650.0000 mg | ORAL_TABLET | ORAL | Status: DC | PRN
  Filled 2023-03-09: qty 2

## 2023-03-09 MED ORDER — DIPHENHYDRAMINE HCL 25 MG PO CAPS: 25.0000 mg | ORAL_CAPSULE | Freq: Four times a day (QID) | ORAL | Status: DC | PRN

## 2023-03-09 MED ORDER — ZOLPIDEM TARTRATE 5 MG PO TABS: 5.0000 mg | ORAL_TABLET | Freq: Every evening | ORAL | Status: DC | PRN

## 2023-03-09 MED ORDER — BENZOCAINE-MENTHOL 20-0.5 % EX AERO: 1.0000 | INHALATION_SPRAY | CUTANEOUS | Status: DC | PRN

## 2023-03-09 MED ORDER — ONDANSETRON HCL 4 MG/2ML IJ SOLN: 4.0000 mg | INTRAMUSCULAR | Status: DC | PRN

## 2023-03-09 MED ORDER — FUROSEMIDE 20 MG PO TABS
20.0000 mg | ORAL_TABLET | Freq: Every day | ORAL | Status: DC
  Administered 2023-03-09 – 2023-03-10 (×2): 20 mg via ORAL
  Filled 2023-03-09 (×2): qty 1

## 2023-03-09 MED ORDER — POLYETHYLENE GLYCOL 3350 17 G PO PACK
17.0000 g | PACK | Freq: Every day | ORAL | Status: DC | PRN
  Administered 2023-03-09: 17 g via ORAL
  Filled 2023-03-09: qty 1

## 2023-03-09 MED ORDER — DIBUCAINE (PERIANAL) 1 % EX OINT: 1.0000 | TOPICAL_OINTMENT | CUTANEOUS | Status: DC | PRN

## 2023-03-09 MED ORDER — ONDANSETRON HCL 4 MG PO TABS: 4.0000 mg | ORAL_TABLET | ORAL | Status: DC | PRN

## 2023-03-09 MED ORDER — WITCH HAZEL-GLYCERIN EX PADS: 1.0000 | MEDICATED_PAD | CUTANEOUS | Status: DC | PRN

## 2023-03-09 MED ORDER — SIMETHICONE 80 MG PO CHEW: 80.0000 mg | CHEWABLE_TABLET | ORAL | Status: DC | PRN

## 2023-03-09 MED ORDER — COCONUT OIL OIL
1.0000 | TOPICAL_OIL | Status: DC | PRN
  Administered 2023-03-09: 1 via TOPICAL

## 2023-03-09 MED ORDER — PRENATAL MULTIVITAMIN CH
1.0000 | ORAL_TABLET | Freq: Every day | ORAL | Status: DC
  Administered 2023-03-09: 1 via ORAL
  Filled 2023-03-09: qty 1

## 2023-03-09 MED ORDER — TETANUS-DIPHTH-ACELL PERTUSSIS 5-2.5-18.5 LF-MCG/0.5 IM SUSY
0.5000 mL | PREFILLED_SYRINGE | Freq: Once | INTRAMUSCULAR | Status: DC
  Filled 2023-03-09 (×2): qty 0.5

## 2023-03-09 MED ORDER — IBUPROFEN 600 MG PO TABS
600.0000 mg | ORAL_TABLET | Freq: Four times a day (QID) | ORAL | Status: DC
  Administered 2023-03-09 – 2023-03-10 (×5): 600 mg via ORAL
  Filled 2023-03-09 (×6): qty 1

## 2023-03-09 MED ORDER — SENNOSIDES-DOCUSATE SODIUM 8.6-50 MG PO TABS
2.0000 | ORAL_TABLET | ORAL | Status: DC
  Administered 2023-03-09: 2 via ORAL
  Filled 2023-03-09: qty 2

## 2023-03-09 MED ORDER — MEASLES, MUMPS & RUBELLA VAC IJ SOLR: 0.5000 mL | Freq: Once | INTRAMUSCULAR | Status: DC

## 2023-03-09 MED ORDER — OXYCODONE HCL 5 MG PO TABS
5.0000 mg | ORAL_TABLET | ORAL | Status: DC | PRN
  Filled 2023-03-09: qty 1

## 2023-03-09 NOTE — Progress Notes (Signed)
POSTPARTUM PROGRESS NOTE  PPD#1  Subjective:  KASEE HANTZ is a 42 y.o. [id number] s/p VBAC at [redacted]w[redacted]d. Today she notes no acute complaints or concerns. She denies any problems with ambulating, voiding or po intake. Denies nausea or vomiting. She has passed flatus, no BM.  Pain is manageable- notes some discomfort with movement.  Lochia appropriate Denies fever/chills/chest pain/SOB.  no HA, no blurry vision, no RUQ pain  Objective: Blood pressure 124/71, pulse (!) 101, temperature 97.8 F (36.6 C), temperature source Oral, resp. rate 17, height 5\' 10"  (1.778 m), weight 125.3 kg, last menstrual period 06/21/2022, SpO2 98%, unknown if currently breastfeeding.  Physical Exam:  General: alert, cooperative and no distress Chest: no respiratory distress Heart: regular rate and rhythm Abdomen: soft, nontender Uterine Fundus: firm, appropriately tender DVT Evaluation: No calf swelling or tenderness Extremities: no edema Skin: warm, dry  Results for orders placed or performed during the hospital encounter of 03/08/23 (from the past 24 hour(s))  Comprehensive metabolic panel     Status: Abnormal   Collection Time: 03/08/23 11:59 AM  Result Value Ref Range   Sodium 134 (L) 135 - 145 mmol/L   Potassium 3.5 3.5 - 5.1 mmol/L   Chloride 104 98 - 111 mmol/L   CO2 20 (L) 22 - 32 mmol/L   Glucose, Bld 82 70 - 99 mg/dL   BUN 5 (L) 6 - 20 mg/dL   Creatinine, Ser 0.86 0.44 - 1.00 mg/dL   Calcium 9.3 8.9 - 57.8 mg/dL   Total Protein 6.3 (L) 6.5 - 8.1 g/dL   Albumin 2.8 (L) 3.5 - 5.0 g/dL   AST 15 15 - 41 U/L   ALT 17 0 - 44 U/L   Alkaline Phosphatase 95 38 - 126 U/L   Total Bilirubin 0.4 0.3 - 1.2 mg/dL   GFR, Estimated >46 >96 mL/min   Anion gap 10 5 - 15  CBC     Status: Abnormal   Collection Time: 03/08/23 11:59 AM  Result Value Ref Range   WBC 7.4 4.0 - 10.5 K/uL   RBC 4.70 3.87 - 5.11 MIL/uL   Hemoglobin 11.4 (L) 12.0 - 15.0 g/dL   HCT 29.5 (L) 28.4 - 13.2 %   MCV 76.2 (L) 80.0 -  100.0 fL   MCH 24.3 (L) 26.0 - 34.0 pg   MCHC 31.8 30.0 - 36.0 g/dL   RDW 44.0 (H) 10.2 - 72.5 %   Platelets 217 150 - 400 K/uL   nRBC 0.0 0.0 - 0.2 %  Type and screen     Status: None   Collection Time: 03/08/23 11:59 AM  Result Value Ref Range   ABO/RH(D) O POS    Antibody Screen NEG    Sample Expiration      03/11/2023,2359 Performed at [facility], [address] [place], [place] [address]   RPR     Status: None   Collection Time: 03/08/23 11:59 AM  Result Value Ref Range   RPR Ser Ql NON REACTIVE NON REACTIVE  Protein / creatinine ratio, urine     Status: None   Collection Time: 03/08/23 12:02 PM  Result Value Ref Range   Creatinine, Urine 20 mg/dL   Total Protein, Urine <6 mg/dL   Protein Creatinine Ratio        0.00 - 0.15 mg/mg[Cre]  CBC     Status: Abnormal   Collection Time: 03/09/23 12:10 AM  Result Value Ref Range   WBC 11.5 (H) 4.0 - 10.5  K/uL   RBC 4.88 3.87 - 5.11 MIL/uL   Hemoglobin 11.5 (L) 12.0 - 15.0 g/dL   HCT 30.1 60.1 - 09.3 %   MCV 74.6 (L) 80.0 - 100.0 fL   MCH 23.6 (L) 26.0 - 34.0 pg   MCHC 31.6 30.0 - 36.0 g/dL   RDW 23.5 (H) 57.3 - 22.0 %   Platelets 213 150 - 400 K/uL   nRBC 0.0 0.0 - 0.2 %    Assessment/Plan: ASTA CORBRIDGE is a 42 y.o. [id number] s/p VBAC at [redacted]w[redacted]d PPD#1 -GestHTN- BP stable, no meds currently, plan for Lasix x 5 days -routine postpartum care  Contraception: vasectomy Feeding: breast  Consent for circumcision completed   Dispo: routine OB care, plan for discharge home tomorrow   LOS: 1 day   Myna Hidalgo, DO Faculty Attending, Center for [facility] 03/09/2023, 10:54 AM

## 2023-03-09 NOTE — Lactation Note (Signed)
This note was copied from a baby's chart. Lactation Consultation Note  Patient Name: Boy Sarajean Dessert [id number] Date: 03/09/2023 Age:42 hours   LC in to visit with P7 Mom of ET infant delivered by VBAC.  Baby's weight at birth was 6 lbs. 2 oz.  Mom desires to primarily breastfeed baby.  Mom states baby latched on well initially and fed well three times.  Baby noted to have an emesis and choking episode earlier this am.  Baby fed one bottle (4 ml) after this.  Currently baby sleeping swaddled in crib.  As LC was reviewing basics, Mom caught baby spitting up while lying down.  LC picked baby up and baby recovered easily, no choking.    Talked about the importance of baby being offer the breast at least every 3 hrs, or at least being STS at that time.  Talked about normal newborn ET feeding behavior and how to support that with supplementing and pumping.    LC offering to set up the pump and Mom agreeable.  LC provided Mom with 21 mm flanges and educated on setting the pump for initiation setting. Educated parents on how to wash, rinse and air dry parts in bins provided.  Plan recommended- 1- Keep baby STS as much as possible 2- Offer the breast with feeding cues 3- if baby unable to latch or sustain a latch for 10 mins, baby should be supplemented according to volume guidelines 4- Pump both breasts after breastfeeding to support a full milk supply. 5- ask for assistance prn  Judee Clara 03/09/2023, 3:21 PM

## 2023-03-09 NOTE — Anesthesia Postprocedure Evaluation (Signed)
Anesthesia Post Note  Patient: Rebecca FOUNTAINE  Procedure(s) Performed: AN AD HOC LABOR EPIDURAL     Patient location during evaluation: Mother Baby Anesthesia Type: Epidural Level of consciousness: awake and alert Pain management: pain level controlled Vital Signs Assessment: post-procedure vital signs reviewed and stable Respiratory status: spontaneous breathing, nonlabored ventilation and respiratory function stable Cardiovascular status: stable Postop Assessment: no headache, no backache, epidural receding and able to ambulate Anesthetic complications: no   No notable events documented.  Last Vitals:  Vitals:   03/09/23 1044 03/09/23 1153  BP: 124/71 127/74  Pulse: (!) 101 95  Resp: 17 18  Temp: 36.6 C 37 C  SpO2: 98% 99%    Last Pain:  Vitals:   03/09/23 1153  TempSrc: Oral  PainSc:    Pain Goal:                   Xayvier Vallez

## 2023-03-10 MED ORDER — FUROSEMIDE 20 MG PO TABS: 20.0000 mg | ORAL_TABLET | Freq: Every day | ORAL | 0 refills | Status: DC

## 2023-03-10 MED ORDER — IBUPROFEN 600 MG PO TABS: 600.0000 mg | ORAL_TABLET | Freq: Four times a day (QID) | ORAL | 0 refills | Status: DC

## 2023-03-10 MED ORDER — ACETAMINOPHEN 325 MG PO TABS: 650.0000 mg | ORAL_TABLET | ORAL | Status: DC | PRN

## 2023-03-10 NOTE — Plan of Care (Signed)

## 2023-03-13 ENCOUNTER — Telehealth: Payer: Self-pay | Admitting: *Deleted

## 2023-03-13 ENCOUNTER — Encounter: Payer: Medicaid Other | Admitting: Obstetrics & Gynecology

## 2023-03-13 ENCOUNTER — Other Ambulatory Visit: Payer: Medicaid Other

## 2023-03-13 NOTE — Telephone Encounter (Signed)
Leah PP nurse from [facility] called to report pt scored 4 on edinburgh. Pt denies thoughts of self harm. Will f/u at [facility] visit.

## 2023-03-14 ENCOUNTER — Encounter: Payer: Self-pay | Admitting: Obstetrics & Gynecology

## 2023-03-15 ENCOUNTER — Telehealth (INDEPENDENT_AMBULATORY_CARE_PROVIDER_SITE_OTHER): Payer: Medicaid Other | Admitting: *Deleted

## 2023-03-15 VITALS — BP 128/83

## 2023-03-15 DIAGNOSIS — Z0130 Encounter for examination of blood pressure without abnormal findings: Secondary | ICD-10-CM

## 2023-03-15 NOTE — Progress Notes (Signed)
   NURSE VISIT- BLOOD PRESSURE CHECK  I connected with [name] on 03/15/2023 by MyChart video and verified that I am speaking with the correct person using two identifiers.   I discussed the limitations of evaluation and management by telemedicine. The patient expressed understanding and agreed to proceed.  Nurse is at the office, and patient is at home.  SUBJECTIVE:  Rebecca Webster is a 42 y.o. [id number] female here for BP check. She is postpartum, delivery date 03/08/23     HYPERTENSION ROS:  Pregnant/postpartum:  Severe headaches that don't go away with tylenol/other medicines: No  Visual changes (seeing spots/double/blurred vision) No  Severe pain under right breast breast or in center of upper chest No  Severe nausea/vomiting No  Taking medicines as instructed not applicable    OBJECTIVE:  BP 128/83   LMP 06/21/2022 (Exact Date)   Breastfeeding Yes   Appearance alert, well appearing, and in no distress.  ASSESSMENT: Postpartum  blood pressure check  PLAN: Discussed with Dr. Charlotta Newton   Recommendations: no changes needed   Follow-up: as scheduled   Annamarie Dawley  03/15/2023 10:24 AM

## 2023-03-20 ENCOUNTER — Other Ambulatory Visit: Payer: Medicaid Other

## 2023-03-22 ENCOUNTER — Encounter: Payer: Medicaid Other | Admitting: Obstetrics & Gynecology

## 2023-04-18 ENCOUNTER — Ambulatory Visit: Payer: Medicaid Other | Admitting: Women's Health

## 2023-04-22 ENCOUNTER — Ambulatory Visit: Payer: Medicaid Other | Admitting: Advanced Practice Midwife

## 2023-04-30 ENCOUNTER — Ambulatory Visit: Payer: Medicaid Other | Admitting: Women's Health

## 2023-04-30 ENCOUNTER — Encounter: Payer: Self-pay | Admitting: Women's Health

## 2023-04-30 DIAGNOSIS — Z8742 Personal history of other diseases of the female genital tract: Secondary | ICD-10-CM | POA: Diagnosis not present

## 2023-04-30 DIAGNOSIS — Z8759 Personal history of other complications of pregnancy, childbirth and the puerperium: Secondary | ICD-10-CM | POA: Diagnosis not present

## 2023-04-30 DIAGNOSIS — Z98891 History of uterine scar from previous surgery: Secondary | ICD-10-CM | POA: Diagnosis not present

## 2023-04-30 MED ORDER — VITAFOL ULTRA 29-0.6-0.4-200 MG PO CAPS: 1.0000 | ORAL_CAPSULE | Freq: Every day | ORAL | 3 refills | Status: AC

## 2023-04-30 NOTE — Progress Notes (Signed)
POSTPARTUM VISIT Patient name: Rebecca Webster MRN [id number]  Date of birth: 08-02-1981 Chief Complaint:   Postpartum Care  History of Present Illness:   Rebecca Webster is a 42 y.o. [id number] African American female being seen today for a postpartum visit. She is 7 weeks postpartum following a VBAC at 37.1 gestational weeks. IOL: yes, for gestational hypertension . Anesthesia: epidural.  Laceration: none.  Complications: none. Inpatient contraception: no.   Pregnancy complicated by GHTN, cervical insufficiency w/ cerclage, AMA . Tobacco use: no. Substance use disorder: no. Last pap smear: 05/11/22 and results were NILM w/ HRHPV negative. Next pap smear due: 2026 Patient's last menstrual period was 04/16/2023 (exact date).  Postpartum course has been uncomplicated. D/C'd on lasix x 5d, home bp's have been normal. Was rushing in today. Bleeding none. Bowel function is normal. Bladder function is normal. Urinary incontinence? no, fecal incontinence? no Patient is not sexually active. Last sexual activity: prior to birth of baby. Desired contraception: partner has already had vasectomy, has f/u in 2wks. Patient does not want a pregnancy in the future.  Desired family size is 7 children.   Upstream - 04/30/23 1133       Pregnancy Intention Screening   Does the patient want to become pregnant in the next year? No    Does the patient's partner want to become pregnant in the next year? No    Would the patient like to discuss contraceptive options today? No      Contraception Wrap Up   Current Method Vasectomy    End Method Vasectomy    Contraception Counseling Provided No            The pregnancy intention screening data noted above was reviewed. Potential methods of contraception were discussed. The patient elected to proceed with Vasectomy.  Edinburgh Postpartum Depression Screening: negative  Edinburgh Postnatal Depression Scale - 04/30/23 1134       Edinburgh Postnatal  Depression Scale:  In the Past 7 Days   I have been able to laugh and see the funny side of things. 0    I have looked forward with enjoyment to things. 0    I have blamed myself unnecessarily when things went wrong. 0    I have been anxious or worried for no good reason. 0    I have felt scared or panicky for no good reason. 0    Things have been getting on top of me. 0    I have been so unhappy that I have had difficulty sleeping. 0    I have felt sad or miserable. 0    I have been so unhappy that I have been crying. 0    The thought of harming myself has occurred to me. 0    Edinburgh Postnatal Depression Scale Total 0                09/12/2022    1:43 PM  GAD 7 : Generalized Anxiety Score  Nervous, Anxious, on Edge 1  Control/stop worrying 0  Worry too much - different things 0  Trouble relaxing 0  Restless 0  Easily annoyed or irritable 0  Afraid - awful might happen 0  Total GAD 7 Score 1     Baby's course has been uncomplicated. Baby is feeding by breast: milk supply adequate. Infant has a pediatrician/family doctor? Yes.  Childcare strategy if returning to work/school: n/a-stay at home mom.  Pt has material needs met for her and baby:  Yes.   Review of Systems:   Pertinent items are noted in HPI Denies Abnormal vaginal discharge w/ itching/odor/irritation, headaches, visual changes, shortness of breath, chest pain, abdominal pain, severe nausea/vomiting, or problems with urination or bowel movements. Pertinent History Reviewed:  Reviewed past medical,surgical, obstetrical and family history.  Reviewed problem list, medications and allergies. OB History  Gravida Para Term Preterm AB Living  10 6 4 2 4 7   SAB IAB Ectopic Multiple Live Births  3   1 1 7     # Outcome Date GA Lbr Len/2nd Weight Sex Type Anes PTL Lv  10 Term 03/08/23 [redacted]w[redacted]d / 00:11 6 lb 2 oz (2.778 kg) M VBAC EPI  LIV  9 SAB 04/2022          8 SAB 03/04/21 [redacted]w[redacted]d         7 Preterm 03/02/20 [redacted]w[redacted]d  6 lb 9  oz (2.977 kg) F Vag-Spont  N LIV     Birth Comments: cerglage placed     Complications: Gestational hypertension  6 SAB 05/23/19 [redacted]w[redacted]d   M   N      Birth Comments: incompetent cervix     Complications: Preterm premature rupture of membranes  5 Ectopic 2019     ECTOPIC     4 Term 09/11/03 [redacted]w[redacted]d  6 lb 15 oz (3.147 kg) M VBAC EPI N LIV  3A Preterm 07/18/00 [redacted]w[redacted]d  3 lb 15 oz (1.786 kg) F CS-LTranv Spinal Y LIV     Complications: Breech presentation  3B Preterm 07/18/00 [redacted]w[redacted]d  4 lb 5 oz (1.956 kg) M CS-LTranv Spinal Y LIV     Complications: Breech presentation  2 Term 07/06/98 [redacted]w[redacted]d  8 lb (3.629 kg) F Vag-Spont EPI N LIV  1 Term 05/11/96 [redacted]w[redacted]d  8 lb 5 oz (3.771 kg) M Vag-Spont EPI N LIV   Physical Assessment:   Vitals:   04/30/23 1131 04/30/23 1148  BP: (!) 141/86 128/85  Pulse: 71 72  Weight: 245 lb (111.1 kg)   Height: 5\' 11"  (1.803 m)   Body mass index is 34.17 kg/m.       Physical Examination:   General appearance: alert, well appearing, and in no distress  Mental status: alert, oriented to person, place, and time  Skin: warm & dry   Cardiovascular: normal heart rate noted   Respiratory: normal respiratory effort, no distress   Breasts: deferred, no complaints   Abdomen: soft, non-tender   Pelvic: examination not indicated. Thin prep pap obtained: No  Rectal: not examined  Extremities: Edema: none   Chaperone: N/A         No results found for this or any previous visit (from the past 24 hour(s)).  Assessment & Plan:  1) Postpartum exam 2) 7 wks s/p vaginal birth after cesarean (VBAC) after IOL for GHTN 3) breast feeding 4) Depression screening 5) Contraception husband s/p vasectomy, plans to wait to have sex until after cleared, if before use condoms/pull out 6) 1st bp elevated> 2nd normal, was rushing to get to appt, home bp's wnl. Plans to establish care w/ PCP her husband goes to soon  Essential components of care per ACOG recommendations:  1.  Mood and well  being:  If positive depression screen, discussed and plan developed.  If using tobacco we discussed reduction/cessation and risk of relapse If current substance abuse, we discussed and referral to local resources was offered.   2. Infant care and feeding:  If breastfeeding, discussed returning to work, pumping,  breastfeeding-associated pain, guidance regarding return to fertility while lactating if not using another method. If needed, patient was provided with a letter to be allowed to pump q 2-3hrs to support lactation in a private location with access to a refrigerator to store breastmilk.   Recommended that all caregivers be immunized for flu, pertussis and other preventable communicable diseases If pt does not have material needs met for her/baby, referred to local resources for help obtaining these.  3. Sexuality, contraception and birth spacing Provided guidance regarding sexuality, management of dyspareunia, and resumption of intercourse Discussed avoiding interpregnancy interval <51mths and recommended birth spacing of 18 months  4. Sleep and fatigue Discussed coping options for fatigue and sleep disruption Encouraged family/partner/community support of 4 hrs of uninterrupted sleep to help with mood and fatigue  5. Physical recovery  If pt had a C/S, assessed incisional pain and providing guidance on normal vs prolonged recovery If pt had a laceration, perineal healing and pain reviewed.  If urinary or fecal incontinence, discussed management and referred to PT or uro/gyn if indicated  Patient is safe to resume physical activity. Discussed attainment of healthy weight.  6.  Chronic disease management Discussed pregnancy complications if any, and their implications for future childbearing and long-term maternal health. Review recommendations for prevention of recurrent pregnancy complications, such as 17 hydroxyprogesterone caproate to reduce risk for recurrent PTB not applicable, or  aspirin to reduce risk of preeclampsia: no more pregnancies planned. Pt had GDM: no. If yes, 2hr GTT scheduled: not applicable. Reviewed medications and non-pregnant dosing including consideration of whether pt is breastfeeding using a reliable resource such as [employer]act[employer]: not applicable Referred for f/u w/ PCP or subspecialist providers as indicated: yes  7. Health maintenance Mammogram at 42yo or earlier if indicated Pap smears as indicated  Meds:  Meds ordered this encounter  Medications   Prenat-Fe Poly-Methfol-FA-DHA (VITAFOL ULTRA) 29-0.6-0.4-200 MG CAPS    Sig: Take 1 capsule by mouth daily.    Dispense:  90 capsule    Refill:  3    Follow-up: Return in about 1 year (around 04/29/2024) for Physical.   No orders of the defined types were placed in this encounter.   Cheral Marker CNM, [facility] 04/30/2023 11:54 AM

## 2023-12-03 ENCOUNTER — Encounter: Payer: Self-pay | Admitting: Obstetrics & Gynecology

## 2023-12-03 MED ORDER — ENALAPRIL MALEATE 10 MG PO TABS: 10.0000 mg | ORAL_TABLET | Freq: Every day | ORAL | 2 refills | Status: AC

## 2024-09-29 ENCOUNTER — Ambulatory Visit: Admitting: Obstetrics & Gynecology
